# Patient Record
Sex: Male | Born: 1940 | Race: White | Hispanic: No | Marital: Married | Smoking: Never smoker
Health system: Southern US, Community
[De-identification: ages and names within clinical notes are randomized; demographics above are authoritative.]

## PROBLEM LIST (undated history)

## (undated) DIAGNOSIS — I10 Essential (primary) hypertension: Secondary | ICD-10-CM

## (undated) DIAGNOSIS — R209 Unspecified disturbances of skin sensation: Secondary | ICD-10-CM

## (undated) DIAGNOSIS — I69998 Other sequelae following unspecified cerebrovascular disease: Secondary | ICD-10-CM

## (undated) DIAGNOSIS — F411 Generalized anxiety disorder: Secondary | ICD-10-CM

## (undated) DIAGNOSIS — F3289 Other specified depressive episodes: Secondary | ICD-10-CM

## (undated) DIAGNOSIS — K219 Gastro-esophageal reflux disease without esophagitis: Secondary | ICD-10-CM

## (undated) DIAGNOSIS — J441 Chronic obstructive pulmonary disease with (acute) exacerbation: Secondary | ICD-10-CM

## (undated) DIAGNOSIS — E119 Type 2 diabetes mellitus without complications: Secondary | ICD-10-CM

## (undated) DIAGNOSIS — I639 Cerebral infarction, unspecified: Secondary | ICD-10-CM

## (undated) DIAGNOSIS — E1149 Type 2 diabetes mellitus with other diabetic neurological complication: Secondary | ICD-10-CM

## (undated) DIAGNOSIS — E785 Hyperlipidemia, unspecified: Secondary | ICD-10-CM

## (undated) DIAGNOSIS — F329 Major depressive disorder, single episode, unspecified: Secondary | ICD-10-CM

## (undated) DIAGNOSIS — I719 Aortic aneurysm of unspecified site, without rupture: Secondary | ICD-10-CM

## (undated) DIAGNOSIS — F0390 Unspecified dementia without behavioral disturbance: Secondary | ICD-10-CM

## (undated) DIAGNOSIS — H1030 Unspecified acute conjunctivitis, unspecified eye: Secondary | ICD-10-CM

## (undated) DIAGNOSIS — N189 Chronic kidney disease, unspecified: Secondary | ICD-10-CM

## (undated) DIAGNOSIS — G8100 Flaccid hemiplegia affecting unspecified side: Secondary | ICD-10-CM

## (undated) DIAGNOSIS — L89899 Pressure ulcer of other site, unspecified stage: Secondary | ICD-10-CM

## (undated) HISTORY — DX: Type 2 diabetes mellitus with other diabetic neurological complication: E11.49

---

## 2000-09-15 ENCOUNTER — Encounter: Payer: Self-pay | Admitting: Emergency Medicine

## 2000-09-15 ENCOUNTER — Inpatient Hospital Stay (HOSPITAL_COMMUNITY): Admission: EM | Admit: 2000-09-15 | Discharge: 2000-09-16 | Payer: Self-pay | Admitting: Emergency Medicine

## 2000-12-04 ENCOUNTER — Emergency Department (HOSPITAL_COMMUNITY): Admission: EM | Admit: 2000-12-04 | Discharge: 2000-12-04 | Payer: Self-pay | Admitting: *Deleted

## 2000-12-26 ENCOUNTER — Ambulatory Visit (HOSPITAL_COMMUNITY): Admission: RE | Admit: 2000-12-26 | Discharge: 2000-12-26 | Payer: Self-pay | Admitting: *Deleted

## 2000-12-26 ENCOUNTER — Encounter: Payer: Self-pay | Admitting: *Deleted

## 2001-01-06 ENCOUNTER — Encounter: Payer: Self-pay | Admitting: *Deleted

## 2001-01-08 ENCOUNTER — Encounter (INDEPENDENT_AMBULATORY_CARE_PROVIDER_SITE_OTHER): Payer: Self-pay | Admitting: *Deleted

## 2001-01-08 ENCOUNTER — Inpatient Hospital Stay (HOSPITAL_COMMUNITY): Admission: RE | Admit: 2001-01-08 | Discharge: 2001-01-09 | Payer: Self-pay | Admitting: *Deleted

## 2005-06-04 ENCOUNTER — Encounter (INDEPENDENT_AMBULATORY_CARE_PROVIDER_SITE_OTHER): Payer: Self-pay | Admitting: Interventional Cardiology

## 2005-06-04 ENCOUNTER — Inpatient Hospital Stay (HOSPITAL_COMMUNITY): Admission: EM | Admit: 2005-06-04 | Discharge: 2005-06-10 | Payer: Self-pay | Admitting: Emergency Medicine

## 2006-02-20 ENCOUNTER — Encounter: Payer: Self-pay | Admitting: Internal Medicine

## 2006-03-06 ENCOUNTER — Ambulatory Visit (HOSPITAL_COMMUNITY): Admission: RE | Admit: 2006-03-06 | Discharge: 2006-03-06 | Payer: Self-pay | Admitting: Interventional Radiology

## 2009-12-06 ENCOUNTER — Emergency Department (HOSPITAL_COMMUNITY): Admission: EM | Admit: 2009-12-06 | Discharge: 2009-12-06 | Payer: Self-pay | Admitting: Emergency Medicine

## 2009-12-06 ENCOUNTER — Other Ambulatory Visit: Payer: Self-pay | Admitting: Emergency Medicine

## 2010-03-17 ENCOUNTER — Ambulatory Visit (HOSPITAL_COMMUNITY)
Admission: RE | Admit: 2010-03-17 | Discharge: 2010-03-17 | Payer: Self-pay | Source: Home / Self Care | Attending: Internal Medicine | Admitting: Internal Medicine

## 2010-06-14 ENCOUNTER — Emergency Department (HOSPITAL_COMMUNITY): Payer: Medicare Other

## 2010-06-14 ENCOUNTER — Inpatient Hospital Stay (HOSPITAL_COMMUNITY)
Admission: EM | Admit: 2010-06-14 | Discharge: 2010-06-20 | DRG: 191 | Disposition: A | Discharge status: Skilled Nursing Facility | Payer: Medicare Other | Attending: Internal Medicine | Admitting: Internal Medicine

## 2010-06-14 ENCOUNTER — Inpatient Hospital Stay (HOSPITAL_COMMUNITY): Payer: Medicare Other

## 2010-06-14 DIAGNOSIS — M19019 Primary osteoarthritis, unspecified shoulder: Secondary | ICD-10-CM | POA: Diagnosis present

## 2010-06-14 DIAGNOSIS — I69959 Hemiplegia and hemiparesis following unspecified cerebrovascular disease affecting unspecified side: Secondary | ICD-10-CM

## 2010-06-14 DIAGNOSIS — E785 Hyperlipidemia, unspecified: Secondary | ICD-10-CM | POA: Diagnosis present

## 2010-06-14 DIAGNOSIS — J441 Chronic obstructive pulmonary disease with (acute) exacerbation: Principal | ICD-10-CM | POA: Diagnosis present

## 2010-06-14 DIAGNOSIS — R0902 Hypoxemia: Secondary | ICD-10-CM | POA: Diagnosis present

## 2010-06-14 DIAGNOSIS — F341 Dysthymic disorder: Secondary | ICD-10-CM | POA: Diagnosis present

## 2010-06-14 DIAGNOSIS — E119 Type 2 diabetes mellitus without complications: Secondary | ICD-10-CM | POA: Diagnosis present

## 2010-06-14 DIAGNOSIS — I10 Essential (primary) hypertension: Secondary | ICD-10-CM | POA: Diagnosis present

## 2010-06-14 LAB — BLOOD GAS, ARTERIAL
Acid-base deficit: 3.1 mmol/L — ABNORMAL HIGH (ref 0.0–2.0)
Drawn by: 307971
O2 Content: 4 L/min
O2 Saturation: 97.8 %
TCO2: 21.4 mmol/L (ref 0–100)
pCO2 arterial: 47.8 mmHg — ABNORMAL HIGH (ref 35.0–45.0)
pO2, Arterial: 103 mmHg — ABNORMAL HIGH (ref 80.0–100.0)

## 2010-06-14 LAB — DIFFERENTIAL
Eosinophils Absolute: 1.7 10*3/uL — ABNORMAL HIGH (ref 0.0–0.7)
Eosinophils Relative: 15 % — ABNORMAL HIGH (ref 0–5)
Lymphs Abs: 1.7 10*3/uL (ref 0.7–4.0)
Monocytes Absolute: 0.9 10*3/uL (ref 0.1–1.0)
Monocytes Relative: 8 % (ref 3–12)

## 2010-06-14 LAB — LIPID PANEL
HDL: 37 mg/dL — ABNORMAL LOW (ref >39)
LDL Cholesterol: 38 mg/dL (ref 0–99)
Triglycerides: 45 mg/dL (ref <150)
VLDL: 9 mg/dL (ref 0–40)

## 2010-06-14 LAB — POCT CARDIAC MARKERS
CKMB, poc: 2 ng/mL (ref 1.0–8.0)
Myoglobin, poc: 172 ng/mL (ref 12–200)

## 2010-06-14 LAB — BASIC METABOLIC PANEL
BUN: 25 mg/dL — ABNORMAL HIGH (ref 6–23)
Creatinine, Ser: 1.62 mg/dL — ABNORMAL HIGH (ref 0.4–1.5)
GFR calc non Af Amer: 42 mL/min — ABNORMAL LOW (ref >60)
Glucose, Bld: 97 mg/dL (ref 70–99)

## 2010-06-14 LAB — CBC
MCH: 27.3 pg (ref 26.0–34.0)
MCHC: 30.1 g/dL (ref 30.0–36.0)
MCV: 90.5 fL (ref 78.0–100.0)
Platelets: 158 10*3/uL (ref 150–400)
RDW: 14.2 % (ref 11.5–15.5)
WBC: 11.8 10*3/uL — ABNORMAL HIGH (ref 4.0–10.5)

## 2010-06-14 LAB — CARDIAC PANEL(CRET KIN+CKTOT+MB+TROPI): Relative Index: 5.9 — ABNORMAL HIGH (ref 0.0–2.5)

## 2010-06-14 LAB — CK TOTAL AND CKMB (NOT AT ARMC): Total CK: 166 U/L (ref 7–232)

## 2010-06-14 LAB — GLUCOSE, CAPILLARY
Glucose-Capillary: 170 mg/dL — ABNORMAL HIGH (ref 70–99)
Glucose-Capillary: 143 mg/dL — ABNORMAL HIGH (ref 70–99)

## 2010-06-14 LAB — HEMOGLOBIN A1C
Hgb A1c MFr Bld: 6.1 % — ABNORMAL HIGH (ref <5.7)
Mean Plasma Glucose: 128 mg/dL — ABNORMAL HIGH (ref <117)

## 2010-06-15 ENCOUNTER — Inpatient Hospital Stay (HOSPITAL_COMMUNITY): Payer: Medicare Other

## 2010-06-15 LAB — COMPREHENSIVE METABOLIC PANEL
AST: 27 U/L (ref 0–37)
Albumin: 3 g/dL — ABNORMAL LOW (ref 3.5–5.2)
BUN: 27 mg/dL — ABNORMAL HIGH (ref 6–23)
Chloride: 111 mEq/L (ref 96–112)
Creatinine, Ser: 1.39 mg/dL (ref 0.4–1.5)
GFR calc Af Amer: >60 mL/min (ref >60)
Total Bilirubin: 0.5 mg/dL (ref 0.3–1.2)
Total Protein: 5.9 g/dL — ABNORMAL LOW (ref 6.0–8.3)

## 2010-06-15 LAB — CBC
HCT: 32.5 % — ABNORMAL LOW (ref 39.0–52.0)
Hemoglobin: 9.6 g/dL — ABNORMAL LOW (ref 13.0–17.0)
RDW: 14.2 % (ref 11.5–15.5)
WBC: 18.2 10*3/uL — ABNORMAL HIGH (ref 4.0–10.5)

## 2010-06-15 LAB — GLUCOSE, CAPILLARY
Glucose-Capillary: 160 mg/dL — ABNORMAL HIGH (ref 70–99)
Glucose-Capillary: 139 mg/dL — ABNORMAL HIGH (ref 70–99)
Glucose-Capillary: 181 mg/dL — ABNORMAL HIGH (ref 70–99)

## 2010-06-15 LAB — PROTIME-INR
INR: 1.12 (ref 0.00–1.49)
Prothrombin Time: 14.6 seconds (ref 11.6–15.2)

## 2010-06-15 NOTE — H&P (Signed)
NAME:  Justin Sherman, Justin Sherman              ACCOUNT NO.:  [id number]  MEDICAL RECORD NO.:  [id number]           PATIENT TYPE:  E  LOCATION:  WLED                         FACILITY:  [facility]  PHYSICIAN:  Conley Canal, MD      DATE OF BIRTH:  06-02-1940  DATE OF ADMISSION:  06/14/2010 DATE OF DISCHARGE:                             HISTORY & PHYSICAL   PRIMARY CARE PHYSICIAN:  Lenon Curt. Chilton Si, MD  CHIEF COMPLAINT:  Shortness of breath.  HISTORY OF PRESENT ILLNESS:  [name] is a nursing home resident referred with a history of cough and shortness of breath.  The patient could not give me a meaningful history because of apparently CVA in the past, hence history is obtained from emergency room records as well as from skilled nursing facility records.  He is a 70 year old male with history of bronchial asthma, COPD, hypertension, diabetes mellitus type 2, anxiety, anemia, CVA, depression, hyperlipidemia, dysphagia, right hemiparesis related CVA, left carotid endarterectomy who comes in with history of cough and shortness of breath.  He denies any chest pain.  He could not elaborate on the history when he presented to the emergency room, he was tachycardic, heart rate 109 with rectal temperature 100.6. He was given Avelox and nebulizations and referred to hospitalist service for further management.  Per emergency room records, the patient was wheezing, hence also given Solu-Medrol.  PAST MEDICAL HISTORY: 1. Diabetes mellitus type 2. 2. Hypertension. 3. History of CVA with right-sided hemiparesis. 4. Anxiety, depression. 5. Bronchial asthma, COPD, hyperlipidemia, dysphagia.  SOCIAL HISTORY:  Nonsmoker, nondrinker and nursing home resident.  ALLERGIES:  No known drug allergies.  HOME MEDICATIONS:  Hydrogel, Celexa, Zocor, Lantus, aspirin, oxycodone, Tylenol, DuoNeb, Phenergan, Proteinex, thiamine, multivitamins.  REVIEW OF SYSTEMS:  Unremarkable except as highlighted in the history  of present illness.  FAMILY HISTORY:  The patient denies any history of chronic medical conditions.  REVIEW OF SYSTEMS:  Unremarkable except as highlighted in the history of present illness.  PHYSICAL EXAMINATION:  GENERAL:  This is a frail elderly male not in acute distress. VITAL SIGNS:  Blood pressure 124/78, heart rate 102, temperature 100.6, respirations 24, oxygen saturation is 99% on 2 liters nasal cannula. HEAD, EARS, NOSE AND THROAT:  Pupils equal reacting to light.  No jugular venous distention.  Old surgical scar, right cervical area. RESPIRATORY SYSTEM:  Reduced air entry bilaterally with scattered wheezing. CARDIOVASCULAR SYSTEM:  First and heart sounds heard.  No murmurs. Pulse regular. ABDOMEN:  Scaphoid, soft, nontender.  No palpable organomegaly.  Bowel sounds are normal. CNS:  The patient has speech garbled.  Follows commands. EXTREMITIES:  No pedal edema.  Peripheral pulses equal.  LABORATORY DATA:  Reviewed significant for WBC 11.8, hemoglobin 11.2, hematocrit 37.2, platelet count 158.  Sodium 140, potassium 4.2, BUN 25,creatinine 1.62.  Chest x-ray shows hyperinflation with no acute findings.  EKG shows sinus tachycardia with first-degree AV block and some left fascicular block.  IMPRESSION:  A 70 year old nursing home resident presenting with shortness of breath and cough with suggestion of chronic obstructive pulmonary disease exacerbation.  He could have early healthcare associated pneumonia marked  by dehydration who would then worry for nosocomial organisms.  PLAN: 1. Acute exacerbation of chronic obstructive pulmonary disease versus     healthcare-associated pneumonia.  We will admit the patient regular     medicine of bronchodilators, oxygen supplementation, systemic     steroids.  Meanwhile, we will place the patient on vancomycin and     Levaquin to cover for MRSA and Pseudomonas. 2. Hypertension.  The patient apparently not on medications.  We  will     start calcium-channel blocker. 3. Diabetes mellitus, seems well controlled.  We will place him on low-     dose Lantus and sliding-scale insulin.  Expect uncontrolled sugars     with steroids on board. 4. History of cerebrovascular accident.  We will consult Physical     Therapy. 5. Depression.  Plan to resume home medications once confirmed. 6. DVT, GI prophylaxis.  The patient's condition is guarded.     Conley Canal, MD     SR/MEDQ  D:  06/14/2010  T:  06/14/2010  Job:  [id number]  cc:   Lenon Curt Chilton Si, M.D. Fax: [phone]  Electronically Signed by Conley Canal  on 06/14/2010 07:19:03 PM

## 2010-06-16 LAB — CULTURE, BLOOD (ROUTINE X 2)

## 2010-06-16 LAB — BASIC METABOLIC PANEL
BUN: 34 mg/dL — ABNORMAL HIGH (ref 6–23)
Calcium: 9.1 mg/dL (ref 8.4–10.5)
Creatinine, Ser: 1.46 mg/dL (ref 0.4–1.5)
GFR calc Af Amer: 57 mL/min — ABNORMAL LOW (ref >60)

## 2010-06-16 LAB — GLUCOSE, CAPILLARY
Glucose-Capillary: 146 mg/dL — ABNORMAL HIGH (ref 70–99)
Glucose-Capillary: 190 mg/dL — ABNORMAL HIGH (ref 70–99)

## 2010-06-17 LAB — BASIC METABOLIC PANEL
BUN: 41 mg/dL — ABNORMAL HIGH (ref 6–23)
Calcium: 8.9 mg/dL (ref 8.4–10.5)
GFR calc non Af Amer: 47 mL/min — ABNORMAL LOW (ref >60)
Glucose, Bld: 158 mg/dL — ABNORMAL HIGH (ref 70–99)
Potassium: 3.7 mEq/L (ref 3.5–5.1)
Sodium: 139 mEq/L (ref 135–145)

## 2010-06-17 LAB — VANCOMYCIN, TROUGH
Vancomycin Tr: 19.5 ug/mL (ref 10.0–20.0)
Vancomycin Tr: 22.4 ug/mL — ABNORMAL HIGH (ref 10.0–20.0)

## 2010-06-17 LAB — GLUCOSE, CAPILLARY: Glucose-Capillary: 188 mg/dL — ABNORMAL HIGH (ref 70–99)

## 2010-06-18 LAB — GLUCOSE, CAPILLARY
Glucose-Capillary: 184 mg/dL — ABNORMAL HIGH (ref 70–99)
Glucose-Capillary: 157 mg/dL — ABNORMAL HIGH (ref 70–99)
Glucose-Capillary: 239 mg/dL — ABNORMAL HIGH (ref 70–99)
Glucose-Capillary: 166 mg/dL — ABNORMAL HIGH (ref 70–99)
Glucose-Capillary: 172 mg/dL — ABNORMAL HIGH (ref 70–99)

## 2010-06-18 LAB — BASIC METABOLIC PANEL
Chloride: 108 mEq/L (ref 96–112)
GFR calc non Af Amer: 52 mL/min — ABNORMAL LOW (ref >60)
Potassium: 4.3 mEq/L (ref 3.5–5.1)
Sodium: 139 mEq/L (ref 135–145)

## 2010-06-19 LAB — GLUCOSE, CAPILLARY: Glucose-Capillary: 190 mg/dL — ABNORMAL HIGH (ref 70–99)

## 2010-06-20 LAB — CBC
Hemoglobin: 10.4 g/dL — ABNORMAL LOW (ref 13.0–17.0)
MCHC: 30.4 g/dL (ref 30.0–36.0)
RBC: 3.87 MIL/uL — ABNORMAL LOW (ref 4.22–5.81)
WBC: 12.2 10*3/uL — ABNORMAL HIGH (ref 4.0–10.5)

## 2010-06-20 LAB — GLUCOSE, CAPILLARY
Glucose-Capillary: 135 mg/dL — ABNORMAL HIGH (ref 70–99)
Glucose-Capillary: 173 mg/dL — ABNORMAL HIGH (ref 70–99)

## 2010-06-20 LAB — CULTURE, BLOOD (ROUTINE X 2)
Culture  Setup Time: 201203070421
Culture: NO GROWTH

## 2010-06-22 LAB — POCT I-STAT, CHEM 8
Calcium, Ion: 1.29 mmol/L (ref 1.12–1.32)
Glucose, Bld: 125 mg/dL — ABNORMAL HIGH (ref 70–99)
HCT: 38 % — ABNORMAL LOW (ref 39.0–52.0)
Hemoglobin: 12.9 g/dL — ABNORMAL LOW (ref 13.0–17.0)
Potassium: 3.8 mEq/L (ref 3.5–5.1)

## 2010-06-22 LAB — CBC
MCV: 91 fL (ref 78.0–100.0)
Platelets: 162 10*3/uL (ref 150–400)
RDW: 13.8 % (ref 11.5–15.5)
WBC: 10.6 10*3/uL — ABNORMAL HIGH (ref 4.0–10.5)

## 2010-06-22 LAB — POCT CARDIAC MARKERS
CKMB, poc: 1 ng/mL (ref 1.0–8.0)
Troponin i, poc: <0.05 ng/mL (ref 0.00–0.09)

## 2010-06-22 LAB — DIFFERENTIAL
Basophils Absolute: 0.1 10*3/uL (ref 0.0–0.1)
Eosinophils Absolute: 1 10*3/uL — ABNORMAL HIGH (ref 0.0–0.7)
Eosinophils Relative: 9 % — ABNORMAL HIGH (ref 0–5)
Lymphocytes Relative: 24 % (ref 12–46)
Neutrophils Relative %: 60 % (ref 43–77)

## 2010-06-22 LAB — PROTIME-INR: Prothrombin Time: 12.1 seconds (ref 11.6–15.2)

## 2010-07-06 NOTE — Discharge Summary (Signed)
Justin Sherman, Justin Sherman              ACCOUNT NO.:  [id number]  MEDICAL RECORD NO.:  [id number]           PATIENT TYPE:  I  LOCATION:  1414                         FACILITY:  [facility]  PHYSICIAN:  Kela Millin, M.D.DATE OF BIRTH:  [birth year]  DATE OF ADMISSION:  06/14/2010 DATE OF DISCHARGE:  06/20/2010                        DISCHARGE SUMMARY - REFERRING   DISCHARGE DIAGNOSES: 1. Chronic obstructive pulmonary disease exacerbation. 2. Anxiety/depression. 3. Hypertension. 4. Diabetes mellitus. 5. History of cerebrovascular accident. 6. 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 7. Hyperlipidemia. 8. History of bronchial asthma. 9. History of dysphagia.  PROCEDURES AND STUDIES: 1. Chest x-ray on June 14, 2010 - no acute findings. 2. Followup chest x-ray on June 14, 2010 - no active disease as one     view. 3. Chest x-ray on June 15, 2010 - no acute cardiopulmonary process.  CONSULTATIONS:  None.  BRIEF HISTORY:  The patient is a pleasant 70 year old white male nursing home resident who presented with complaints of cough and shortness of breath.  The history was obtained from chart review as the patient was not able to give a good history due to his CVA in the past.  Per ED records, upon arrival he was short of breath and tachycardic and he was given nebulized bronchodilators.  He was also found to be wheezing on exam and he was started on IV Solu-Medrol.  Chest x-ray was done and the result as stated above with no acute infiltrates and he was admitted for further evaluation and management.  HOSPITAL COURSE BY PROBLEMS: 1. COPD exacerbation.  Upon admission, the patient was started on     nebulized bronchodilators as well as IV steroids and antibiotics as     well as supplemental oxygen.  His symptoms were slow to respond and     the patient also had several episodes of anxiety attacks that seem     to precipitate his episodes of shortness of breath.  He was  maintained on IV Solu-Medrol along with bronchodilators and     antibiotics and low-dose Ativan was added and gradually his     symptoms improved.  He has remained afebrile.  His white cell count     today is 12.2 and the steroids he is on is probably contributing to     this.  He was changed to oral steroids and his symptoms have     continued to improve and he will be discharged at this time on oral     antibiotics along with a prednisone taper and he is to continue     supplemental oxygen upon discharge and follow up with the nursing     home physician. 2. Anxiety/depression.  He was maintained on Celexa.  His dose was     decreased to 20 mg daily per FDA recommendations and was also     placed on low-dose Ativan p.r.n.  He is to continue this upon     discharge. 3. Diabetes mellitus.  His Accu-Cheks were monitored and he was     covered with sliding scale insulin as well and he was also on  Lantus.  He is to continue the Lantus upon discharge. 4. Hypertension.  He was placed on Norvasc during this hospital stay     and is to continue it upon discharge. 5. History of CVA.  PT/OT was consulted and followed the patient in     the hospital and they recommended for him to continue rehab at the     nursing facility upon discharge.  DISCHARGE MEDICATIONS: 1. Norvasc 5 mg p.o. daily. 2. Mucinex 1 tablet p.o. b.i.d. 3. Levaquin 1 p.o. daily for 3 more days. 4. Protonix 40 mg p.o. daily. 5. Prednisone taper as directed. 6. Ambien 5 mg q.h.s. p.r.n. 7. Celexa 20 mg p.o. q.h.s. 8. Ipratropium/albuterol nebs q.6 h and q.3-4 h p.r.n. 9. Lantus 23 units subcu q.h.s. 10.Multivitamin 1 p.o. daily. 11.Oxycodone 5 mg 2 tablets q.4 h p.r.n. as previously and 5 mg 2     tablets q. a.m. as previously. 12.Phenergan 25 mg q.6 h p.r.n. 13.Proteinex 30 cc p.o. daily. 14.Thiamine 100 mg p.o. daily. 15.Tylenol 2 tablets q.6 h p.r.n. 16.Zocor 40 mg p.o. q.h.s.  FOLLOWUP CARE:  Nursing home  physician in 1 to 2 days.     Kela Millin, M.D.     ACV/MEDQ  D:  06/20/2010  T:  06/20/2010  Job:  [id number]  cc:   Lenon Curt. Chilton Si, M.D. Fax: [phone]  Electronically Signed by Donnalee Curry M.D. on 07/06/2010 10:37:25 AM

## 2010-07-11 ENCOUNTER — Inpatient Hospital Stay (HOSPITAL_COMMUNITY)
Admission: EM | Admit: 2010-07-11 | Discharge: 2010-07-16 | DRG: 871 | Disposition: A | Discharge status: Skilled Nursing Facility | Payer: Medicare Other | Attending: Internal Medicine | Admitting: Internal Medicine

## 2010-07-11 ENCOUNTER — Emergency Department (HOSPITAL_COMMUNITY): Payer: Medicare Other

## 2010-07-11 ENCOUNTER — Other Ambulatory Visit (HOSPITAL_COMMUNITY): Payer: Medicare Other

## 2010-07-11 ENCOUNTER — Inpatient Hospital Stay (HOSPITAL_COMMUNITY): Payer: Medicare Other

## 2010-07-11 DIAGNOSIS — N390 Urinary tract infection, site not specified: Secondary | ICD-10-CM | POA: Diagnosis present

## 2010-07-11 DIAGNOSIS — E119 Type 2 diabetes mellitus without complications: Secondary | ICD-10-CM | POA: Diagnosis present

## 2010-07-11 DIAGNOSIS — N179 Acute kidney failure, unspecified: Secondary | ICD-10-CM | POA: Diagnosis present

## 2010-07-11 DIAGNOSIS — Z7982 Long term (current) use of aspirin: Secondary | ICD-10-CM

## 2010-07-11 DIAGNOSIS — J13 Pneumonia due to Streptococcus pneumoniae: Secondary | ICD-10-CM

## 2010-07-11 DIAGNOSIS — I69959 Hemiplegia and hemiparesis following unspecified cerebrovascular disease affecting unspecified side: Secondary | ICD-10-CM

## 2010-07-11 DIAGNOSIS — I129 Hypertensive chronic kidney disease with stage 1 through stage 4 chronic kidney disease, or unspecified chronic kidney disease: Secondary | ICD-10-CM | POA: Diagnosis present

## 2010-07-11 DIAGNOSIS — D649 Anemia, unspecified: Secondary | ICD-10-CM | POA: Diagnosis present

## 2010-07-11 DIAGNOSIS — B964 Proteus (mirabilis) (morganii) as the cause of diseases classified elsewhere: Secondary | ICD-10-CM | POA: Diagnosis present

## 2010-07-11 DIAGNOSIS — J9600 Acute respiratory failure, unspecified whether with hypoxia or hypercapnia: Secondary | ICD-10-CM | POA: Diagnosis present

## 2010-07-11 DIAGNOSIS — R6521 Severe sepsis with septic shock: Secondary | ICD-10-CM

## 2010-07-11 DIAGNOSIS — A419 Sepsis, unspecified organism: Secondary | ICD-10-CM

## 2010-07-11 DIAGNOSIS — N139 Obstructive and reflux uropathy, unspecified: Secondary | ICD-10-CM | POA: Diagnosis present

## 2010-07-11 DIAGNOSIS — D696 Thrombocytopenia, unspecified: Secondary | ICD-10-CM | POA: Diagnosis present

## 2010-07-11 DIAGNOSIS — R1310 Dysphagia, unspecified: Secondary | ICD-10-CM | POA: Diagnosis present

## 2010-07-11 DIAGNOSIS — I499 Cardiac arrhythmia, unspecified: Secondary | ICD-10-CM | POA: Diagnosis not present

## 2010-07-11 DIAGNOSIS — E46 Unspecified protein-calorie malnutrition: Secondary | ICD-10-CM | POA: Diagnosis not present

## 2010-07-11 DIAGNOSIS — IMO0002 Reserved for concepts with insufficient information to code with codable children: Secondary | ICD-10-CM | POA: Diagnosis present

## 2010-07-11 DIAGNOSIS — R7881 Bacteremia: Secondary | ICD-10-CM | POA: Diagnosis present

## 2010-07-11 DIAGNOSIS — N182 Chronic kidney disease, stage 2 (mild): Secondary | ICD-10-CM | POA: Diagnosis present

## 2010-07-11 DIAGNOSIS — J441 Chronic obstructive pulmonary disease with (acute) exacerbation: Secondary | ICD-10-CM | POA: Diagnosis not present

## 2010-07-11 DIAGNOSIS — R6520 Severe sepsis without septic shock: Secondary | ICD-10-CM

## 2010-07-11 DIAGNOSIS — E785 Hyperlipidemia, unspecified: Secondary | ICD-10-CM | POA: Diagnosis present

## 2010-07-11 DIAGNOSIS — Z794 Long term (current) use of insulin: Secondary | ICD-10-CM

## 2010-07-11 LAB — CK TOTAL AND CKMB (NOT AT ARMC)
CK, MB: 3.5 ng/mL (ref 0.3–4.0)
Relative Index: 0.8 (ref 0.0–2.5)
Total CK: 428 U/L — ABNORMAL HIGH (ref 7–232)

## 2010-07-11 LAB — POCT I-STAT 3, ART BLOOD GAS (G3+)
TCO2: 19 mmol/L (ref 0–100)
pCO2 arterial: 32.3 mmHg — ABNORMAL LOW (ref 35.0–45.0)
pH, Arterial: 7.36 (ref 7.350–7.450)

## 2010-07-11 LAB — BASIC METABOLIC PANEL
CO2: 19 mEq/L (ref 19–32)
Calcium: 7.4 mg/dL — ABNORMAL LOW (ref 8.4–10.5)
Chloride: 111 mEq/L (ref 96–112)
Creatinine, Ser: 3.05 mg/dL — ABNORMAL HIGH (ref 0.4–1.5)
Creatinine, Ser: 2.88 mg/dL — ABNORMAL HIGH (ref 0.4–1.5)
GFR calc Af Amer: 25 mL/min — ABNORMAL LOW (ref >60)
GFR calc Af Amer: 26 mL/min — ABNORMAL LOW (ref >60)
GFR calc non Af Amer: 20 mL/min — ABNORMAL LOW (ref >60)
Glucose, Bld: 96 mg/dL (ref 70–99)
Sodium: 136 mEq/L (ref 135–145)

## 2010-07-11 LAB — HAPTOGLOBIN: Haptoglobin: 266 mg/dL — ABNORMAL HIGH (ref 16–200)

## 2010-07-11 LAB — COMPREHENSIVE METABOLIC PANEL
ALT: 26 U/L (ref 0–53)
AST: 32 U/L (ref 0–37)
CO2: 21 mEq/L (ref 19–32)
Calcium: 8.5 mg/dL (ref 8.4–10.5)
Chloride: 101 mEq/L (ref 96–112)
GFR calc Af Amer: 23 mL/min — ABNORMAL LOW (ref >60)
GFR calc non Af Amer: 19 mL/min — ABNORMAL LOW (ref >60)
Sodium: 133 mEq/L — ABNORMAL LOW (ref 135–145)

## 2010-07-11 LAB — URINE MICROSCOPIC-ADD ON

## 2010-07-11 LAB — URINALYSIS, ROUTINE W REFLEX MICROSCOPIC
Bilirubin Urine: NEGATIVE
Protein, ur: 100 mg/dL — AB
Urobilinogen, UA: 0.2 mg/dL (ref 0.0–1.0)

## 2010-07-11 LAB — DIFFERENTIAL
Basophils Absolute: 0 10*3/uL (ref 0.0–0.1)
Lymphocytes Relative: 4 % — ABNORMAL LOW (ref 12–46)
Neutro Abs: 9.7 10*3/uL — ABNORMAL HIGH (ref 1.7–7.7)
Neutrophils Relative %: 90 % — ABNORMAL HIGH (ref 43–77)

## 2010-07-11 LAB — CARDIAC PANEL(CRET KIN+CKTOT+MB+TROPI)
CK, MB: 4.3 ng/mL — ABNORMAL HIGH (ref 0.3–4.0)
Total CK: 476 U/L — ABNORMAL HIGH (ref 7–232)

## 2010-07-11 LAB — APTT: aPTT: 39 seconds — ABNORMAL HIGH (ref 24–37)

## 2010-07-11 LAB — PHOSPHORUS: Phosphorus: 2.9 mg/dL (ref 2.3–4.6)

## 2010-07-11 LAB — CBC
HCT: 29.7 % — ABNORMAL LOW (ref 39.0–52.0)
Hemoglobin: 9.7 g/dL — ABNORMAL LOW (ref 13.0–17.0)
Hemoglobin: 8.8 g/dL — ABNORMAL LOW (ref 13.0–17.0)
RBC: 3.16 MIL/uL — ABNORMAL LOW (ref 4.22–5.81)
RDW: 16.3 % — ABNORMAL HIGH (ref 11.5–15.5)
WBC: 10.7 10*3/uL — ABNORMAL HIGH (ref 4.0–10.5)

## 2010-07-11 LAB — LACTATE DEHYDROGENASE
LDH: 173 U/L (ref 94–250)
LDH: 183 U/L (ref 94–250)

## 2010-07-11 LAB — TECHNOLOGIST SMEAR REVIEW

## 2010-07-11 LAB — GLUCOSE, CAPILLARY
Glucose-Capillary: 114 mg/dL — ABNORMAL HIGH (ref 70–99)
Glucose-Capillary: 189 mg/dL — ABNORMAL HIGH (ref 70–99)

## 2010-07-11 LAB — CARBOXYHEMOGLOBIN
Carboxyhemoglobin: 1.1 % (ref 0.5–1.5)
Methemoglobin: 0.5 % (ref 0.0–1.5)

## 2010-07-11 LAB — LACTIC ACID, PLASMA: Lactic Acid, Venous: 3.8 mmol/L — ABNORMAL HIGH (ref 0.5–2.2)

## 2010-07-11 LAB — D-DIMER, QUANTITATIVE
D-Dimer, Quant: 3.11 ug/mL-FEU — ABNORMAL HIGH (ref 0.00–0.48)
D-Dimer, Quant: 5.23 ug/mL-FEU — ABNORMAL HIGH (ref 0.00–0.48)

## 2010-07-11 LAB — PROTIME-INR
INR: 1.39 (ref 0.00–1.49)
Prothrombin Time: 17.3 seconds — ABNORMAL HIGH (ref 11.6–15.2)

## 2010-07-11 LAB — ABO/RH: ABO/RH(D): O POS

## 2010-07-12 ENCOUNTER — Inpatient Hospital Stay (HOSPITAL_COMMUNITY): Payer: Medicare Other

## 2010-07-12 DIAGNOSIS — N179 Acute kidney failure, unspecified: Secondary | ICD-10-CM

## 2010-07-12 DIAGNOSIS — R6521 Severe sepsis with septic shock: Secondary | ICD-10-CM

## 2010-07-12 DIAGNOSIS — J13 Pneumonia due to Streptococcus pneumoniae: Secondary | ICD-10-CM

## 2010-07-12 DIAGNOSIS — A419 Sepsis, unspecified organism: Secondary | ICD-10-CM

## 2010-07-12 LAB — GLUCOSE, CAPILLARY
Glucose-Capillary: 201 mg/dL — ABNORMAL HIGH (ref 70–99)
Glucose-Capillary: 175 mg/dL — ABNORMAL HIGH (ref 70–99)
Glucose-Capillary: 205 mg/dL — ABNORMAL HIGH (ref 70–99)
Glucose-Capillary: 213 mg/dL — ABNORMAL HIGH (ref 70–99)
Glucose-Capillary: 322 mg/dL — ABNORMAL HIGH (ref 70–99)

## 2010-07-12 LAB — DIFFERENTIAL
Eosinophils Absolute: 0 10*3/uL (ref 0.0–0.7)
Lymphocytes Relative: 3 % — ABNORMAL LOW (ref 12–46)
Lymphs Abs: 0.6 10*3/uL — ABNORMAL LOW (ref 0.7–4.0)
Monocytes Relative: 3 % (ref 3–12)
Neutrophils Relative %: 94 % — ABNORMAL HIGH (ref 43–77)

## 2010-07-12 LAB — RENAL FUNCTION PANEL
Albumin: 2.1 g/dL — ABNORMAL LOW (ref 3.5–5.2)
BUN: 35 mg/dL — ABNORMAL HIGH (ref 6–23)
CO2: 22 mEq/L (ref 19–32)
Chloride: 112 mEq/L (ref 96–112)
Creatinine, Ser: 2.32 mg/dL — ABNORMAL HIGH (ref 0.4–1.5)
Glucose, Bld: 204 mg/dL — ABNORMAL HIGH (ref 70–99)

## 2010-07-12 LAB — BASIC METABOLIC PANEL
BUN: 35 mg/dL — ABNORMAL HIGH (ref 6–23)
CO2: 22 mEq/L (ref 19–32)
CO2: 22 mEq/L (ref 19–32)
Chloride: 113 mEq/L — ABNORMAL HIGH (ref 96–112)
Chloride: 114 mEq/L — ABNORMAL HIGH (ref 96–112)
Creatinine, Ser: 2.52 mg/dL — ABNORMAL HIGH (ref 0.4–1.5)
GFR calc Af Amer: 37 mL/min — ABNORMAL LOW (ref >60)
Glucose, Bld: 180 mg/dL — ABNORMAL HIGH (ref 70–99)
Potassium: 3.8 mEq/L (ref 3.5–5.1)
Sodium: 142 mEq/L (ref 135–145)

## 2010-07-12 LAB — CBC
HCT: 26.9 % — ABNORMAL LOW (ref 39.0–52.0)
MCH: 27.9 pg (ref 26.0–34.0)
MCV: 85.4 fL (ref 78.0–100.0)
Platelets: 56 10*3/uL — ABNORMAL LOW (ref 150–400)
RBC: 3.15 MIL/uL — ABNORMAL LOW (ref 4.22–5.81)

## 2010-07-13 ENCOUNTER — Inpatient Hospital Stay (HOSPITAL_COMMUNITY): Payer: Medicare Other

## 2010-07-13 LAB — CBC
HCT: 24.6 % — ABNORMAL LOW (ref 39.0–52.0)
Hemoglobin: 8.1 g/dL — ABNORMAL LOW (ref 13.0–17.0)
MCH: 27.8 pg (ref 26.0–34.0)
MCHC: 32.9 g/dL (ref 30.0–36.0)
RDW: 17.1 % — ABNORMAL HIGH (ref 11.5–15.5)

## 2010-07-13 LAB — CARDIAC PANEL(CRET KIN+CKTOT+MB+TROPI)
CK, MB: 2.6 ng/mL (ref 0.3–4.0)
Relative Index: INVALID (ref 0.0–2.5)
Total CK: 56 U/L (ref 7–232)
Troponin I: 0.04 ng/mL (ref 0.00–0.06)

## 2010-07-13 LAB — BASIC METABOLIC PANEL
CO2: 23 mEq/L (ref 19–32)
Calcium: 8.2 mg/dL — ABNORMAL LOW (ref 8.4–10.5)
Creatinine, Ser: 1.95 mg/dL — ABNORMAL HIGH (ref 0.4–1.5)
GFR calc non Af Amer: 34 mL/min — ABNORMAL LOW (ref >60)
Glucose, Bld: 241 mg/dL — ABNORMAL HIGH (ref 70–99)
Sodium: 140 mEq/L (ref 135–145)

## 2010-07-13 LAB — GLUCOSE, CAPILLARY
Glucose-Capillary: 195 mg/dL — ABNORMAL HIGH (ref 70–99)
Glucose-Capillary: 158 mg/dL — ABNORMAL HIGH (ref 70–99)

## 2010-07-14 LAB — BASIC METABOLIC PANEL
CO2: 23 mEq/L (ref 19–32)
Calcium: 8.7 mg/dL (ref 8.4–10.5)
Chloride: 110 mEq/L (ref 96–112)
GFR calc Af Amer: 47 mL/min — ABNORMAL LOW (ref >60)
Glucose, Bld: 85 mg/dL (ref 70–99)
Potassium: 4.1 mEq/L (ref 3.5–5.1)
Sodium: 137 mEq/L (ref 135–145)

## 2010-07-14 LAB — CARDIAC PANEL(CRET KIN+CKTOT+MB+TROPI)
CK, MB: 2.8 ng/mL (ref 0.3–4.0)
CK, MB: 2.1 ng/mL (ref 0.3–4.0)
Relative Index: INVALID (ref 0.0–2.5)
Total CK: 56 U/L (ref 7–232)
Troponin I: 0.04 ng/mL (ref 0.00–0.06)

## 2010-07-14 LAB — CULTURE, BLOOD (ROUTINE X 2): Culture  Setup Time: 201204030903

## 2010-07-14 LAB — GLUCOSE, CAPILLARY: Glucose-Capillary: 137 mg/dL — ABNORMAL HIGH (ref 70–99)

## 2010-07-14 LAB — URINE CULTURE
Colony Count: 1000
Culture  Setup Time: 201204032036

## 2010-07-14 LAB — CBC
HCT: 25.9 % — ABNORMAL LOW (ref 39.0–52.0)
Hemoglobin: 8.5 g/dL — ABNORMAL LOW (ref 13.0–17.0)
MCHC: 32.8 g/dL (ref 30.0–36.0)
RBC: 3.06 MIL/uL — ABNORMAL LOW (ref 4.22–5.81)
WBC: 12 10*3/uL — ABNORMAL HIGH (ref 4.0–10.5)

## 2010-07-14 LAB — VITAMIN B12: Vitamin B-12: 749 pg/mL (ref 211–911)

## 2010-07-14 LAB — IRON AND TIBC: UIBC: 148 ug/dL

## 2010-07-14 LAB — FOLATE: Folate: 12.3 ng/mL

## 2010-07-15 LAB — CBC
Hemoglobin: 8.4 g/dL — ABNORMAL LOW (ref 13.0–17.0)
MCH: 27.4 pg (ref 26.0–34.0)
MCHC: 32.6 g/dL (ref 30.0–36.0)
MCV: 84 fL (ref 78.0–100.0)
RBC: 3.07 MIL/uL — ABNORMAL LOW (ref 4.22–5.81)

## 2010-07-15 LAB — BASIC METABOLIC PANEL
BUN: 23 mg/dL (ref 6–23)
CO2: 25 mEq/L (ref 19–32)
Calcium: 8.9 mg/dL (ref 8.4–10.5)
Chloride: 103 mEq/L (ref 96–112)
GFR calc Af Amer: 49 mL/min — ABNORMAL LOW (ref >60)
GFR calc non Af Amer: 40 mL/min — ABNORMAL LOW (ref >60)
Glucose, Bld: 96 mg/dL (ref 70–99)

## 2010-07-15 LAB — GLUCOSE, CAPILLARY
Glucose-Capillary: 97 mg/dL (ref 70–99)
Glucose-Capillary: 119 mg/dL — ABNORMAL HIGH (ref 70–99)

## 2010-07-16 LAB — GLUCOSE, CAPILLARY: Glucose-Capillary: 110 mg/dL — ABNORMAL HIGH (ref 70–99)

## 2010-07-17 NOTE — Discharge Summary (Signed)
NAME:  Justin Sherman, Justin Sherman NO.:  [id number]  MEDICAL RECORD NO.:  [id number]           PATIENT TYPE:  I  LOCATION:  4743                         FACILITY:  [facility]  PHYSICIAN:  Andreas Blower, MD       DATE OF BIRTH:  1940-10-22  DATE OF ADMISSION:  07/11/2010 DATE OF DISCHARGE:                        DISCHARGE SUMMARY - REFERRING   PRIMARY CARE PHYSICIAN:  Dr. Chilton Si.  DISCHARGE DIAGNOSES: 1. Sepsis from urinary tract infection. 2. Proteus mirabilis infection urinary tract infection. 3. Proteus mirabilis bactermia 4. Thrombocytopenia. 5. Acute renal failure and chronic kidney disease stage II due to     obstructive uropathy, resolved. 6. Urethral stricture and obstructive uropathy status post urethral     dilatation. 7. Acute respiratory distress from sepsis, resolved. 8. Diabetes. 9. Anemia. 10. Hyperlipidemia. 11.History of cerebrovascular accident with right-sided deficits. 12.History of dysphagia for cerebrovascular accident, stable. 13.Hypertension. 14.Few episodes of arrhythmia to suggest possible Mobitz type 2.  DISCHARGE MEDICATIONS: 1. Cefuroxime 500 mg p.o. twice daily to be continued until July 21, 2010. 2. NovoLog sliding scale 1-15 units subcu 3 times a day with meals. 3. Lantus 5 units subcu daily at bedtime. 4. Aspirin 81 mg p.o. daily 5. Celexa 20 mg p.o. daily at bedtime. 6. Guaifenesin XR 600 mg p.o. twice daily. 7. Ipratropium and albuterol nebulizer every 6 hours. 8. Lorazepam 0.5 mg daily as needed. 9. Multivitamin 1 tablet p.o. daily. 10.Neosporin topical 1 application twice daily as needed. 11.Oxycodone 20 mg every morning and oxycodone 10 mg every 4 hours as     needed. 12.Pantoprazole 40 mg p.o. daily. 13.Phenergan 25 mg every 6 hours as needed. 14.Amino acids protein hydrolysate 30 mL p.o. daily. 15.Thiamine B1 100 mg 1 tablet p.o. daily. 16.Acetaminophen 650 mg every 6 hours as needed. 17.Simvastatin 40 mg daily at  bedtime. 18.Zolpidem 5 mg daily at bedtime as needed. 19.The following medications were discontinued Lantus 23 units subcu     daily at bedtime, amlodipine 5 mg p.o. daily.  BRIEF ADMITTING HISTORY AND PHYSICAL:  Mr. Brayboy is a 70 year old gentleman with history of CVA who presented on July 11, 2010, with severe sepsis, urinary tract infections and respiratory failure.  RADIOLOGY/IMAGING:  The patient had portable chest x-ray on July 11, 2010, which showed linear atelectasis in the right midlung with some elevation of the right hemidiaphragm. The patient had a head CT without contrast which shows chronic left MCA territory infarct.  No acute intracranial findings.  The patient had a renal ultrasound on July 11, 2010, which shows no acute findings, nephrolithiasis or renal vascular calcifications noted. The patient had another portable chest x-ray on July 13, 2010, which showed interstitial edema.  CONSULTATIONS:  The patient was initially on pulmonary critical care service prior to being transferred to the hospitalist service.  Urology, Dr. [name] was consulted.  LABORATORY DATA:  CBC shows a white count of 9.9, hemoglobin 8.4, hematocrit 25.8, platelet count 104, electrolytes normal with a creatinine of 1.69.  Troponin was negative x3.  Serum iron was 76, TIBC was 224, percent saturation 34, UIBC 148, vitamin B12 was 749.  Serum folate was 12.3.  Urine culture grew Proteus mirabilis that resistant to ciprofloxacin, sensitive to ceftriaxone.  Blood cultures x2 grew Proteus mirabilis.  HOSPITAL COURSE BY PROBLEM: 1. Sepsis due to UTI and bacteremia.  The patient was initially     admitted to the ICU, was started on the sepsis protocol.  He     also had right IJ placed and was aggressively hydrated and was     started on pressors.  He was also started on broad-spectrum     antibiotics, initially vancomycin and Zosyn.  During the course     of hospital stay, based on the  sensitivities of the blood     cultures and the urine culture, his antibiotics transitioned     to ceftriaxone, then to cefuroxime.  He will continue     antibiotics for 5 more days to complete a 10-day course of     antibiotics. 2. Proteus mirabilis urinary tract infection and bacteremia.  The     Proteus mirabilis was resistant to fluoroquinolone.  As a result,     initially the patient was on Zosyn which was transitioned to     ciprofloxacin, which was discontinued, will be on cefuroxime for     5 more days. 3. Thrombocytopenia, likely due to sepsis, improved during the course     of the hospital stay. 4. Acute renal failure secondary to sepsis and obstructive uropathy.     The patient had a Foley catheter placed by Dr. [name]. 5. Urethral stricture and obstructive uropathy.  The patient had a     Foley catheter placed on July 11, 2010.  His Foley was discontinued     on July 14, 2010.  Since then the patient has had voided with 3     separate bladder scans which showed less than 160 mL postvoid     residual. 6. Acute respiratory failure secondary to sepsis, improved during the     course of the hospital stay. 7. Diabetes.  During the course of the hospital stay, the patient's     blood sugars were stable.  As a result the patient was taken off of     his home dose of Lantus and at discharge will be only on 5 units     subcu nightly.  Further titration of Lantus to be done as an     outpatient. 8. Anemia due to critical illness.  Hemoglobin has been stable during     the course of the hospital stay.  Iron panel do not suggest iron-     deficiency anemia. 9. Hyperlipidemia.  Continue the patient on statin. 10.History of CVA with right-sided deficits and dysphagia, stable. 11.Hypertension.  Blood pressure at the time of discharge was 101/61.     As a result the patient had his amlodipine discontinued.  Further     titration of antihypertensive medications to be done as an      outpatient. 12.Arrhythmia.  During the course of the hospital stay the patient has     had few episodes of missed beats.  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  I spoke with Dr. Dietrich Pates, cardiologist who     recommended that on Monday that I take the rhythm strips to one of     the electrophysiologist and determine when needs to be done.  I     will speak with one of the electrophysiologist and follow up on  this issue.  Dr. Dietrich Pates indicated that the patient could be     discharged and have this managed as an outpatient given the patient     was nonsymptomatic.  DISPOSITION AND FOLLOWUP:  The patient is to follow with Dr. Chilton Si, his primary care physician in 1 week.  The patient may need outpatient cardiac evaluation based on my discussion with electrophysiologist.  Addendum: I spoke with Dr. Clide Cliff, electrophysiologist. Dr. Clide Cliff thought that the patient had beats that looked Mobitz I and had other beats appeared to be Mobitz II. Given the low likely hood to have both Mobitz II and Mobitz I, he thought that based on the rythm stips the patient had Mobitz I (Wenckebach), did not recommend any further workup.  Time spent on discharge talking to the patient and coordinating care was 35 minutes.   Andreas Blower, MD   SR/MEDQ  D:  07/16/2010  T:  07/16/2010  Job:  [id number]  Electronically Signed by Wardell Heath Keyaan Lederman  on 07/17/2010 06:00:28 PM

## 2010-07-25 ENCOUNTER — Inpatient Hospital Stay (HOSPITAL_COMMUNITY)
Admission: EM | Admit: 2010-07-25 | Discharge: 2010-07-28 | DRG: 194 | Disposition: A | Discharge status: Skilled Nursing Facility | Payer: Medicare Other | Source: Ambulatory Visit | Attending: Internal Medicine | Admitting: Internal Medicine

## 2010-07-25 DIAGNOSIS — F411 Generalized anxiety disorder: Secondary | ICD-10-CM | POA: Diagnosis present

## 2010-07-25 DIAGNOSIS — J45909 Unspecified asthma, uncomplicated: Secondary | ICD-10-CM | POA: Diagnosis present

## 2010-07-25 DIAGNOSIS — I69959 Hemiplegia and hemiparesis following unspecified cerebrovascular disease affecting unspecified side: Secondary | ICD-10-CM

## 2010-07-25 DIAGNOSIS — I129 Hypertensive chronic kidney disease with stage 1 through stage 4 chronic kidney disease, or unspecified chronic kidney disease: Secondary | ICD-10-CM | POA: Diagnosis present

## 2010-07-25 DIAGNOSIS — N183 Chronic kidney disease, stage 3 unspecified: Secondary | ICD-10-CM | POA: Diagnosis present

## 2010-07-25 DIAGNOSIS — I440 Atrioventricular block, first degree: Secondary | ICD-10-CM | POA: Diagnosis present

## 2010-07-25 DIAGNOSIS — E785 Hyperlipidemia, unspecified: Secondary | ICD-10-CM | POA: Diagnosis present

## 2010-07-25 DIAGNOSIS — R1312 Dysphagia, oropharyngeal phase: Secondary | ICD-10-CM | POA: Diagnosis present

## 2010-07-25 DIAGNOSIS — Z794 Long term (current) use of insulin: Secondary | ICD-10-CM

## 2010-07-25 DIAGNOSIS — D638 Anemia in other chronic diseases classified elsewhere: Secondary | ICD-10-CM | POA: Diagnosis present

## 2010-07-25 DIAGNOSIS — J189 Pneumonia, unspecified organism: Principal | ICD-10-CM | POA: Diagnosis present

## 2010-07-25 DIAGNOSIS — I69920 Aphasia following unspecified cerebrovascular disease: Secondary | ICD-10-CM

## 2010-07-25 DIAGNOSIS — IMO0001 Reserved for inherently not codable concepts without codable children: Secondary | ICD-10-CM | POA: Diagnosis present

## 2010-07-25 DIAGNOSIS — Z7982 Long term (current) use of aspirin: Secondary | ICD-10-CM

## 2010-07-25 LAB — COMPREHENSIVE METABOLIC PANEL
AST: 15 U/L (ref 0–37)
CO2: 28 mEq/L (ref 19–32)
Calcium: 9.1 mg/dL (ref 8.4–10.5)
Creatinine, Ser: 1.64 mg/dL — ABNORMAL HIGH (ref 0.4–1.5)
GFR calc Af Amer: 50 mL/min — ABNORMAL LOW (ref >60)
GFR calc non Af Amer: 42 mL/min — ABNORMAL LOW (ref >60)
Sodium: 132 mEq/L — ABNORMAL LOW (ref 135–145)
Total Protein: 6.4 g/dL (ref 6.0–8.3)

## 2010-07-25 LAB — CBC
MCV: 87.6 fL (ref 78.0–100.0)
Platelets: 250 10*3/uL (ref 150–400)
RBC: 2.83 MIL/uL — ABNORMAL LOW (ref 4.22–5.81)
RDW: 15.3 % (ref 11.5–15.5)
WBC: 13 10*3/uL — ABNORMAL HIGH (ref 4.0–10.5)

## 2010-07-25 LAB — PROTIME-INR
INR: 1.02 (ref 0.00–1.49)
Prothrombin Time: 13.6 seconds (ref 11.6–15.2)

## 2010-07-25 LAB — DIFFERENTIAL
Basophils Absolute: 0.1 10*3/uL (ref 0.0–0.1)
Basophils Relative: 1 % (ref 0–1)
Eosinophils Absolute: 0.1 10*3/uL (ref 0.0–0.7)
Eosinophils Relative: 1 % (ref 0–5)
Neutrophils Relative %: 79 % — ABNORMAL HIGH (ref 43–77)

## 2010-07-25 LAB — OCCULT BLOOD, POC DEVICE: Fecal Occult Bld: NEGATIVE

## 2010-07-25 NOTE — Consult Note (Signed)
  [name], [name] NO.:  [id number]  MEDICAL RECORD NO.:  [id number]           PATIENT TYPE:  I  LOCATION:  2107                         FACILITY:  [facility]  PHYSICIAN:  Bertram Millard. Antaniya Venuti, M.D.DATE OF BIRTH:  1940-06-15  DATE OF CONSULTATION: DATE OF DISCHARGE:                                CONSULTATION   PROCEDURE/CONSULTATION NOTE  REASON FOR CONSULTATION:  Difficult Foley catheter placement.  BRIEF HISTORY:  A 70 year old male who was admitted today for treatment of sepsis.  The patient has possible UTI.  During the hospitalization, attempt was made at Foley catheter placement.  This was unsuccessful. Additionally, my nurse practitioner could not get the Foley in. Urologic consultation is requested.  The patient states that he has some urologic history, he cannot relate any past issues, however.  He thinks his urologic care was given here in [place].  PAST MEDICAL HISTORY:  Significant for hyperlipidemia, diabetes mellitus, history of CVA, history of depression, history of hemiplegia secondary to stroke, COPD, history of anxiety, history of GERD.  MEDICATIONS:  Recently include 1. Norvasc. 2. Mucinex. 3. Levaquin. 4. Protonix. 5. Prednisone taper. 6. Ambien. 7. Celexa. 8. Lantus insulin. 9. Multivitamin. 10.Oxycodone. 11.Phenergan. 12.Protonix. 13.Thiamine. 14.Tylenol.  ALLERGIES:  No known drug allergies.  SOCIAL HISTORY:  Unobtainable.  REVIEW OF SYSTEMS:  Unobtainable.  PHYSICAL EXAMINATION:  GENERAL:  Exam revealed an obtunded, but polite elderly male.  He was in no acute distress. ABDOMEN:  Flat.  I did not palpate his bladder. GENITOURINARY:  Phallus was uncircumcised.  Foreskin retracts easily. Scrotal skin was unremarkable.  The patient was lying in stool.  DESCRIPTION OF PROCEDURE:  After sterile prep and drape, I anesthetized the urethra with approximately 30 mL of 2% viscous lidocaine.  A 4- French filiform was  placed through the urethra, and I gradually dilated the urethra with fowlers to 24-French.  Urine was obtained.  The last couple of followers were placed.  I then placed a 16-French Coude catheter.  Light pink urine was obtained.  Ten milliliters of water was placed in the balloon, it was hooked to dependent drainage.  IMPRESSION: 1. Urethral stricture, status post dilation with catheter placement. 2. Sepsis, admitted to the ICU for management.  PLAN: 1. Urine was obtained for culture and sensitivity. 2. Antibiotics per routine. 3. I would leave the catheter in for at least 3-4 days, until the     patient is appropriately walking for voiding trial. 4. Please consult if further urologic assistance is needed.     Bertram Millard. [name], M.D.     SMD/MEDQ  D:  07/11/2010  T:  07/12/2010  Job:  [id number]  Electronically Signed by [name] M.D. on 07/25/2010 01:21:49 PM

## 2010-07-25 NOTE — Consult Note (Signed)
NAMEDUC, CROCKET NO.:  [id number]  MEDICAL RECORD NO.:  [id number]           PATIENT TYPE:  I  LOCATION:  2107                         FACILITY:  [facility]  PHYSICIAN:  Bertram Millard. Audrea Bolte, M.D.DATE OF BIRTH:  08-03-1940  DATE OF CONSULTATION:  07/11/2010 DATE OF DISCHARGE:                                CONSULTATION   REASON FOR CONSULTATION:  Difficult Foley catheter placement.  HISTORY OF PRESENT ILLNESS:  This is a 70 year old gentleman who was admitted to [facility] from skilled nursing facility today for treatment of severe sepsis secondary to possible urinary tract infections.  Order was given to place Foley catheter for obtaining urine specimen.  Nursing staff attempted placement with a 14-French indwelling Foley catheter meeting resistance approximately one third of the way in. Order was given to try a 16-French coude catheter at that time.  Again, attempt was made meeting same resistance.  No blood was observed after attempts.  The patient denies any pain, abdominal pain, chest pain, or shortness of breath.  He is cooperative and pleasant.  Although he does respond to questions, he is unable to actively respond to questions related to his past medical history and his review of systems.  PAST MEDICAL HISTORY: 1. Type 2 diabetes mellitus. 2. Hypertension. 3. History of CVA with right-sided hemiparesis. 4. Anxiety. 5. Depression. 6. Bronchial asthma. 7. COPD. 8. Hyperlipidemia. 9. Dysphagia.  PAST SURGICAL HISTORY:  Left carotid endarterectomy with Dacron patch angioplasty.  ALLERGIES:  He has no known drug allergies.  MEDICATIONS: 1. Tylenol. 2. Albuterol. 3. Rocephin. 4. Insulin. 5. Atrovent. 6. Methylprednisone. 7. Protonix. 8. Zosyn. 9. Vancomycin. 10.Vasopressin p.r.n.  FAMILY HISTORY:  Per history and physical, he stated that his mother had cancer, although the type was unknown.  Unsure of his father's  history.  SOCIAL HISTORY:  He resides at [facility].  He denies alcohol or tobacco.  REVIEW OF SYSTEMS:  Unable to completely obtain, but as documented in history of present illness.  PHYSICAL EXAMINATION:  VITAL SIGNS:  Temp 101.6, pulse 99, respiration 122, and blood pressure 110/54. CONSTITUTIONAL:  He is a well-developed, well-nourished white male in no acute distress.  Resting with eyes closed, but responses when asked to. HEENT:  Normocephalic and atraumatic.  Oropharynx is clear. ABDOMEN:  Soft and nontender with positive suprapubic distention. GU:  Penis uncircumcised without lesion or mass with bilateral descended testes without lesion or mass. EXTREMITIES:  There is right upper and lower extremity atrophy.  LABORATORY DATA:  Sodium is 136, potassium 4.2, chloride 102, CO2 of 20, BUN 44, creatinine 3.05, and glucose 96.  WBC is 9.8, hemoglobin 8.8, hematocrit 27.0, and platelets 54.  RADIOLOGY:  Renal ultrasound shows: 1. Right lower pole simple cyst, approximately 12 mm. 2. Left nephrolithiasis or renal vascular calcification.  IMPRESSION/PLAN:  Difficult Foley catheter placement secondary to urethral stricture.  Attempted placement with an 18-French coude and a 14-French regular indwelling Foley catheter, both time meeting resistance approximately half way in.  No blood was observed upon attempt or after attempts ruling out false passage way.  Dr. [name] was notified of  results and will be in to place Foley catheter with flexible cystoscopy and dilation of urethral stricture.  Dr. [name] will dictate this procedure.     Delia Chimes, NP   ______________________________ Bertram Millard. Sandor Arboleda, M.D.    MA/MEDQ  D:  07/11/2010  T:  07/12/2010  Job:  [id number]  Electronically Signed by Delia Chimes NP on 07/12/2010 10:12:28 AM Electronically Signed by [name] M.D. on 07/25/2010 01:21:16 PM

## 2010-07-26 ENCOUNTER — Observation Stay (HOSPITAL_COMMUNITY): Payer: Medicare Other

## 2010-07-26 LAB — COMPREHENSIVE METABOLIC PANEL
ALT: 12 U/L (ref 0–53)
AST: 15 U/L (ref 0–37)
Albumin: 2.4 g/dL — ABNORMAL LOW (ref 3.5–5.2)
Alkaline Phosphatase: 63 U/L (ref 39–117)
Chloride: 102 mEq/L (ref 96–112)
Creatinine, Ser: 1.51 mg/dL — ABNORMAL HIGH (ref 0.4–1.5)
GFR calc Af Amer: 55 mL/min — ABNORMAL LOW (ref >60)
Potassium: 4 mEq/L (ref 3.5–5.1)
Sodium: 134 mEq/L — ABNORMAL LOW (ref 135–145)
Total Bilirubin: 0.2 mg/dL — ABNORMAL LOW (ref 0.3–1.2)

## 2010-07-26 LAB — GLUCOSE, CAPILLARY: Glucose-Capillary: 120 mg/dL — ABNORMAL HIGH (ref 70–99)

## 2010-07-26 LAB — URINALYSIS, MICROSCOPIC ONLY
Glucose, UA: NEGATIVE mg/dL
Ketones, ur: NEGATIVE mg/dL
pH: 5.5 (ref 5.0–8.0)

## 2010-07-26 LAB — CBC
MCH: 27.2 pg (ref 26.0–34.0)
MCV: 87.5 fL (ref 78.0–100.0)
Platelets: 256 10*3/uL (ref 150–400)
RDW: 15.3 % (ref 11.5–15.5)

## 2010-07-26 LAB — IRON AND TIBC: TIBC: 228 ug/dL (ref 215–435)

## 2010-07-26 LAB — FOLATE: Folate: 18 ng/mL

## 2010-07-26 LAB — CARDIAC PANEL(CRET KIN+CKTOT+MB+TROPI)
CK, MB: 1.3 ng/mL (ref 0.3–4.0)
CK, MB: 1.4 ng/mL (ref 0.3–4.0)
Relative Index: INVALID (ref 0.0–2.5)
Relative Index: INVALID (ref 0.0–2.5)
Troponin I: 0.01 ng/mL (ref 0.00–0.06)
Troponin I: 0.03 ng/mL (ref 0.00–0.06)

## 2010-07-26 LAB — FERRITIN: Ferritin: 252 ng/mL (ref 22–322)

## 2010-07-26 LAB — LACTIC ACID, PLASMA: Lactic Acid, Venous: 2.1 mmol/L (ref 0.5–2.2)

## 2010-07-26 LAB — LIPASE, BLOOD: Lipase: 25 U/L (ref 11–59)

## 2010-07-27 ENCOUNTER — Observation Stay (HOSPITAL_COMMUNITY): Payer: Medicare Other

## 2010-07-27 LAB — BASIC METABOLIC PANEL
BUN: 16 mg/dL (ref 6–23)
CO2: 30 mEq/L (ref 19–32)
Chloride: 101 mEq/L (ref 96–112)
Creatinine, Ser: 1.84 mg/dL — ABNORMAL HIGH (ref 0.4–1.5)
Glucose, Bld: 107 mg/dL — ABNORMAL HIGH (ref 70–99)

## 2010-07-27 LAB — GLUCOSE, CAPILLARY
Glucose-Capillary: 95 mg/dL (ref 70–99)
Glucose-Capillary: 170 mg/dL — ABNORMAL HIGH (ref 70–99)
Glucose-Capillary: 128 mg/dL — ABNORMAL HIGH (ref 70–99)
Glucose-Capillary: 207 mg/dL — ABNORMAL HIGH (ref 70–99)

## 2010-07-27 LAB — URINE CULTURE

## 2010-07-27 LAB — CBC
Hemoglobin: 7.7 g/dL — ABNORMAL LOW (ref 13.0–17.0)
MCH: 27.5 pg (ref 26.0–34.0)
MCHC: 31.7 g/dL (ref 30.0–36.0)
MCV: 86.8 fL (ref 78.0–100.0)
RBC: 2.8 MIL/uL — ABNORMAL LOW (ref 4.22–5.81)

## 2010-07-28 LAB — BASIC METABOLIC PANEL
CO2: 31 mEq/L (ref 19–32)
Calcium: 9.1 mg/dL (ref 8.4–10.5)
Creatinine, Ser: 1.9 mg/dL — ABNORMAL HIGH (ref 0.4–1.5)
GFR calc Af Amer: 42 mL/min — ABNORMAL LOW (ref >60)

## 2010-07-28 LAB — CBC
Hemoglobin: 7.7 g/dL — ABNORMAL LOW (ref 13.0–17.0)
MCH: 27.1 pg (ref 26.0–34.0)
MCHC: 31 g/dL (ref 30.0–36.0)

## 2010-07-29 LAB — TYPE AND SCREEN
Unit division: 0
Unit division: 0

## 2010-07-31 LAB — GLUCOSE, CAPILLARY: Glucose-Capillary: 104 mg/dL — ABNORMAL HIGH (ref 70–99)

## 2010-08-01 LAB — CULTURE, BLOOD (ROUTINE X 2): Culture: NO GROWTH

## 2010-08-17 NOTE — Discharge Summary (Signed)
NAME:  Justin Sherman, Justin Sherman              ACCOUNT NO.:  [id number]  MEDICAL RECORD NO.:  [id number]           PATIENT TYPE:  I  LOCATION:  3729                         FACILITY:  [facility]  PHYSICIAN:  Marcellus Scott, MD     DATE OF BIRTH:  [birth year]  DATE OF ADMISSION:  07/25/2010 DATE OF DISCHARGE:  07/28/2010                        DISCHARGE SUMMARY - REFERRING   PRIMARY CARE PHYSICIAN:  Lenon Curt. Chilton Si, MD at [facility].  DISCHARGE DIAGNOSES: 1. Healthcare-associated pneumonia. 2. Anemia. 3. Stage III chronic kidney disease. 4. Uncontrolled type 2 diabetes mellitus. 5. 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 6. History of hypertension.  DISCHARGE MEDICATIONS: 1. Chlorhexidine 6 cloth bath, one application topically daily     through July 30, 2010 to treat for positive MRSA screen. 2. Lantus reduced to 5 units subcutaneously at bedtime. 3. Bactroban one application nasally b.i.d. through the July 30, 2010, then discontinue.  Again being used to treat for the MRSA     positive screen. 4. Enteric-coated aspirin 81 mg p.o. daily. 5. Celexa 20 mg p.o. at bedtime. 6. Guaifenesin XR 600 mg p.o. b.i.d. 7. Levaquin 500 mg p.o. daily through the August 02, 2010, then     discontinue. 8. Lorazepam 0.5 mg p.o. at bedtime p.r.n. for anxiety. 9. Multivitamins 1 tablet p.o. daily. 10.Neosporin one application to affected area of the skin topically     b.i.d. p.r.n. 11.NovoLog sliding scale insulin subcutaneously t.i.d. with meals as     per previous nursing home directions. 12.Oxycodone 10 mg p.o. every four hourly p.r.n. for pain. 13.Pantoprazole 40 mg p.o. daily. 14.Phenergan 25 mg p.o. every six hourly p.r.n. for nausea. 15.Proteinex 30 mL p.o. daily. 16.Thiamine 100 mg p.o. daily. 17.Tylenol 650 mg p.o. every six hourly p.r.n. for pain. 18.Zocor 40 mg p.o. at bedtime. 19.Zolpidem 5 mg p.o. at bedtime p.r.n. for  insomnia. 20.Ferrous sulfate 325 mg p.o. b.i.d. 21.Albuterol 2.5 mg nebulizations every four hourly inhaled p.r.n. for     dyspnea or wheezing.  DISCONTINUED MEDICATIONS: 1. Scheduled oxycodone tablets. 2. Ipratropium/albuterol nebulizations.  IMAGING: 1. Modified barium swallow on July 27, 2010. 2. Chest x-ray July 26, 2010 pneumonia involving the right lung base,     likely the right middle lobe.  PERTINENT LABS:  Basic metabolic panel today significant for BUN 10, creatinine 1.90.  CBC with hemoglobin 7.7, hematocrit 25, white blood cell 11, platelets 207, MCV 87, blood cultures x2 on July 26, 2010 with no growth to date.  Urine culture was suggestive of contamination. Cardiac enzymes were negative.  Anemia panel showed iron 14, total iron- binding capacity 228, percentage saturation 6, vitamin B12 1112, serum folate 18, ferritin of 252, TSH 3.008, lactate of 2.1.  Urinalysis showed 21-50 white blood cells and rare bacteria.  Hepatic panel on admission was only remarkable for albumin of 2.4, INR was 1.10. Procalcitonin was 0.16.  MRSA PCR screening was positive.  Lipase 25. Fecal occult blood testing was negative.  CONSULTATIONS:  None.  DIET:  Carbohydrate-modified medium diet with thin liquids.  ACTIVITY:  Out of bed with  assist and as per physical therapy and occupational therapy evaluation.  CHIEF COMPLAINTS:  None.  According to nursing, there have been no acute issues.  PHYSICAL EXAMINATION:  GENERAL:  The patient is in no obvious distress. Telemetry reveals sinus rhythm in the 70s with first degree AV block and occasional missed beats. VITAL SIGNS:  Temperature 98.4 degrees Fahrenheit, pulse 71 per minute, respiration 18 per minute, blood pressure 119/74 mmHg and saturating at 94% on room air. RESPIRATORY SYSTEM:  Clear except slightly decreased breath sounds in the bases.  No increased work of breathing. CARDIOVASCULAR SYSTEM:  First and second heart sounds  heard regular. ABDOMEN:  Nondistended, nontender, soft and bowel sounds present. CENTRAL NERVOUS SYSTEM:  Patient is awake, alert, oriented x2 with expressive aphasia and residual right hemiparesis with upper extremity weaker than the lower extremity, grade 1-2 in the right upper extremity and 3/5 in the right lower extremity.  HOSPITAL COURSE:  Mr. Tallman is a pleasant 70 year old male patient who was discharged from the hospital on July 16, 2010 after being treated for sepsis secondary to urinary tract infection and Proteus mirabilis bacteremia.  He at that time had acute renal failure and ARDS.  The patient was sent to the emergency room because of weakness and anemia. He does have expressive aphasia from previous CVA.  He denied any dyspnea.  Evaluation in the emergency room revealed chest x-ray suggesting right middle lobe pneumonia.  The patient was admitted for further management. 1. Healthcare-acquired right middle lobe pneumonia.  Aspiration     pneumonia was also suspected.  Swallow eval was done by the speech     therapist and they only saw mild oropharyngeal dysphagia, but no     penetration or aspiration and they recommended the diet as above.     He was placed on IV Zosyn, vancomycin and ciprofloxacin.  He has     remained afebrile without any cough, dyspnea or leukocytosis.  He     will be switched back to Levaquin to complete a total 10-day course     of antibiotics.  Recommend repeating chest x-ray in a couple of     weeks to ensure resolution. 2. Anemia, which is possibly of chronic disease versus the recent     critical illness versus possible iron deficiency.  Hemoglobins have     been stable.  His stool occult blood has been negative.  We will     start oral iron supplements.  Recommend outpatient Gastroenterology     and possible Hematology evaluation as deemed necessary. 3. Chronic kidney disease stage III.  Periodic follow-up of his basic     metabolic panel as  an outpatient. 4. Uncontrolled type 2 diabetes mellitus.  He has not required as much     insulin that as he was at the nursing facility.  His CBCs range     mostly in the 100s on the about doses of insulin.  Titrate the     insulins at the nursing facility as deemed necessary. 5. History of CVA with expressive aphasia and right hemiparesis.  For     physical therapy and occupational therapy, evaluation at the     nursing facility. 6. Weakness, possibly secondary to his pneumonia.  This seems to have     resolved.  The patient was also presumed to have a urinary tract infection. However, the urine culture did not confirm that.  DISPOSITION:  The patient is discharged the skilled nursing facility in stable condition.  FOLLOW UP RECOMMENDATIONS: 1. CBC and basic metabolic panel in 3-5 days from discharge. 2. Chest x-ray in a couple of weeks to ensure resolution of his     pneumonia.  Time taken in coordinating this discharge was 45 minutes.     Marcellus Scott, MD     AH/MEDQ  D:  07/28/2010  T:  07/28/2010  Job:  [id number]  cc:   Lenon Curt. Chilton Si, M.D.  Electronically Signed by Marcellus Scott MD on 08/17/2010 11:48:00 PM

## 2010-08-20 NOTE — H&P (Signed)
NAME:  Justin Sherman, Justin Sherman NO.:  [id number]  MEDICAL RECORD NO.:  [id number]           PATIENT TYPE:  O  LOCATION:  3729                         FACILITY:  [facility]  PHYSICIAN:  Eduard Clos, MDDATE OF BIRTH:  January 21, 1941  DATE OF ADMISSION:  07/25/2010 DATE OF DISCHARGE:                             HISTORY & PHYSICAL   PRIMARY CARE PHYSICIAN:  Dr. Chilton Si at [facility].  CHIEF COMPLAINT:  Weakness and anemia.  HISTORY OF PRESENT ILLNESS:  A 70 year old male who was just recently discharged from hospital on April 8, after being treated for sepsis secondary to UTI and has had a Proteus mirabilis bacteremia.  During that admission.  The patient also had acute renal failure and ARDS.  The patient also has history of diabetes mellitus type 2, previous CVA with right hemiparesis and expressive aphasia was brought into ER because of weakness and progressive decrease in hemoglobin.  In the nursing home, the patient's hemoglobin was found to be on 7.4 which was reconfirmed in the ER, it was 7.7.  Guaiac is negative.  The patient does have some leukocytosis, initially was found to be mildly in the low 100s blood pressure.  The patient at this time has been admitted for further workup.  The patient is having expressive aphasia.  He does not complain but when asked if he was having any chest pain, he said no shortness of breath. He denied any nausea or vomiting.  He said that he had some discomfort in epigastric area.  Denies any diarrhea, any loss of consciousness, or dizziness.  PAST MEDICAL HISTORY: 1. History of CVA with right-sided hemiparesis. 2. History of diabetes mellitus type 2. 3. History of hypertension. 4. History of anemia. 5. History of recent sepsis from UTI and Proteus mirabilis bacteremia     which developed into ARDS. 6. History of arrhythmia, Mobitz type 2.  MEDICATIONS:  The patient was recently discharged that includes: 1. NovoLog  sliding scale. 2. Lantus insulin 5 units subcutaneous at bedtime. 3. Aspirin 81 mg p.o. daily. 4. Celexa 20 mg p.o. daily. 5. Guaifenesin. 6. Ipratropium. 7. Lorazepam 0.5 mg needed. 8. Multivitamin. 9. Neosporin ointment. 10.Oxycodone 20 mg p.o. in the morning and 10 mg q.4 p.r.n. for pain. 11.Protonix 40 daily. 12.Phenergan 25. 13.B1 p.o. daily. 14.p.r.n. 15.Simvastatin 40 daily. 16.Zolpidem.  ALLERGIES:  No known drug allergies.  FAMILY HISTORY:  Nothing contributory.  SOCIAL HISTORY:  The patient does not smoke cigarette, drink alcohol, or use illegal drugs.  He lives in a nursing home.  He is a full code.  REVIEW OF SYSTEMS:  As per history of present illness, nothing else significant.  PHYSICAL EXAMINATION:  GENERAL:  The patient examined at bedside, not in acute distress. VITAL SIGNS:  Blood pressure is 116/67, pulse is 77 per minute, temperature 98.2, respirations 18 per minute, and O2 saturation is 95% on room air. HEENT:  Anicteric.  No pallor.  No discharge from ears, eyes, nose, mouth, or throat.CHEST:  Bilateral air entry present.  No rhonchi.  No crepitation. HEART:  S1 and S2 heard. ABDOMEN:  Soft and nontender.  Bowel sounds  heard. CNS:  The patient is alert and awake.  He is able to follow commands. He moves left upper and lower extremities without difficulty.  The patient has right-sided hemiparesis.  The patient also has expressive aphasia. EXTREMITIES:  Peripheral pulses are felt.  I do not see any acute ischemic changes, cyanosis, or clubbing.  LABORATORY DATA:  EKG has been ordered.  Chest x-ray has been ordered. CBC; WBC is 13, hemoglobin is 7.7, hematocrit 24.8, platelets 250. PT/INR is 13.6 and 1.  Complete metabolic panel; sodium 132, potassium 3.8, chloride 97, carbon dioxide 28, glucose 133, BUN 25, creatinine 1.6 that is where his creatinine is.  Total bilirubin is 0.3, alk phos 66, AST 15, ALT 14, calcium 9.1.  I am going to add a lipase  level.  Fecal occult blood is negative.  ASSESSMENT: 1. Weakness with progressive anemia with fecal occult blood being     negative. 2. Leukocytosis with recent history of Proteus mirabilis bacteriemia     and sepsis secondary to urinary tract infection. 3. Chronic kidney disease with a history of obstructive uropathy and     also urethral strictures. 4. History of diabetes mellitus type 2. 5. Hyperlipidemia. 6. History of cerebrovascular accident with right-sided hemiparesis     and expressive aphasia. 7. History of Mobitz type 2 arrhythmias.  PLAN: 1. At this time, we will admit the patient to telemetry. 2. At this time, the patient does have leukocytosis, his temperature     is only 99.  We will get a stat blood culture x2, urine cultures.     We will get a UA.  I am going to get a procalcitonin level and     lactic acid level.  Chest x-ray is pending.  We will cycle cardiac     markers. 3. For his anemia, we will get anemia panel.  Type and cross match 2     units of PRBCs and hold, transfuse if it     gets further low below 7 or he gets symptomatic. 4. We will need to verify his home medication. 5. Further recommendation based on tests ordered and clinical course.     Eduard Clos, MD     ANK/MEDQ  D:  07/26/2010  T:  07/26/2010  Job:  [id number]  cc:   Dr. Chilton Si  Electronically Signed by Midge Minium MD on 08/20/2010 08:16:16 AM

## 2010-08-25 NOTE — Discharge Summary (Signed)
[place]  Patient:    Justin Sherman, Justin Sherman                     MRN: [id number] Adm. Date:  [id number] Disc. Date: [id number] Attending:  Phifer, Harriett Sine [place] Dictator:   Felton Clinton, M.D. CC:         Gerrit Friends. Dietrich Pates, M.D. [facility]   Discharge Summary  DISCHARGE DIAGNOSES: 1. Presyncopal episode with asymptomatic three-second pause on telemetry. 2. Tobacco abuse.  DISCHARGE MEDICATIONS:  None.  FOLLOW-UP:  Justin Sherman has a follow-up appointment with Dr. Ladona Ridgel at [facility] on July 2, at 3 p.m.  Prior to this appointment, he has a carotid ultrasound scheduled for Monday, June 17, at 9 a.m. also at the [facility] office.  CONSULTING PHYSICIANS:   Gerrit Friends. Dietrich Pates, M.D. [facility], cardiology.  PROCEDURE:  Two-dimensional echocardiogram, September 16, 2000.  Normal left ventricular function with estimated ejection fraction of 55 to 65%.  No wall motion abnormalities.  No valvular abnormalities.  HISTORY OF PRESENT ILLNESS:  Justin Sherman is a 70 year old white male with no significant past medical history except tobacco abuse, who presented to the emergency department with a three to four-day history of weakness, dizziness with exertion, and nausea.  He presented to the emergency department after a severe episode of lightheadedness, nausea, and weakness of his arms and legs after walking around the grocery store.  He noted a "feeling of despair" and felt like he was going to "fall out."  He denied chest pain and did describe shortness of breath which is stable for him.  He states that he has been under increased stress lately and has also been working outdoors a great deal lately as well.  He has a 30-pack-year history of tobacco abuse, but denies alcohol use.  He has a chronic cough productive of whitish sputum.  He denies hemoptysis.  He also denies vertigo and extremity numbness or tingling.  PHYSICAL EXAMINATION:  VITAL SIGNS: Temperature  97.5, blood pressure 146/85, pulse 97, respirations 20, oxygen saturations 97% on room air.  GENERAL: A well-developed, well-nourished white male in no acute distress.  HEENT: PERRL. EOMI.  Sclerae anicteric.  NECK: No LAD, no JVD, no bruits.  CARDIOVASCULAR: Regular rate and rhythm with distant heart sounds.  LUNGS: Decreased air movement bilaterally with inspiratory and expiratory wheezes.  ABDOMEN: Firm, round, nontender with positive bowel sounds.  No hepatosplenomegaly. EXTREMITIES: No clubbing, cyanosis, or edema.  2+ dorsalis pedis pulses. NEUROLOGICAL: Cranial nerves II-XII intact.  Nonfocal examination.  SKIN: A small 1 mm papules over the lower extremities, some with scabs which appear to be excoriated.  LABORATORY DATA:  Sodium 139, potassium 4.2, chloride 106, bicarb 27, BUN 10, creatinine 1.2, glucose 91, white blood cells 10.4, absolute neutrophil count 8.9.  Hemoglobin 14.5, MCV 89, platelets were clumped, but appeared to be adequate.  Cardiac enzymes; CK 159, MB 2.3, index 1.4, troponin 0.01. Urinalysis was negative.  Chest x-ray showed no active disease.  EKG showed normal sinus rhythm with a rate of 92 and a left anterior fasicular block. No prior EKGs were available for comparison.  HOSPITAL COURSE:  Justin Sherman was admitted to telemetry and ruled out for myocardial infarction with serial cardiac enzymes and follow-up EKG.  He remained asymptomatic throughout his hospitalization until the morning of discharge, when he was noted to have a three-second pause on telemetry. He was asymptomatic with stable vital signs during this episode.  A cardiology consult  with Dr. Dietrich Pates was obtained.  He felt that Justin Sherman may have some conduction abnormalities which could be worked up as an outpatient.  He has a follow-up appointment scheduled with Dr. Ladona Ridgel as noted above.  A two-dimensional echocardiogram was obtained which was essentially normal. Given his family history of  coronary artery disease as well as his extensive tobacco history, fasting lipids were checked and he was found to have hypercholesterolemia.  He will need to be started on a statin as an outpatient.  TSH was checked to evaluate his fatigue and was normal. DD:  09/17/00 TD:  09/17/00 Job: [id number]

## 2010-08-25 NOTE — Discharge Summary (Signed)
NAMEJAVARUS, Justin Sherman NO.:  [id number]   MEDICAL RECORD NO.:  [id number]          PATIENT TYPE:  INP   LOCATION:  4731                         FACILITY:  [facility]   PHYSICIAN:  Hettie Holstein, D.O.    DATE OF BIRTH:  1941/01/15   DATE OF ADMISSION:  06/03/2005  DATE OF DISCHARGE:  06/10/2005                                 DISCHARGE SUMMARY   PRIMARY CARE PHYSICIAN:  The patient does not have a primary care physician.  He is from [place].  At present, I am referring him to Dr. Nicholos Johns  with his first hospital followup appointment this Wednesday, June 13, 2005,  at 10:30 a.m. to follow his ongoing Coumadin  therapy.   PRINCIPAL DIAGNOSIS:  Acute stroke, left sided, with MRI suggestion of  possible multiple embolic causes and carotid Doppler studies revealing left-  sided carotid stenosis with known previous carotid disease and cardiac  enzymes by Dr. Madilyn Fireman.  He was seen by neurology as well as Dr. Madilyn Fireman, and he  was on heparin bridge to a therapeutic INR on Warfarin therapy.  He is  currently to follow up with Dr. Madilyn Fireman the third or fourth week of March for  further management.  His residual is only minimal right-hand clumsiness.  He  is able to write, and his strength is good. No other focal deficits are  prominent.   ADDITIONAL DIAGNOSES:  1.  Elevated homocysteine of 23.7, started on Foltx this admission.  2.  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  3.  Tobacco abuse.  The patient is instructed on importance of tobacco      cessation.  4.  Hyperlipidemia.  The patient was initiated on Zocor this admission with      recommendations to follow with his primary care physician.  5.  Status post bilateral inguinal hernia repair.   MEDICATIONS ON DISCHARGE:  1.  Coumadin 6 mg daily with a followup PT/INR to be performed on Wednesday,      June 13, 2005, with adjustment on that point.  His doses during      hospitalization were coordination by our  pharmacist, and discharge      recommendation dose was 6 mg daily.  2.  Altace 2.5 mg daily.  This was initiated, and basic metabolic panel      should be followed periodically by patient's primary care physician.  3.  Zocor 40 mg daily as described above.  4.  Nicotine patient daily prescribed.  He was instructed not to smoke.  5.  Foltx 1 mg daily.  6.  Aspirin 81 mg daily.   He was instructed to maintain a healthy heart diet.   DISPOSITION:  The patient was to follow up with Dr. Nicholos Johns on June 13, 2005, at 10:30 a.m. and call Dr. Madilyn Fireman for followup appointment with the  next couple of weeks, before the end of March.   CONSULTATIONS:  1.  Marlan Palau, M.D., neurology.  2.  Balinda Quails, M.D. neurosurgery.   STUDIES:  1.  Carotid  Dopplers revealed 80% internal carotid artery  stenosis on the      right, and on the left greater than 80% internal carotid artery stenosis      with plaque versus thrombus.  2.  A 2-D echocardiogram that revealed left ventricular ejection fraction of      60 to 65%.  Left ventricular wall thickness was at upper limits of      normal.   LABORATORY DATA:  As described above.  His LDL during this hospitalization  revealed LDL of 128, triglycerides 37, cholesterol 208.  Hemoglobin A1c was  6.6.  INR on day of discharge was 2.1.   HISTORY OF PRESENT ILLNESS:  For full details, please refer to H&P as  dictated by Dr. Michaelyn Barter.  However, briefly, Mr. Sibal is a 70-year-  old male with history of cerebrovascular disease with left carotid  endarterectomy per Dr. Madilyn Fireman, and ongoing tobacco abuse, who presented on 25  February with episode of right-arm weakness and slight speech disturbance  that occurred at home.  The right-arm weakness markedly improved, though not  completely resolved.  He was admitted with suspected TIA event.  He had some  clinical worsening on the morning at around 5 a.m.  Neurology was consulted  at that time.    HOSPITAL COURSE:  As noted above, Mr. Wiederholt was admitted, had event as  described above with neurologic worsening.  The stroke team was called, and  then he underwent MRI.  It was discovered that he had left posterior  cerebral artery distribution stroke and left carotid stenosis.  He was  coumadinized, started on heparin, seen by Dr. Madilyn Fireman.  Underwent further  evaluation.  He had marked improvement.  He underwent speech therapy  evaluation without evidence of dysphagia.  Physical therapy revealed good  function, and he is being discharged home on  Coumadin therapy in stable condition with outpatient therapy as needed.  He  will follow up with Dr. Madilyn Fireman to address his carotid disease.      Hettie Holstein, D.O.  Electronically Signed     ESS/MEDQ  D:  06/10/2005  T:  06/10/2005  Job:  [id number]   cc:   Balinda Quails, M.D.  [address]  [place]  [place] [address]   Georgianne Fick, M.D.  Fax: [phone]   C. Lesia Sago, M.D.  Fax: [phone]

## 2010-08-25 NOTE — H&P (Signed)
NAME:  Justin Sherman, Justin Sherman NO.:  [id number]   MEDICAL RECORD NO.:  [id number]          PATIENT TYPE:  EMS   LOCATION:  MAJO                         FACILITY:  [facility]   PHYSICIAN:  Michaelyn Barter, M.D. DATE OF BIRTH:  1940/11/30   DATE OF ADMISSION:  06/03/2005  DATE OF DISCHARGE:                                HISTORY & PHYSICAL   PRIMARY CARE PHYSICIAN:  Unassigned.   CHIEF COMPLAINT:  Right extremity weakness.   Justin Sherman is a 70 year old gentleman, who has undergone carotid  endarterectomy in the past, who states that at approximately 3:30 p.m. he  felt like he had no control over his right arm. He is very vague with  regards to exactly what he means with regards to loss of control of the arm.  He states that the arm would move; however, it appeared to be somewhat off  from baseline. He denies having any pain in the arm. He states that he had  some difficulty  performing right hand grip. He states that the sensation  lasted for several hours. He denies having any difficulties with regards to  his left upper extremity. He states that he has never had similar feelings  before. He has no additional complaints. No nausea, vomiting, fever or  chills. He was sitting down watching television when this occurred. He  states that his symptoms have since resolved and he is currently back to his  baseline.   PAST SURGICAL HISTORY:  1.  Left-sided carotid endarterectomy was done secondary to severe left      internal carotid artery stenosis. It was performed by Dr. Liliane Bade      January 08, 2001.  2.  Bilateral inguinal hernia repairs in 1983.   PAST MEDICAL HISTORY:  Presyncopal episode with asymptomatic three second  pause on telemetry back in June 2002.   ALLERGIES:  No known allergies.   MEDICATIONS:  Aspirin 325 milligrams p.o. daily.   SOCIAL HISTORY:  Cigarettes: Positive. The patient smokes just under one  pack per day. He started smoking as a teenager.  Alcohol: The patient drinks  alcohol occasionally.   FAMILY HISTORY:  Mother has cancer. The patient does not know what type of  cancer. Father died from natural causes.   REVIEW OF SYSTEMS:  As per HPI. Otherwise, all other systems are negative.   PHYSICAL EXAMINATION:  GENERAL: The patient is awake. He shows no obvious  distress. Initially, he was asleep but he aroused easily. He looks  comfortable.  VITAL SIGNS: Temperature is 97.1, blood pressure 190/98, heart rate 92,  respirations 24, O2 saturation 97% on room air.  HEENT: Normocephalic and atraumatic. Anicteric. Conjunctivae are injected.  Pupils are equal, round, and reactive to light. Oral mucosa is pink, moist  and no exudates.  NECK: Supple. No lymphadenopathy. Thyroid is not palpable. There is a old,  well-healed left-sided surgical scar secondary to prior endarterectomy.  Strong carotid upstrokes bilaterally. No bruits.  CARDIAC: Distant heart sounds.  ABDOMEN:  Soft, nontender, and nondistended. Bowel sounds are present x4  quadrants. No masses.  EXTREMITIES: No leg edema.  NEUROLOGICAL: The patient is alert and oriented x3. Cranial nerves II  through XII intact. No facial droop. No focal asymmetry. Good bilateral hand  grip. Normal bilateral arm and range of motion.   LABORATORY DATA:  White blood cell count 10.3, hemoglobin 15.0, hematocrit  44.5 and platelets 194,000. PT 12.3, INR 0.9, PTT 33. Sodium 137, potassium  4.3, chloride 108, BUN 12, creatinine 1.0, glucose 129.   ASSESSMENT:  Justin Sherman is a 70 year old gentleman here for evaluation of  acute onset of right upper extremity weakness.   PLAN:  1.  Right arm weakness: The etiology of this is most likely to be transient      ischemic attack. CT scan of the head has been completed and interpreted      as being negative for any acute bleed or events. I explained to the      patient that I would like to do a MRI of his head; however, the patient       refused. He stated that he did not want to have the test performed. We      will admit the patient as a 23-hour admit. We will check his cardiac      enzymes and continue aspirin therapy.  2.  Elevated blood pressure: The patient may have undiagnosed hypertension.      We will consider adding an antihypertensive medication.  3.  Gastrointestinal prophylaxis: We will provide Protonix.  4.  DVT prophylaxis: We will provide Lovenox.      Michaelyn Barter, M.D.  Electronically Signed     OR/MEDQ  D:  06/04/2005  T:  06/04/2005  Job:  [id number]

## 2010-08-25 NOTE — Discharge Summary (Signed)
[place]  Patient:    Justin Sherman, Justin Sherman                     MRN: [id number] Adm. Date:  [id number] Disc. Date: [id number] Attending:  Phifer, Harriett Sine [place] Dictator:   Felton Clinton, M.D.                           Discharge Summary  ADDENDUM  DISCHARGE LABORATORY DATA:  Cardiac enzymes, third set; CK 101, MB 1.9, index 1.9, troponin 0.01.  PT 12.4, INR 0.9, TSH 2.109.  Lipids; cholesterol 204, triglycerides 262, HDL 35, LDL 117.  White count 10.4, hemoglobin 14.5, platelets 205.  Sodium 141, potassium 3.8, chloride 107, bicarb 29, BUN 12, creatinine 1.1, glucose 106, calcium 9.7, total bilirubin 0.8, alkaline phosphatase 63, SGOT 27, SGPT 31, total protein 6.3, albumin 3.5, calcium 10.1. DD:  09/17/00 TD:  09/17/00 Job: [id number]

## 2010-08-25 NOTE — Consult Note (Signed)
NAME:  Justin Sherman, Justin Sherman NO.:  [id number]   MEDICAL RECORD NO.:  [id number]          PATIENT TYPE:  OBV   LOCATION:  4731                         FACILITY:  [facility]   PHYSICIAN:  Marlan Palau, M.D.  DATE OF BIRTH:  15-Jul-1940   DATE OF CONSULTATION:  06/04/2005  DATE OF DISCHARGE:                                   CONSULTATION   HISTORY OF PRESENT ILLNESS:  Justin Sherman is a 70 year old right handed  white male born Dec 20, 1940 with a history of cerebrovascular disease with  a left carotid endarterectomy in the past and an ongoing tobacco abuse. This  patient was admitted to [facility] on the 25th of February 2007  with an episode of right arm weakness, some slight speech disturbance that  occurred while at home. The patient claims that the right arm weakness  markedly improved but never completely resolved during this admission. The  patient was admitted to the hospital with clinical TIA-type event. The  patient, however, had clinical worsening this morning around 5 a.m. with the  nurse in the room. The patient was immediately sent down for a MRI scan,  code stroke was called. MRI scan shows evidence of a left posterior cerebral  artery distribution cerebrovascular infarction with intracranial MRI  angiogram that reveals some stenosis of the left M-1 segment of the middle  cerebral artery and the posterior cerebral artery appeared to unremarkable  by my reading. No significant pre-existing small vessel disease or other  strokes noted. Neurology was asked to see this patient for evaluation as a  code stroke.   The patient had been on aspirin but was not taking this medication on a  regular basis. The patient really has no primary doctor at this point. Had  been seen by Dr. Quintella Reichert in the past.   PAST MEDICAL HISTORY:  1.  History of cerebrovascular disease, left carotid endarterectomy.  2.  History of bilateral inguinal hernia repairs.  3.  Tobacco  abuse.   MEDICATIONS:  Medications include aspirin, taken occasionally, otherwise no  medications.   HABITS:  The patient smokes a pack of cigarettes a day, drinks alcohol on  occasion.   ALLERGIES:  NO KNOWN ALLERGIES.   SOCIAL HISTORY:  The patient lives in the __________  area, is divorced, has  one daughter who is alive and well. The patient otherwise has an  unremarkable social history.   FAMILY HISTORY:  Notable that mother is in her 64s, apparently has cancer.  Father died of natural causes, had diabetes. The patient has three  brothers, no sisters. One brother has diabetes, possibly a second brother.  His oldest brother had surgery for renal cell carcinoma.   REVIEW OF SYSTEMS:  Notable for no recent fevers or chills. The patient  denies headache, visual field disturbance. Speech has been altered some, was  somewhat confused at times with speech. The patient denies neck pain,  shortness of breath, chest pains, abdominal pain, nausea, vomiting. Denies  problems controlling the bowels or bladder. Denies any numbness on the right  side per se. Has had  some weakness predominantly of the right arm. Gait has  not been affected. The patient denies any dizziness or blackout episodes.   PHYSICAL EXAMINATION:  VITAL SIGNS: Blood pressure is 118/76, heart rate 79,  respiratory rate 20, temperature afebrile.  GENERAL: This patient is a well developed white male who is alert,  cooperative at time of examination.  HEENT: Head is atraumatic. Eyes, pupils are round, react to light. Discs are  flat bilaterally. Cataracts are present bilaterally.  NECK:  Neck supple. No carotid bruits noted.  RESPIRATORY: Clear with the exception of some occasional wheezes in the  right lung field.  CARDIOVASCULAR: Regular rate and rhythm. No obvious murmurs or rubs noted.  EXTREMITIES: Extremities are without significant edema.  NEUROLOGIC: Cranial nerves as above. Facial symmetry is present. The  patient  has good sensation to facial pinprick and soft touch bilaterally. He has  good strength to facial muscles, muscles of the head turning and  shoulder  shrug bilaterally. Good symmetry of smile. Grimace noted. Protrudes the  tongue in the midline. Good range of movement of the tongue. Speech is  slightly dysarthric. No aphasia noted. Motor testing reveals 4/5 strength on  the right upper extremity. Right upper extremity drift is clearly present.  The patient has good strength of the left arm, good strength in both lower  extremities. No drift to the lower extremities is noted. The patient has  good symmetry to pinprick sensation on all 4's. There may be some decrease  in vibratory sensation on the right leg compared to the left, otherwise  symmetric in the arms. The patient has some ataxia with finger-nose-finger  of the right arm; not present on the left. Normal heel-to-shin bilaterally.  Gait was not tested. Deep tendon reflexes were depressed but symmetric.   NIH stroke scale score is 3 at this time. Initially, when the patient was  seen by the rapid response team, NIH stroke scale was 5.   IMPRESSION:  1.  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  2.  Prior left carotid endarterectomy.   This patient clearly is unstable with waxing and waning clinical examination  at this point. The patient has sustained a stroke by MRI scan and the  symptoms began around 3 p.m. yesterday. The patient is not a t-PA candidate  for this reason. Will initiate heparin therapy given his unstable neurologic  condition and pursue further workup.   PLAN:  1.  IV fluid bolus.  2.  IV fluids, 100 cc an hour of normal saline.  3.  Heparin therapy.  4.  Carotid Doppler study.  5.  2-D echocardiogram.  6.  Will follow clinical course while in house.      Marlan Palau, M.D.  Electronically Signed    CKW/MEDQ  D:  06/04/2005  T:   06/04/2005  Job:  [id number]   cc:   Michaelyn Barter, M.D.

## 2010-08-25 NOTE — Op Note (Signed)
. [facility]  Patient:    Justin Sherman, Justin Sherman Visit Number: [id number] MRN: [id number]          Service Type: SUR Location: [facility] 2899 16 Attending Physician:  Melvenia Needles Dictated by:   Denman George, M.D. Proc. Date: 01/08/01 Admit Date:  01/08/2001   CC:         Doylene Canning. Ladona Ridgel, M.D. [facility]   Operative Report  PREOPERATIVE DIAGNOSIS:  Severe left internal carotid artery stenosis.  POSTOPERATIVE DIAGNOSIS:  Severe left internal carotid artery stenosis.  PROCEDURE:  Left carotid endarterectomy and Dacron patch angioplasty.  SURGEON:  Denman George, M.D.  ASSISTANTS:  Larina Earthly, M.D., and Lissa Merlin, P.A.  ANESTHESIA:  General endotracheal.  ANESTHESIOLOGIST:  Burna Forts, M.D.  CLINICAL NOTE:  This is a 70 year old male who was referred for evaluation of severe left internal carotid artery stenosis by Doppler evaluation.  The patient revealed no symptoms of focal neurologic deficits.  The patient was seen and evaluated, and we recommended he undergo left carotid endarterectomy for reduction of stroke risk.  The patient consented to surgery.  Risks and benefits of the operative procedure were explained in detail, including MI, CVA, and death, approximately 1-2%.  DESCRIPTION OF PROCEDURE:  Patient brought to the operating room in stable condition.  Placed in the supine position.  General endotracheal anesthesia induced.  Foley catheter and arterial line then placed.  Left neck prepped and draped in a sterile fashion.  Curvilinear skin incision made along the anterior border of the left sternomastoid muscle.  Subcutaneous tissue and platysma divided with electrocautery.  Deep dissection carried down to expose the common carotid artery at the omohyoid muscle.  There was plaque extending down into the common carotid artery.  The omohyoid muscle was divided.  The common carotid artery mobilized and encircled with a  vessel loop proximally.  Distal dissection carried up to the carotid bifurcation.  The superior thyroid and external carotid were encircled with vessel loops.  The internal carotid artery followed distally up to the posterior belly of the digastric muscle. The hypoglossal nerve was reflected superiorly and preserved.  The vagus nerve clearly identified and preserved.  The distal internal carotid artery encircled with a vessel loop.  The patient administered 7000 units of heparin intravenously.  Following this, an additional 2000 units of heparin intravenously administered.  The carotid vessels controlled with clamps.  A longitudinal arteriotomy made in the distal common carotid artery.  There was a large plaque with ulceration in the carotid bifurcation with a greater than 90% left internal carotid artery stenosis.  The arteriotomy extended up into the carotid artery origin beyond the plaque.  The common carotid artery was then opened proximally down to the level of the omohyoid muscle.  A shunt was inserted.  An extensive endarterectomy was required.  The endarterectomy carried well down into the common carotid artery, where the plaque was divided transversely with Potts scissors.  The plaque raised up into the bulb, and the superior thyroid and external carotid were endarterectomized using an eversion technique.  The plaque then feathered well out of the internal carotid artery. The intima was noted to be quite thickened distally, although no frank plaque formed.  The common carotid arteriotomy was closed with running 6-0 Prolene suture.  A patch angioplasty of the bifurcation was carried out with a Finesse Hemashield patch using running 6-0 Prolene suture.  The shunt was then removed.  All vessels well-flushed.  Clamps were removed, directing initial antegrade flow up the external carotid artery.  Following this, the internal carotid was released.  Excellent pulse and Doppler  signal of the distal internal carotid artery.  The patient administered 50 mg of protamine intravenously.  Adequate hemostasis obtained.  The sponge, needle, and instrument counts were correct.  The sternomastoid fascia was then closed with running 2-0 Vicryl suture. Platysma closed with running 3-0 Vicryl suture.  Skin closed with 4-0 Monocryl.  Half-inch Steri-Strips applied.  The patient tolerated the procedure well.  Transferred to the recovery room in stable neurologic condition. Dictated by:   Denman George, M.D. Attending Physician:  Melvenia Needles DD:  01/08/01 TD:  01/08/01 Job: [id number]

## 2010-08-25 NOTE — Consult Note (Signed)
NAME:  Justin Sherman, Justin Sherman NO.:  [id number]   MEDICAL RECORD NO.:  [id number]          PATIENT TYPE:  OUT   LOCATION:  XRAY                         FACILITY:  [facility]   PHYSICIAN:  Delton See, P.A.   DATE OF BIRTH:  12/09/1940   DATE OF CONSULTATION:  02/20/2006  DATE OF DISCHARGE:                                   CONSULTATION   CHIEF COMPLAINT:  Cerebrovascular disease.   HISTORY OF PRESENT ILLNESS:  This is a pleasant 70 year old male referred to  Dr. Corliss Skains through the courtesy of Dr. Nicholos Johns.  The patient was  admitted to [facility] in February 2007 with symptoms of a left  CVA.  An MRI/MRA performed June 04, 2005, showed several acute infarcts  in the left middle cerebral artery distribution.  There was a question of  moderate stenosis of the left middle cerebral artery.  The patient had  carotid Dopplers performed that admission that showed significant stenosis  bilaterally.  Fortunately, the patient has very little residual deficits.  He notes some mild right upper extremity numbness and he has had several  episodes of poor balance that occur early in the morning.  He also reports a  fuzziness at times in his head, although overall, he is almost back to  baseline.  The patient presents today to discuss further evaluation and  possible treatment of cerebrovascular disease.   PAST MEDICAL HISTORY:  Significant for diabetes mellitus which has been  controlled through diet.  He has hyperlipidemia.  He has the above noted  CVA.  He has a history of hypertension.  He has a long history of ongoing  tobacco use.  He declined to say how many years he has been smoking although  he has cut down to less than one pack per day at this time. He had a 2-D  echo during his hospitalization earlier this year that showed an ejection  fraction of 60-65%.  The patient has been maintained on Coumadin due to his  CVA and cerebrovascular disease.   PAST  SURGICAL HISTORY:  Significant for a left carotid endarterectomy  performed in 2002 by Dr. Liliane Bade. He is status post tonsillectomy.  He  has had bilateral inguinal hernia repair.  He denies any previous problems  with anesthesia.   ALLERGIES:  No known drug allergies.  He denies contrast dye allergy.  He  denies latex allergy.   CURRENT MEDICATIONS:  Include Zocor 40 mg daily, aspirin 81 mg daily,  Coumadin as directed, Altace 2.5 mg daily, nicotine patches, and Foltx one  daily.   SOCIAL HISTORY:  The patient is divorced.  He has one daughter.  He lives  alone in [place].  He has a history of ongoing tobacco use.  He is down  to less than one pack per day.  He denies alcohol use.  He is retired.   FAMILY HISTORY:  His mother is alive at age 58.  She has had multiple bouts  of cancer.  His father died at age 63 from natural causes.  He had diabetes.   IMPRESSION AND PLAN:  Dr. Corliss Skains reviewed the results of the MRI/MRA of  the head that was performed in February of this year.  Dr. Corliss Skains  recommended further evaluation with a possible cerebral angiogram versus an  MRI/MRA of the head and neck.  Treatment options were discussed such as a  repeat carotid endarterectomy  versus possible stenting. Dr. Corliss Skains recommended further follow-up with  Dr. Madilyn Fireman. Dr. Corliss Skains will also contact the patient's primary care  physician, Dr. Nicholos Johns, to discuss possible options.  Greater than 40  minutes was spent on this consult.      Delton See, P.A.     DR/MEDQ  D:  02/20/2006  T:  02/20/2006  Job:  [id number]   cc:   Georgianne Fick, M.D.  Balinda Quails, M.D.  Marlan Palau, M.D.

## 2010-08-29 NOTE — Consult Note (Signed)
NAME:  Justin Sherman, Justin Sherman NO.:  [id number]  MEDICAL RECORD NO.:  [id number]           PATIENT TYPE:  I  LOCATION:  2107                         FACILITY:  [facility]  PHYSICIAN:  Charlcie Cradle. Delford Field, MD, FCCPDATE OF BIRTH:  [birth year] 1938  DATE OF CONSULTATION:  07/11/2010 DATE OF DISCHARGE:                                CONSULTATION   PRIMARY CARE PHYSICIAN:  Lenon Curt. Chilton Si, MD  CHIEF COMPLAINT:  Fever, nausea, and vomiting.  HISTORY OF PRESENT ILLNESS:  Justin Sherman is a pleasant 70 year old male resident of [facility] who was transferred this morning to emergency department secondary to reported fevers of 102-103 Fahrenheit, nausea and vomiting, inability to tolerate p.o. intake.  The patient has a history of CVA with right-sided hemiparesis, hypertension, diabetes, hyperlipidemia, and COPD.  This history was primarily obtained by a nurse currently taking care of the patient and [facility] Emergency Department, I am not able to obtain meaningful history because of CVA in the past.  The patient is able to respond to yes and no to certain questions.  He denies chest pain, abdominal pain, headaches, blurred vision, shortness of breath, or productive cough.  Again, the patient was not able to give history or why he came to emergency department, but when presented to emergency room he was tachycardic with heart rate of 108-120, temperature of 103.3 Fahrenheit.  Decision was called for admission, code sepsis.  PAST MEDICAL HISTORY: 1. COPD, recent discharge on June 20, 2010 for COPD exacerbation. 2. Hypertension. 3. Hyperlipidemia. 4. History of CVA, with right-sided hemiparesis. 5. Hyperlipidemia. 6. Dysphagia.  HOME MEDICATIONS: 1. Norvasc 5 mg tablet once daily. 2. Mucinex 1 tablet twice daily. 3. Protonix 40 mg tablet daily. 4. Prednisone taper as directed (this therapy is apparently     completed). 5. Ambien 5 mg at bedtime as needed. 6.  Celexa 20 mg tablet at nighttime. 7. Atrovent/albuterol nebulizers q.6 h. and q.3-4 h. as needed for     shortness of breath. 8. Lantus 23 units subcu at bedtime. 9. Multivitamin 1 tablet daily. 10.Oxycodone 5 mg 2 tablets q.4 h. as needed. 11.Phenergan 25 mg tablets every 6 hours as needed. 12.Thiamine 100 mg tablet once daily. 13.Tylenol 2 tablets q.6 h. as needed. 14.Zocor 40 mg tablet at night time daily.  SOCIAL HISTORY:  The patient is apparently a nonsmoker and nondrinker per nursing report, however, the patient's son reports that he is unsure of the current status, believes that the patient occasionally drinks and smokes.  The patient is a nursing home resident.  ALLERGIES:  No known allergies.  REVIEW OF SYSTEMS:  Unable to obtain detailed review of systems analysis since the patient has a history of CVA and is unable to provide detailed history.  FAMILY HISTORY:  Per report noncontributory.  PHYSICAL EXAMINATION:  VITAL SIGNS:  Temperature 103.3 Fahrenheit, pulse 104 beats per minute, blood pressure 84/52 mmHg, respirations 21, and oxygen saturation 98% on 3 L. GENERAL:  This is an frail, elderly male, appears to be in mild distress and is trying to turn to the right side, reportedly feels better when  turned on the right side. HEENT:  Head appears atraumatic.  PERRLA.  Extraocular movements intact. No scleral icterus or conjunctival pallor.  No nasal congestion evident. The patient is able to breathe through nose.  No evident frontal or maxillary sinus tenderness.  Oropharynx is dry with fair dental hygiene. No stigmata of a serial infection in oropharynx.  No oropharyngeal erythema or exudate. NECK:  No JVD.  No cervical lymphadenopathy appreciated.  Old surgical scar noted in the right cervical area.  No thyroid enlargement.  Full range of motion in the neck.  No neck stiffness appreciated. LUNGS:  Reduced air entry bilaterally with forced inspiratory effort. Mild  crackles at baseline.  No tachypnea.  No accessory muscle use for breathing.  Mild expiratory wheezes appreciated with prolonged expiration phase. CVS:  Distant heart sounds, S1 and S2 present.  No murmurs appreciated. No carotid bruits appreciated.  Rhythm is regular.  The patient is tachycardic. ABDOMEN:  Scaphoid, nontender, but appears mildly distended, very soft. Bowel sounds present, unable to appreciate hepatomegaly or splenomegaly. EXTREMITIES:  No pedal edema and no cyanosis.  Peripheral pulses are equal and present bilaterally. SKIN:  Appears pale and dry with post worker, warm to touch extended from lower extremities all the way to forehead. CNS:  The patient is awake and alert, follows commands, and his speech is garbled.  He is only able to answer clearly yes and no.  He has right- sided hemiparesis unable to dorsiflex or plantar flex right lower extremity on exam.  Unable to perform right hand grip as well. Otherwise, neurologic exam was limited but there was no obvious facial asymmetry, eye droop, facial droop, or tongue deviation.  LABORATORY TESTS:  WBC 10.7, hemoglobin 9.7, hematocrit 29.7, MCV 86, platelets 65, and ANC 9.7.  Lactic acid 3, sodium 133, potassium 4.2, chloride 101, bicarb 21, glucose 115, BUN 46, creatinine 3.22.  GFR 19, bilirubin 0.4, alk phos 71, AST 32, ALT 26, total protein 5.1, albumin 2.4, and calcium 8.5.  Procalcitonin 37.89.  Chest x-ray:  Linear atelectasis in the right mid lung with some elevation of the right hemidiaphragm, normal heart size and pulmonary vascularity.  No blunting of costophrenic angles.  ASSESSMENT AND PLAN:  The patient is a 70 year old nursing home resident who presents with a history of cerebrovascular accident, chronic obstructive pulmonary disease, hypertension, hyperlipidemia, diabetes with residual right-sided hemiparesis who was transferred from nursing home to [facility] secondary to fevers, nausea, vomiting,  and inability to tolerate p.o. intake. 1. Fever, with mild leukocytosis, tachycardia, and significantly     elevated procalcitonin suggesting sepsis and currently of unclear     etiology.  We were not able to obtain a detailed history.  It is     not clear what the triggering event for this episode is.  Current     laboratory work suggests acute on chronic renal failure with     dehydration and thrombocytopenia which is new on today's laboratory     work 66 and this is down from 170 done on June 20, 2010, certainly     worrisome for hemolytic uremic syndrome and for that reason we will     have to obtain further laboratory work including repeat CBC,     peripheral smear, LDH, haptoglobin, and coagulation panel.  This     patient has risk factors for cerebrovascular accident and for     cerebrovascular accident, it will be important to obtain CT of the  head and we will discuss this with ICU attending, cardiac etiology     is also considered.  We will check cardiac enzymes and cycle every     8 hours.  His EKGs from emergency department were reviewed and     shows sinus tachycardia.  T-waves changes in lateral leads I and     aVL, however, unchanged from previous EKG, June 14, 2010.  We will     repeat 12-lead EKG.  The patient's last 2-D echo was done in     February 2007 and showed normal left ventricular size with ejection     fraction of 60-65%.  It will be important to obtain urinalysis to     evaluate possibility of urinary tract infection/pyelonephritis.     Per nurse's report in the ED, they were unable to place a Foley.     We will attempt to do so, obtain urinalysis, and urine cultures.     Blood cultures x2 also obtained.  The patient will be started on     broad-spectrum antibiotics, vancomycin and Zosyn pending culture     results.  Currently, he has received 4 L of normal saline bolus     with relatively poor response.  Central line and A-line will need     to be placed  for details blood pressure monitoring and central     venous pressure determination.  I suspect the need for     pharmacologic agent to help with low blood pressure.  We will     monitor urine input and urine output.  We will also obtain other     electrolytes including magnesium and phosphate.  We will continue     hydration and we will repeat chest x-ray in the morning.  The     patient is saturating well on 3 L oxygen on nasal cannula, current     saturations 98-99%.  We will continue the same.  We will also place     the patient on Lovenox 30 mg subcu daily for deep venous thrombosis     prophylaxis and Protonix 40 mg daily IV for gastrointestinal     prophylaxis.  Nebulizers, Atrovent, and albuterol will be given as     needed q.4-6 h.  We will give Phenergan as well as needed for     nausea.     Mliss Sax, MD   ______________________________ Charlcie Cradle. Delford Field, MD, FCCP    IM/MEDQ  D:  07/11/2010  T:  07/12/2010  Job:  [id number]  Electronically Signed by Mliss Sax MD on 08/20/2010 11:04:50 PM Electronically Signed by Shan Levans MD FCCP on 08/29/2010 02:53:55 AM

## 2010-09-19 ENCOUNTER — Inpatient Hospital Stay (HOSPITAL_COMMUNITY)
Admission: EM | Admit: 2010-09-19 | Discharge: 2010-09-22 | DRG: 690 | Disposition: A | Discharge status: Skilled Nursing Facility | Payer: Medicare Other | Attending: Internal Medicine | Admitting: Internal Medicine

## 2010-09-19 ENCOUNTER — Emergency Department (HOSPITAL_COMMUNITY): Payer: Medicare Other

## 2010-09-19 DIAGNOSIS — I714 Abdominal aortic aneurysm, without rupture, unspecified: Secondary | ICD-10-CM | POA: Diagnosis present

## 2010-09-19 DIAGNOSIS — N3000 Acute cystitis without hematuria: Principal | ICD-10-CM | POA: Diagnosis present

## 2010-09-19 DIAGNOSIS — Z794 Long term (current) use of insulin: Secondary | ICD-10-CM

## 2010-09-19 DIAGNOSIS — N189 Chronic kidney disease, unspecified: Secondary | ICD-10-CM | POA: Diagnosis present

## 2010-09-19 DIAGNOSIS — Z7982 Long term (current) use of aspirin: Secondary | ICD-10-CM

## 2010-09-19 DIAGNOSIS — F341 Dysthymic disorder: Secondary | ICD-10-CM | POA: Diagnosis present

## 2010-09-19 DIAGNOSIS — E119 Type 2 diabetes mellitus without complications: Secondary | ICD-10-CM | POA: Diagnosis present

## 2010-09-19 DIAGNOSIS — J440 Chronic obstructive pulmonary disease with acute lower respiratory infection: Secondary | ICD-10-CM | POA: Diagnosis present

## 2010-09-19 DIAGNOSIS — I69959 Hemiplegia and hemiparesis following unspecified cerebrovascular disease affecting unspecified side: Secondary | ICD-10-CM

## 2010-09-19 DIAGNOSIS — J209 Acute bronchitis, unspecified: Secondary | ICD-10-CM | POA: Diagnosis present

## 2010-09-19 DIAGNOSIS — Z79899 Other long term (current) drug therapy: Secondary | ICD-10-CM

## 2010-09-19 DIAGNOSIS — I129 Hypertensive chronic kidney disease with stage 1 through stage 4 chronic kidney disease, or unspecified chronic kidney disease: Secondary | ICD-10-CM | POA: Diagnosis present

## 2010-09-19 LAB — TROPONIN I: Troponin I: <0.3 ng/mL (ref <0.30)

## 2010-09-19 LAB — COMPREHENSIVE METABOLIC PANEL
ALT: 9 U/L (ref 0–53)
AST: 14 U/L (ref 0–37)
Albumin: 3.2 g/dL — ABNORMAL LOW (ref 3.5–5.2)
CO2: 30 mEq/L (ref 19–32)
Chloride: 102 mEq/L (ref 96–112)
Creatinine, Ser: 1.44 mg/dL (ref 0.4–1.5)
Sodium: 138 mEq/L (ref 135–145)
Total Bilirubin: 0.1 mg/dL — ABNORMAL LOW (ref 0.3–1.2)

## 2010-09-19 LAB — CBC
HCT: 33.4 % — ABNORMAL LOW (ref 39.0–52.0)
Hemoglobin: 10.5 g/dL — ABNORMAL LOW (ref 13.0–17.0)
MCH: 28.3 pg (ref 26.0–34.0)
MCHC: 31.4 g/dL (ref 30.0–36.0)
MCV: 90 fL (ref 78.0–100.0)
RDW: 13.9 % (ref 11.5–15.5)

## 2010-09-19 LAB — DIFFERENTIAL
Basophils Absolute: 0 10*3/uL (ref 0.0–0.1)
Eosinophils Relative: 11 % — ABNORMAL HIGH (ref 0–5)
Lymphocytes Relative: 13 % (ref 12–46)
Lymphs Abs: 1.5 10*3/uL (ref 0.7–4.0)
Monocytes Absolute: 0.5 10*3/uL (ref 0.1–1.0)
Monocytes Relative: 4 % (ref 3–12)
Neutro Abs: 8.7 10*3/uL — ABNORMAL HIGH (ref 1.7–7.7)

## 2010-09-19 LAB — CK TOTAL AND CKMB (NOT AT ARMC): Total CK: 81 U/L (ref 7–232)

## 2010-09-19 LAB — PRO B NATRIURETIC PEPTIDE: Pro B Natriuretic peptide (BNP): 599.5 pg/mL — ABNORMAL HIGH (ref 0–125)

## 2010-09-20 ENCOUNTER — Inpatient Hospital Stay (HOSPITAL_COMMUNITY): Payer: Medicare Other

## 2010-09-20 LAB — CARDIAC PANEL(CRET KIN+CKTOT+MB+TROPI)
Relative Index: INVALID (ref 0.0–2.5)
Relative Index: INVALID (ref 0.0–2.5)
Troponin I: <0.3 ng/mL (ref <0.30)
Troponin I: <0.3 ng/mL (ref <0.30)

## 2010-09-20 LAB — URINALYSIS, ROUTINE W REFLEX MICROSCOPIC
Nitrite: NEGATIVE
Specific Gravity, Urine: 1.04 — ABNORMAL HIGH (ref 1.005–1.030)
Urobilinogen, UA: 0.2 mg/dL (ref 0.0–1.0)
pH: 5.5 (ref 5.0–8.0)

## 2010-09-20 LAB — URINE MICROSCOPIC-ADD ON

## 2010-09-20 LAB — CBC
HCT: 33.5 % — ABNORMAL LOW (ref 39.0–52.0)
Hemoglobin: 10.4 g/dL — ABNORMAL LOW (ref 13.0–17.0)
MCV: 89.8 fL (ref 78.0–100.0)
RBC: 3.73 MIL/uL — ABNORMAL LOW (ref 4.22–5.81)
WBC: 11.6 10*3/uL — ABNORMAL HIGH (ref 4.0–10.5)

## 2010-09-20 LAB — COMPREHENSIVE METABOLIC PANEL
ALT: 8 U/L (ref 0–53)
AST: 11 U/L (ref 0–37)
Albumin: 2.8 g/dL — ABNORMAL LOW (ref 3.5–5.2)
Alkaline Phosphatase: 73 U/L (ref 39–117)
Chloride: 104 mEq/L (ref 96–112)
Potassium: 4.4 mEq/L (ref 3.5–5.1)
Sodium: 139 mEq/L (ref 135–145)
Total Bilirubin: 0.1 mg/dL — ABNORMAL LOW (ref 0.3–1.2)

## 2010-09-20 LAB — GLUCOSE, CAPILLARY
Glucose-Capillary: 200 mg/dL — ABNORMAL HIGH (ref 70–99)
Glucose-Capillary: 166 mg/dL — ABNORMAL HIGH (ref 70–99)

## 2010-09-20 LAB — LIPID PANEL: Cholesterol: 116 mg/dL (ref 0–200)

## 2010-09-20 LAB — MRSA PCR SCREENING: MRSA by PCR: NEGATIVE

## 2010-09-20 MED ORDER — IOHEXOL 300 MG/ML  SOLN
100.0000 mL | Freq: Once | INTRAMUSCULAR | Status: AC | PRN
  Administered 2010-09-20: 100 mL via INTRAVENOUS

## 2010-09-20 MED ORDER — IOHEXOL 300 MG/ML  SOLN
70.0000 mL | Freq: Once | INTRAMUSCULAR | Status: AC | PRN
  Administered 2010-09-20: 70 mL via INTRAVENOUS

## 2010-09-21 LAB — GLUCOSE, CAPILLARY: Glucose-Capillary: 112 mg/dL — ABNORMAL HIGH (ref 70–99)

## 2010-09-21 LAB — CBC
Hemoglobin: 9.9 g/dL — ABNORMAL LOW (ref 13.0–17.0)
MCH: 27.7 pg (ref 26.0–34.0)
MCV: 88.5 fL (ref 78.0–100.0)
RBC: 3.57 MIL/uL — ABNORMAL LOW (ref 4.22–5.81)

## 2010-09-21 LAB — BASIC METABOLIC PANEL
CO2: 28 mEq/L (ref 19–32)
Calcium: 9.5 mg/dL (ref 8.4–10.5)
Glucose, Bld: 125 mg/dL — ABNORMAL HIGH (ref 70–99)
Sodium: 138 mEq/L (ref 135–145)

## 2010-09-22 LAB — CBC
MCH: 27.3 pg (ref 26.0–34.0)
MCHC: 30.6 g/dL (ref 30.0–36.0)
Platelets: 193 10*3/uL (ref 150–400)
RDW: 14 % (ref 11.5–15.5)

## 2010-09-22 LAB — URINE CULTURE

## 2010-09-22 LAB — GLUCOSE, CAPILLARY: Glucose-Capillary: 95 mg/dL (ref 70–99)

## 2010-09-25 ENCOUNTER — Emergency Department (HOSPITAL_COMMUNITY): Payer: Medicare Other

## 2010-09-25 ENCOUNTER — Emergency Department (HOSPITAL_COMMUNITY)
Admission: EM | Admit: 2010-09-25 | Discharge: 2010-09-25 | Disposition: A | Discharge status: Home / Self Care | Payer: Medicare Other | Attending: Emergency Medicine | Admitting: Emergency Medicine

## 2010-09-25 DIAGNOSIS — J40 Bronchitis, not specified as acute or chronic: Secondary | ICD-10-CM | POA: Insufficient documentation

## 2010-09-25 DIAGNOSIS — E785 Hyperlipidemia, unspecified: Secondary | ICD-10-CM | POA: Insufficient documentation

## 2010-09-25 DIAGNOSIS — E119 Type 2 diabetes mellitus without complications: Secondary | ICD-10-CM | POA: Insufficient documentation

## 2010-09-25 DIAGNOSIS — F3289 Other specified depressive episodes: Secondary | ICD-10-CM | POA: Insufficient documentation

## 2010-09-25 DIAGNOSIS — Z7982 Long term (current) use of aspirin: Secondary | ICD-10-CM | POA: Insufficient documentation

## 2010-09-25 DIAGNOSIS — R05 Cough: Secondary | ICD-10-CM | POA: Insufficient documentation

## 2010-09-25 DIAGNOSIS — Z8673 Personal history of transient ischemic attack (TIA), and cerebral infarction without residual deficits: Secondary | ICD-10-CM | POA: Insufficient documentation

## 2010-09-25 DIAGNOSIS — I10 Essential (primary) hypertension: Secondary | ICD-10-CM | POA: Insufficient documentation

## 2010-09-25 DIAGNOSIS — Z794 Long term (current) use of insulin: Secondary | ICD-10-CM | POA: Insufficient documentation

## 2010-09-25 DIAGNOSIS — J4489 Other specified chronic obstructive pulmonary disease: Secondary | ICD-10-CM | POA: Insufficient documentation

## 2010-09-25 DIAGNOSIS — J449 Chronic obstructive pulmonary disease, unspecified: Secondary | ICD-10-CM | POA: Insufficient documentation

## 2010-09-25 DIAGNOSIS — Z9981 Dependence on supplemental oxygen: Secondary | ICD-10-CM | POA: Insufficient documentation

## 2010-09-25 DIAGNOSIS — R059 Cough, unspecified: Secondary | ICD-10-CM | POA: Insufficient documentation

## 2010-09-25 DIAGNOSIS — F329 Major depressive disorder, single episode, unspecified: Secondary | ICD-10-CM | POA: Insufficient documentation

## 2010-09-25 DIAGNOSIS — Z79899 Other long term (current) drug therapy: Secondary | ICD-10-CM | POA: Insufficient documentation

## 2010-09-25 LAB — TROPONIN I: Troponin I: <0.3 ng/mL (ref <0.30)

## 2010-09-25 LAB — DIFFERENTIAL
Eosinophils Absolute: 1.6 10*3/uL — ABNORMAL HIGH (ref 0.0–0.7)
Eosinophils Relative: 7 % — ABNORMAL HIGH (ref 0–5)
Lymphocytes Relative: 6 % — ABNORMAL LOW (ref 12–46)
Lymphs Abs: 1.3 10*3/uL (ref 0.7–4.0)
Monocytes Relative: 3 % (ref 3–12)

## 2010-09-25 LAB — BASIC METABOLIC PANEL
Calcium: 10 mg/dL (ref 8.4–10.5)
Creatinine, Ser: 1.43 mg/dL — ABNORMAL HIGH (ref 0.50–1.35)
GFR calc non Af Amer: 49 mL/min — ABNORMAL LOW (ref >60)
Sodium: 136 mEq/L (ref 135–145)

## 2010-09-25 LAB — CBC
HCT: 35.3 % — ABNORMAL LOW (ref 39.0–52.0)
MCH: 28.8 pg (ref 26.0–34.0)
MCV: 89.8 fL (ref 78.0–100.0)
Platelets: 192 10*3/uL (ref 150–400)
RDW: 14.3 % (ref 11.5–15.5)

## 2010-10-01 NOTE — Discharge Summary (Signed)
NAMEVAYDEN, WEINAND NO.:  [id number]  MEDICAL RECORD NO.:  [id number]  LOCATION:  2039                         FACILITY:  [facility]  PHYSICIAN:  Ladell Pier, M.D.   DATE OF BIRTH:  1940-11-19  DATE OF ADMISSION:  09/19/2010 DATE OF DISCHARGE:  09/22/2010                              DISCHARGE SUMMARY   DISCHARGE DIAGNOSES: 1. Acute cystitis.  Resistant to all antibiotics except gentamicin and     Primaxin. 2. Acute bronchitis/chronic obstructive pulmonary disease     exacerbation. 3. Dyspnea secondary to acute bronchitis/chronic obstructive pulmonary     disease exacerbation. 4. Diabetes. 5. Abdominal pain that resolved. 6. History of cerebrovascular accident with right-sided hemiparesis. 7. Hypertension. 8. History of anemia. 9. History of chronic kidney disease. 10.History of Mobitz type 2 arrhythmia. 11.History of sepsis from urinary tract infection Proteus mirabilis in     April 2012, which he developed acute respiratory distress syndrome. 12.History of admission for healthcare-associated pneumonia. 13.Abdominal aortic aneurysm 3.6 cm, follow up for monitoring per PCP.  DISCHARGE MEDICATIONS: 1. Albuterol nebulizer q.2 h. p.r.n. for shortness of breath. 2. Primaxin 500 mg IV q.6 h. for 5 days. 3. ProSource 30 mg by mouth daily. 4. Aspirin 81 mg daily. 5. Celexa 20 mg at bedtime. 6. DuoNeb inhale q.6 h. 7. Iron sulfate 325 mg twice daily. 8. Guaifenesin XR 600 mg twice daily. 9. Lantus 5 units at bedtime. 10.Lorazepam 0.5 mg daily as needed. 11.Multivitamin daily. 12.Neosporin twice daily as needed topically for rash. 13.NovoLog sliding scale 3 times a day with meals as per nursing home     sliding scale protocol. 14.Oxycodone 5 mg 2 tablets every 4 hours as needed. 15.Protonix 40 mg daily. 16.Phenergan 25 mg every 6 hours as needed. 17.Proteinex 30 mL daily. 18.Thiamine 100 mg daily. 19.Tylenol 2 tablets every 6 hours as needed. 20.Zocor  40 mg at bedtime. 21.Ambien 5 mg at bedtime p.r.n.  FOLLOWUP APPOINTMENTS:  The patient discharged to skilled nursing facility to follow up with Dr. Chilton Si.  PROCEDURES:  CT scan of the chest, moderate artifact limiting evaluation of small pulmonary emboli, no pulmonary emboli are identified.  No evidence of pulmonary edema or acute finding.  Chest x-ray, no active cardiopulmonary disease.  CT scan of the abdomen and pelvis, small hepatic cyst, infrarenal abdominal aortic aneurysm 3.6 cm maximum diameter.  CONSULTANTS:  None.  HISTORY OF PRESENT ILLNESS:  The patient is a 70 year old male with known history of stroke with right hemiparesis, she also has diabetes, and chronic kidney disease was hospitalized 2 months ago where he was treated for healthcare-associated pneumonia.  Previously, also admitted for sepsis from UTI.  He was treated outpatient for respiratory infection with Avelox.  He began to complain of some chest pain, abdominal pain, and shortness of breath, so EMS went over and brought the patient to the emergency room.  In the ER, the patient states he had a chest pain that has now resolved and was complaining of some abdominal pain.  Initially, he said that the abdominal pain was in the epigastric area, later on he pointed to the left lower quadrant.  Please see admission note for remainder of history.  PAST MEDICAL HISTORY:  Per admission H and P.  FAMILY HISTORY:  Per admission H and P.  SOCIAL HISTORY:  Per admission H and P.  MEDICATIONS:  Per admission H and P.  ALLERGIES:  Per admission H and P.  REVIEW OF SYSTEMS:  Per admission H and P.  PHYSICAL EXAMINATION:  VITAL SIGNS:  At the time of discharge temperature 98.2, pulse 64, respirations 18, blood pressure 110/60, and pulse ox 92% on 2 L. GENERAL:  The patient is sitting up in chair, well-nourished white male. HEENT:  Normocephalic, atraumatic.  Pupils are reactive to light  without erythema. CARDIOVASCULAR:  Regular rate  and rhythm. LUNGS:  Clear bilaterally. ABDOMEN:  Positive bowel sounds. EXTREMITIES:  Without edema.  HOSPITAL COURSE: 1. Acute bronchitis:  The patient was admitted to the hospital.  He     was placed on antibiotics and breathing treatments.  The patient     did initially have some rhonchi on exam, but he denied using any     tobacco.  On discussing however with the son, he states that he did     smoke until about a year or two ago when he has a stroke, then he     quit smoking after that.  The patient's lungs today is clear, so     did not start him on any steroids.  We will discharge him on the     Primaxin and on neb treatments. 2. Acute cystitis:  The patient to have a urine culture done and urine     culture did grow out Klebsiella pneumoniae, ESBL that is only     sensitive to gentamicin and Primaxin.  The patient will be     discharged on Primaxin for another 5 days. 3. Diabetes.  The patient will continue with sliding scale insulin. 4. Abdominal aortic aneurysm, that should be followed up by his PCP. 5. Abdominal pain.  He did complain initially of some abdominal pain,     most likely secondary to the bladder infection.  He had a CT scan     of the abdomen done that was negative. 6. Hypertension.  Blood pressure has been stable throughout the     hospitalization.  LABORATORY DATA:  At the time of discharge, urine culture has mentioned Klebsiella pneumoniae.  WBC 8.1, hemoglobin 9.7, MCV 89.3, and platelets 193.  Sodium 138, potassium 3.8, chloride 103, CO2 of 28, BUN 24, creatinine 1.4, and glucose 125.  Cardiac markers negative.  Time spent with the patient and doing this discharge is approximately 45 minutes, also discussed with his son.     Ladell Pier, M.D.     [place]/MEDQ  D:  09/22/2010  T:  09/22/2010  Job:  [id number]  Electronically Signed by Ladell Pier M.D. on 10/01/2010 82:95:62 PM

## 2010-10-02 NOTE — H&P (Signed)
NAME:  Justin Sherman, Justin Sherman NO.:  [id number]  MEDICAL RECORD NO.:  [id number]  LOCATION:  [facility]                         FACILITY:  [facility]  PHYSICIAN:  Eduard Clos, MDDATE OF BIRTH:  1941/02/25  DATE OF ADMISSION:  09/19/2010 DATE OF DISCHARGE:                             HISTORY & PHYSICAL   PRIMARY CARE PHYSICIAN:  Lenon Curt. Green, MD  CHIEF COMPLAINT:  Chest pain, shortness of breath, abdominal pain.  HISTORY OF PRESENT ILLNESS:  A 70 year old male with known history of CVA with right-sided hemiparesis, diabetes mellitus type 2, chronic kidney disease who was recently in the hospital 2 months ago where he was treated for healthcare-associated pneumonia and previously in April was also admitted for sepsis from UTI, was recently being treated for upper respiratory infection and is on Avelox.  Last evening, the patient suddenly had a complaint of chest pain, abdominal pain, and shortness of breath.  The EMS went over there and was placed on 100% nonrebreather, was brought to the ER.  In the ER, the patient says he had a chest pain, has resolved and was complaining of abdominal pain.  Initially, he told the abdominal pain was in the epigastric area, later on he pointed to left lower quadrant.  At this time, labs were showing mild leukocytosis, otherwise nonspecific changes.  The patient had a CT of abdomen and pelvis which at this time does not show any acute except for abdominal aortic aneurysm which he will need followup with a vascular surgeon. The patient at this time admitted for his shortness of breath, chest pain, abdominal pain for further observation.  Presently, the patient is not having any chest pain, shortness of breath, or abdominal pain.  He was on 6 liters oxygen, presently he is on 2 liters.  The patient denies any nausea, vomiting.  Denies any pain related to the foot.  Denies any abdominal pain.  At this time, denies any dysuria, discharge,  or diarrhea.  The patient has previous history of CVA with right-sided hemiparesis and expressive aphasia.  The patient presently is not in any acute distress.  PAST MEDICAL HISTORY:  History of CVA with right-sided hemiparesis and expressive aphasia, history of diabetes mellitus type 2, hypertension, history of anemia, history of chronic kidney disease, history of Mobitz type 2 arrhythmia, history of sepsis from UTI Proteus mirabilis in April of 2012, which developed ARDS. 1. History of recent admission when patient had a healthcare-     associated pneumonia.  MEDICATIONS PRIOR TO ADMISSION: 1. The patient is on zolpidem 5 mg p.o. daily. 2. Zocor 40 mg. 3. Tylenol. 4. Thiamine 100 mg p.o. daily. 5. Proteinex. 6. Phenergan p.r.n. 7. Protonix 40 mg p.o. daily. 8. Oxycodone 5 mg 2 tablets q.4. 9. NovoLog. 10.Multivitamins. 11.Lorazepam 0.5 mg as needed. 12.Lantus insulin 500 subcutaneous at bedtime. 13.Guaifenesin. 14.Ferrous sulfate. 15.Asthma 81 mg daily. 16.DuoNebs. 17.Avelox. 18.Celexa. 19.Neomycin.  ALLERGIES:  No known drug allergies.  FAMILY HISTORY:  Noncontributory.  SOCIAL HISTORY:  The patient lives at nursing home, is a full code. Denies smoking cigarettes, drinking alcohol, or use of illegal drugs.  REVIEW OF SYSTEMS:  As per history of present illness, nothing else significant.  PHYSICAL EXAMINATION:  GENERAL:  The patient examined at bedside, lying down comfortably on a bed.  At this time, denies any chest pain, shortness of breath, or abdominal pain. VITAL SIGNS:  Blood pressure is 120/60, pulse 98 per minute, temperature 98.2, respiration is around 24 per minute, O2 sat 99% on 2 liters. HEENT:  Anicteric.  No pallor.  No discharge from ears, eyes, nose, or mouth. CHEST:  Bilateral air entry present.  No rhonchi, no crepitation. HEART:  S1 and S2 heard. ABDOMEN:  Soft, nontender.  Bowel sounds heard. CNS:  Alert, awake, and oriented to time, place,  and person.  The patient does have some expressive aphasia, but is able to talk at this time.  He is not able to move his right upper extremity.  He is able to move his right lower extremity minimally.  His left upper and left lower extremity, he is able to move, I do not see any obvious facial asymmetry at this time.  Tongue is midline. EXTREMITIES:  Peripheral pulses felt.  No edema.  LABORATORY DATA:  EKG showing junctional rhythm, beats around 130 beats per minute, but at this time the monitor is showing sinus rhythm with EKG showing nonspecific ST-T changes.  Heart rate is around 96 beats per minute.  Chest x-ray shows no acute cardiopulmonary process, hyperinflation, and bronchitic changes.  CT abdomen and pelvis with contrast media shows small hepatic cyst, infrarenal abdominal aortic aneurysm 3.6 cm maximum diameter.  No evidence for abscess.  Bowel obstruction.  No acute appendicitis.  CBC, WBC is 12.1, hemoglobin is 10.5, hematocrit is 33.4, platelets 208, eosinophils are 11%.  Complete metabolic panel, sodium 138, potassium 4.3, chloride 102, carbon dioxide 30, glucose 121, BUN 24, creatinine 1.4, alkaline phosphatase 72, AST 14, ALT 9, total protein 6.7, albumin 3.2, calcium 9.4, lactic acid 1, lipase 52.  CK is 81, CK-MB 3.5, troponin-I less than 0.3, BNP is 599.5. Fecal occult blood is negative.  ASSESSMENT: 1. Chest pain with shortness of breath and abdominal pain. 2. Mild leukocytosis. 3. Abdominal aortic aneurysm 3.6 cm in diameter. 4. Eosinophilia. 5. History of cerebrovascular accident with right-sided hemiparesis. 6. History of diabetes mellitus type 2. 7. Chronic kidney disease. 8. History of hypertension.  PLAN: 1. At this time, I will admit patient to step-down unit. 2. For his chest pain, shortness of breath, and abdominal pain this     time, the patient is asymptomatic.  We are going to cycle cardiac     markers.  We will continue with his nebulizer.  The  patient was     recently treated on Avelox for his upper respiratory infection.  I     am going to place patient on Levaquin.  At this time, we do not     have a urinalysis.  We are going to get a urinalysis and urine     culture.  I am going to repeat a chest x-ray again in a.m.  I am     going to get a stat D-dimer.  At this time, the patient has refused     to get ABG.  Based on these test, we will further plan his further     course. 3. The patient does have abdominal aortic aneurysm which I looking     back to the charts has not been mentioned before.  We will have an     opinion from vascular surgeon on this and he will need definite  followup on this as outpatient. 4. Further recommendation as condition evolves.  This patient is a     full code.     Eduard Clos, MD     ANK/MEDQ  D:  09/20/2010  T:  09/20/2010  Job:  [id number]  cc:   Lenon Curt. Chilton Si, M.D.  Electronically Signed by Midge Minium MD on 10/02/2010 07:33:41 AM

## 2011-03-17 ENCOUNTER — Other Ambulatory Visit: Payer: Self-pay

## 2011-03-17 ENCOUNTER — Emergency Department (HOSPITAL_COMMUNITY): Payer: Medicare Other

## 2011-03-17 ENCOUNTER — Emergency Department (HOSPITAL_COMMUNITY)
Admission: EM | Admit: 2011-03-17 | Discharge: 2011-03-17 | Disposition: A | Discharge status: Skilled Nursing Facility | Payer: Medicare Other | Attending: Emergency Medicine | Admitting: Emergency Medicine

## 2011-03-17 ENCOUNTER — Encounter: Payer: Self-pay | Admitting: *Deleted

## 2011-03-17 DIAGNOSIS — IMO0001 Reserved for inherently not codable concepts without codable children: Secondary | ICD-10-CM

## 2011-03-17 HISTORY — DX: Pressure ulcer of other site, unspecified stage: L89.899

## 2011-03-17 HISTORY — DX: Hyperlipidemia, unspecified: E78.5

## 2011-03-17 HISTORY — DX: Gastro-esophageal reflux disease without esophagitis: K21.9

## 2011-03-17 HISTORY — DX: Unspecified disturbances of skin sensation: I69.998

## 2011-03-17 HISTORY — DX: Major depressive disorder, single episode, unspecified: F32.9

## 2011-03-17 HISTORY — DX: Unspecified dementia, unspecified severity, without behavioral disturbance, psychotic disturbance, mood disturbance, and anxiety: F03.90

## 2011-03-17 HISTORY — DX: Unspecified disturbances of skin sensation: R20.9

## 2011-03-17 HISTORY — DX: Chronic obstructive pulmonary disease with (acute) exacerbation: J44.1

## 2011-03-17 HISTORY — DX: Aortic aneurysm of unspecified site, without rupture: I71.9

## 2011-03-17 HISTORY — DX: Type 2 diabetes mellitus without complications: E11.9

## 2011-03-17 HISTORY — DX: Unspecified acute conjunctivitis, unspecified eye: H10.30

## 2011-03-17 HISTORY — DX: Flaccid hemiplegia affecting unspecified side: G81.00

## 2011-03-17 HISTORY — DX: Generalized anxiety disorder: F41.1

## 2011-03-17 HISTORY — DX: Essential (primary) hypertension: I10

## 2011-03-17 HISTORY — DX: Other specified depressive episodes: F32.89

## 2011-03-17 LAB — POCT I-STAT TROPONIN I: Troponin i, poc: 0.01 ng/mL (ref 0.00–0.08)

## 2011-03-17 MED ORDER — SODIUM CHLORIDE 0.9 % IV BOLUS (SEPSIS)
500.0000 mL | Freq: Once | INTRAVENOUS | Status: AC
  Administered 2011-03-17: 500 mL via INTRAVENOUS

## 2011-03-17 MED ORDER — LIDOCAINE HCL 1 % IJ SOLN
INTRAMUSCULAR | Status: AC
  Administered 2011-03-17: 22:00:00
  Filled 2011-03-17: qty 20

## 2011-03-17 NOTE — ED Notes (Signed)
Pt comes from SNF after witnesses fall with no LOC.  Pt is not communicative, but according to chart, this is his baseline post CVA.  Pt does not respond when asked if he is in pain.  Pt's lung sounds are diminished, but he would not take deep breaths when asked to do so by RN.  Pt's bowel sounds are present and abdomen is soft.  Pt does not grimace or respond to pain with passive ROM in arms and legs.

## 2011-03-17 NOTE — ED Notes (Signed)
After taking radiology images, notified by radiology that finding do not match with previous scans, however, all the information, papers work and numbers are matching with what is provided, numbers are rechecked with registration, no mistake noted, SNF called to verify if by any chance they sent a wrong pt/papers. Upon multiple attempts night weekend supervisor answered the phone. When asked to confirm the wright pt Eunice Blase states that Mr. Mehtaab Mayeda is in his room sleeping. Further she explains that actually pt that we have in our care, ans that is sent by SNF nurse, is Mr. Danella Maiers. EDP and PA notified, registation and radiology. At this time waiting for the correct information to be faxed. This pt will be d/c and new information with be entered and pt will be arrived.

## 2011-03-17 NOTE — ED Provider Notes (Signed)
History     **PLEASE DISREGARD THIS NOTE. ALL OF THE BELOW DOCUMENTATION IS ON THE INCORRECT PATIENT.**  CSN: [id number] Arrival date & time: 03/17/2011  2:35 PM   First MD Initiated Contact with Patient 03/17/11 1639      Chief Complaint  Patient presents with  . Fall    (Consider location/radiation/quality/duration/timing/severity/associated sxs/prior treatment) Patient is a 70 y.o. male presenting with fall. The history is provided by the nursing home. The history is limited by the absence of a caregiver (Patient has dementia, no caregiver present).  Fall  Per EMS, pt presents from [facility] facility. He apparently had a witnessed fall this afternoon; his head struck the floor and he suffered a laceration to his L temple. He did not lose consciousness and had no nausea or vomiting. EMS was called to transport him to [facility] ED for further eval/tx.  Past Medical History  Diagnosis Date  . Alterations of sensations, late effect of cerebrovascular disease   . Acute conjunctivitis, unspecified   . Aortic aneurysm of unspecified site without mention of rupture   . Flaccid hemiplegia affecting dominant side   . Type II or unspecified type diabetes mellitus without mention of complication, not stated as uncontrolled   . Depressive disorder, not elsewhere classified   . Unspecified essential hypertension   . Obstructive chronic bronchitis with exacerbation   . Anxiety state, unspecified   . Esophageal reflux   . Other and unspecified hyperlipidemia   . Pressure ulcer, other site   . Dementia     History reviewed. No pertinent past surgical history.  History reviewed. No pertinent family history.  History  Substance Use Topics  . Smoking status: Not on file  . Smokeless tobacco: Not on file  . Alcohol Use: No      Review of Systems  Unable to perform ROS: Dementia    Allergies  Review of patient's allergies indicates no known allergies.  Home Medications    Current Outpatient Rx  Name Route Sig Dispense Refill  . ACETAMINOPHEN 325 MG PO TABS Oral Take 650 mg by mouth every 4 (four) hours as needed. For pain     . ALBUTEROL SULFATE (5 MG/ML) 0.5% IN NEBU Nebulization Take 2.5 mg by nebulization every 2 (two) hours as needed. For shortness of breath     . ASPIRIN EC 81 MG PO TBEC Oral Take 81 mg by mouth daily.      Marland Kitchen CITALOPRAM HYDROBROMIDE 20 MG PO TABS Oral Take 20 mg by mouth at bedtime.      Marland Kitchen FERROUS SULFATE 325 (65 FE) MG PO TABS Oral Take 325 mg by mouth daily with breakfast.      . FLUTICASONE-SALMETEROL 250-50 MCG/DOSE IN AEPB Inhalation Inhale 1 puff into the lungs every 12 (twelve) hours.      . INSULIN ASPART 100 UNIT/ML [place] SOLN Subcutaneous Inject 5 Units into the skin 4 (four) times daily -  before meals and at bedtime.      . INSULIN GLARGINE 100 UNIT/ML [place] SOLN Subcutaneous Inject 5 Units into the skin at bedtime.      . IPRATROPIUM-ALBUTEROL 0.5-2.5 (3) MG/3ML IN SOLN Nebulization Take 3 mLs by nebulization every 6 (six) hours as needed. For shortness of breath     . LORAZEPAM 0.5 MG PO TABS Oral Take 0.25 mg by mouth every 8 (eight) hours as needed. For anxiety     . THERA M PLUS PO TABS Oral Take 1 tablet by mouth daily.      Marland Kitchen  OMEPRAZOLE 20 MG PO CPDR Oral Take 20 mg by mouth daily.      . OXYCODONE HCL 5 MG PO TABS Oral Take 10 mg by mouth every 4 (four) hours as needed. For pain     . SIMVASTATIN 40 MG PO TABS Oral Take 40 mg by mouth at bedtime.      . THIAMINE HCL 100 MG PO TABS Oral Take 100 mg by mouth daily.      . GUAIFENESIN ER 600 MG PO TB12 Oral Take 600 mg by mouth 2 (two) times daily.      Marland Kitchen PROMETHAZINE HCL 25 MG PO TABS Oral Take 25 mg by mouth every 6 (six) hours as needed. For nausea       BP 130/50  Pulse 56  Temp(Src) 98.5 F (36.9 C) (Oral)  Resp 17  SpO2 99%  Physical Exam  Nursing note and vitals reviewed. Constitutional: He appears well-developed and well-nourished. No distress.  HENT:  Head:  Normocephalic and atraumatic.  Right Ear: External ear normal.  Left Ear: External ear normal.  Nose: Nose normal.  Mouth/Throat: Oropharynx is clear and moist. No oropharyngeal exudate.  Eyes: Conjunctivae and EOM are normal. Pupils are equal, round, and reactive to light.  Neck: Normal range of motion. Neck supple.  Cardiovascular: Normal rate, regular rhythm and normal heart sounds.   Pulmonary/Chest: Breath sounds normal. He has no wheezes.       Will not take a deep breath when asked. Given effort, seems to be moving air appropriately.  Abdominal: Soft. Bowel sounds are normal. He exhibits no distension and no mass. There is no tenderness. There is no rebound and no guarding.       No grimace with palpation.  Musculoskeletal: He exhibits no edema and no tenderness.       Spine: No palpable stepoff, crepitus, deformity noted. Patient with no tenderness to palpation over spine. No palpable paraspinal spasm.  Neurological: He has normal reflexes. GCS eye subscore is 3. GCS verbal subscore is 2. GCS motor subscore is 5.       Pt nonverbal, unable to perform full neuro exam. PERRLA, pt will follow with eyes.  Skin: Skin is warm and dry. No rash noted. He is not diaphoretic.    ED Course  Procedures (including critical care time)  LACERATION REPAIR Performed by: Grant Fontana Authorized by: Grant Fontana Consent: Verbal consent obtained. Risks and benefits: risks, benefits and alternatives were discussed Consent given by: patient Patient identity confirmed: provided demographic data Prepped and Draped in normal sterile fashion Wound explored  Laceration Location: L temple  Laceration Length: 3cm  No Foreign Bodies seen or palpated  Anesthesia: local infiltration  Local anesthetic: lidocaine 1% without epinephrine  Anesthetic total: 3 ml  Irrigation method: syringe Amount of cleaning: standard  Skin closure: 6-0 Prolene  Number of sutures: 4  Technique:  simple interrupted  Patient tolerance: Patient tolerated the procedure well with no immediate complications.    Date: 03/17/2011  Rate: 43  Rhythm: sinus bradycardia  QRS Axis: normal  Intervals: normal  ST/T Wave abnormalities: normal  Conduction Disutrbances:none  Narrative Interpretation:   Old EKG Reviewed: ECG on 09/19/10 indicates LAD, borderline repol abnormality, normal rate     Labs Reviewed  POCT I-STAT TROPONIN I  I-STAT TROPONIN I   Dg Chest 2 View  03/17/2011  *RADIOLOGY REPORT*  Clinical Data: Diminished lung sounds at physical examination. Patient non-communicative.  CHEST - 2 VIEW 03/17/2011:  Comparison: None.  The patient's current examination was incorrectly matched with the prior examinations from a different patient; I will have the information technology department correct this.  Findings: Prior sternotomy.  Cardiac silhouette mildly enlarged. Thoracic aorta atherosclerotic.  Hilar and mediastinal contours otherwise unremarkable.  Lungs clear.  Pulmonary vascularity normal.  No pleural effusions.  Mild degenerative changes involving the thoracic spine.  IMPRESSION: Mild cardiomegaly.  No acute cardiopulmonary disease.  Original Report Authenticated By: Arnell Sieving, M.D.   Dg Pelvis 1-2 Views  03/17/2011  *RADIOLOGY REPORT*  Clinical Data: The patient is unresponsive.  PELVIS - 1-2 VIEW  Comparison: CT pelvis 09/19/2010.  Findings: Moderate stool is present in the rectum.  Degenerative changes are noted the SI joints and lower lumbar spine.  The hips appear to be intact.  No acute bone or soft tissue abnormality is present.  Vascular calcifications are evident.  IMPRESSION:  1.  Moderate stool within the rectum. 2.  No acute abnormality. 3.  Degenerative changes in the lower lumbar spine and SI joints. 4.  Atherosclerosis.  Original Report Authenticated By: Jamesetta Orleans. MATTERN, M.D.   Ct Head Wo Contrast  03/17/2011  *RADIOLOGY REPORT*  Clinical Data: Fall.   Frontal head laceration.  CT HEAD WITHOUT CONTRAST  Technique:  Contiguous axial images were obtained from the base of the skull through the vertex without contrast.  Comparison: Brain MRI 06/04/2005.  Findings: There is no evidence of acute abnormality including acute infarction, hemorrhage, mass lesion, mass effect, midline shift or abnormal extra-axial fluid collection.  No pneumocephalus or hydrocephalus.  Atrophy and chronic microvascular ischemic change noted.  IMPRESSION: No acute finding.  Original Report Authenticated By: Bernadene Bell. Maricela Curet, M.D.     1. Fall       MDM  5:13 PM D/W nursing facility. Per caregivers, he is not normally ambulatory at baseline but was apparently trying to get out of wheelchair this afternoon and he fell before caregivers could get to him. He did not suffer LOC. He is aphasic at baseline.  7:00 PM Pt's wound repaired with suture. Pt tolerated well. Had a slight desaturation which is likely attributable to poor placement of O2 monitor. He was placed on 2L [place] and is now satting 100%.  8:15 PM Pt reassessed. Pt is responsive and opens eyes when his name is called. Attempts to squeeze my fingers, which he would not do earlier.  8:37 PM Pt persistently bradycardic. Otherwise, exam unremarkable for focal neuro abnormality; testing nl. Discussed with Dr. Clarene Duke. Pt appears to be at his baseline mental status and has not changed over the course of his visit. Will plan to discharge back to his facility at [facility].  9:12 PM ED RN called report to the patient's facility. She was informed that the correct patient was sent; however, he was sent with the incorrect information. This patient is not Coye Dawood, MRN [id number]; please disregard this note. The correct patient registration information, PMH, and med list will be faxed. Will re-evaluate the patient's clinical picture once this information is received. ED and charge ED RN aware. Charge RN, Dois Davenport, will  call facility and discuss with their charge RN. Steps taken to remove charges from this pt's account.    Grant Fontana, [place] 03/17/11 2326

## 2011-03-17 NOTE — ED Notes (Signed)
Per EMS, pt from [facility], staff called 911 after pt had witnessed fall, no loss of consciousness but hit head on floor.

## 2011-03-17 NOTE — ED Notes (Signed)
ZOX:[id number]<AV> Expected date:03/17/11<BR> Expected time: 2:18 PM<BR> Means of arrival:Ambulance<BR> Comments:<BR> EMS 90 GC, 70 yof fall w lac to eye

## 2011-03-17 NOTE — ED Notes (Signed)
Pt taken out of the system since name and person do not match, wrong pt sent from SNF

## 2011-03-18 ENCOUNTER — Other Ambulatory Visit: Payer: Self-pay | Admitting: Internal Medicine

## 2011-03-18 NOTE — ED Provider Notes (Signed)
Medical screening examination/treatment/procedure(s) were performed by non-physician practitioner and as supervising physician I was immediately available for consultation/collaboration.   Laray Anger, DO 03/18/11 [phone]

## 2011-05-25 IMAGING — CR DG CHEST 1V PORT
1 series · 1 of 1 positions shown · non-contrast
Comparison: Portable chest x-ray 07/13/2010 and dating back to
07/11/2010.

CLINICAL DATA: Chest pain.

PORTABLE CHEST - 1 VIEW [DATE]/[id number] hours:

[AP]
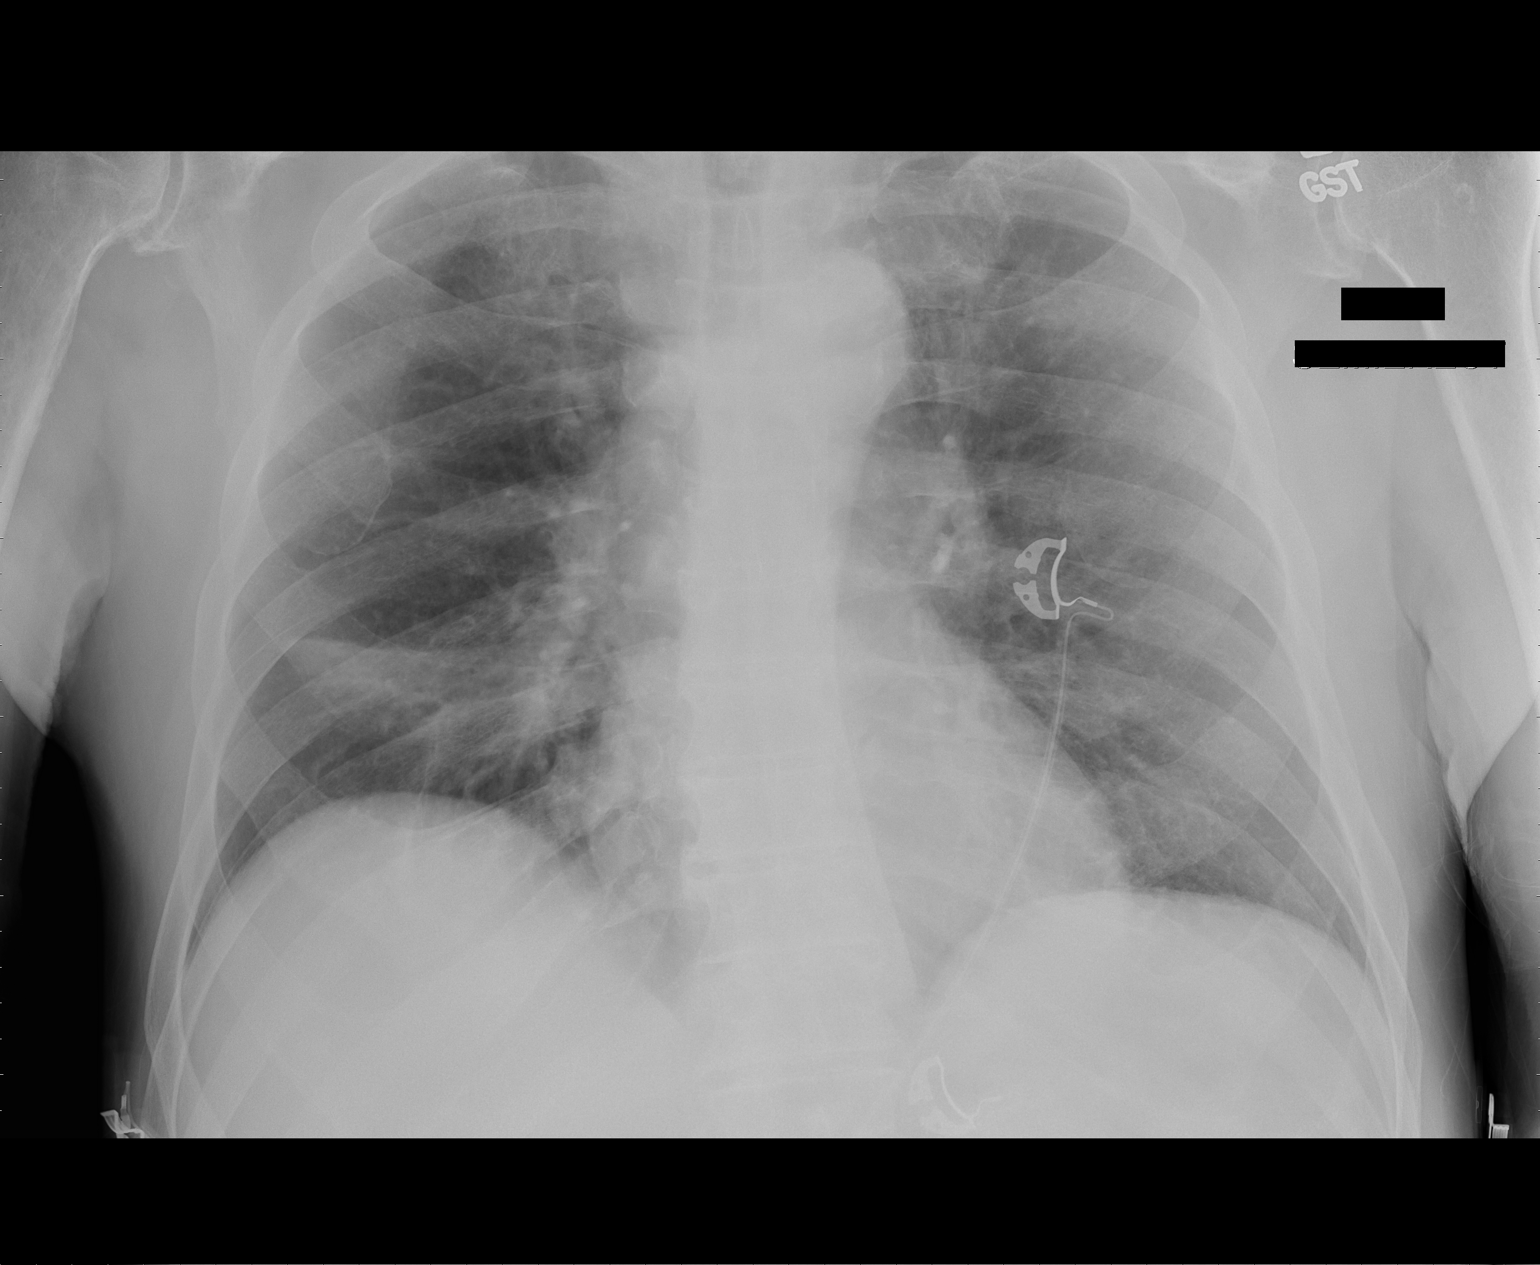

[1 of 1 positions shown; findings below may reference images not displayed]

FINDINGS: Interval development of airspace consolidation in the
right lung base, likely the right middle lobe.  Lungs remain clear
otherwise.  Cardiac silhouette normal in size, unchanged.  Interval
right jugular central venous catheter removal.
IMPRESSION: Pneumonia involving the right lung base, likely the right middle
lobe.

## 2011-08-04 ENCOUNTER — Encounter (HOSPITAL_COMMUNITY): Payer: Self-pay | Admitting: *Deleted

## 2011-08-04 ENCOUNTER — Emergency Department (HOSPITAL_COMMUNITY): Payer: Medicare Other

## 2011-08-04 ENCOUNTER — Inpatient Hospital Stay (HOSPITAL_COMMUNITY)
Admission: EM | Admit: 2011-08-04 | Discharge: 2011-08-12 | DRG: 872 | Disposition: A | Discharge status: Skilled Nursing Facility | Payer: Medicare Other | Attending: Internal Medicine | Admitting: Internal Medicine

## 2011-08-04 DIAGNOSIS — N179 Acute kidney failure, unspecified: Secondary | ICD-10-CM | POA: Diagnosis present

## 2011-08-04 DIAGNOSIS — D72829 Elevated white blood cell count, unspecified: Secondary | ICD-10-CM | POA: Diagnosis present

## 2011-08-04 DIAGNOSIS — F0390 Unspecified dementia without behavioral disturbance: Secondary | ICD-10-CM | POA: Diagnosis present

## 2011-08-04 DIAGNOSIS — N189 Chronic kidney disease, unspecified: Secondary | ICD-10-CM

## 2011-08-04 DIAGNOSIS — E119 Type 2 diabetes mellitus without complications: Secondary | ICD-10-CM | POA: Diagnosis present

## 2011-08-04 DIAGNOSIS — J441 Chronic obstructive pulmonary disease with (acute) exacerbation: Secondary | ICD-10-CM | POA: Diagnosis present

## 2011-08-04 DIAGNOSIS — N289 Disorder of kidney and ureter, unspecified: Secondary | ICD-10-CM

## 2011-08-04 DIAGNOSIS — N35919 Unspecified urethral stricture, male, unspecified site: Secondary | ICD-10-CM | POA: Diagnosis present

## 2011-08-04 DIAGNOSIS — K5641 Fecal impaction: Secondary | ICD-10-CM | POA: Diagnosis not present

## 2011-08-04 DIAGNOSIS — G8100 Flaccid hemiplegia affecting unspecified side: Secondary | ICD-10-CM | POA: Diagnosis present

## 2011-08-04 DIAGNOSIS — N390 Urinary tract infection, site not specified: Secondary | ICD-10-CM | POA: Diagnosis present

## 2011-08-04 DIAGNOSIS — I69920 Aphasia following unspecified cerebrovascular disease: Secondary | ICD-10-CM

## 2011-08-04 DIAGNOSIS — R339 Retention of urine, unspecified: Secondary | ICD-10-CM

## 2011-08-04 DIAGNOSIS — Y846 Urinary catheterization as the cause of abnormal reaction of the patient, or of later complication, without mention of misadventure at the time of the procedure: Secondary | ICD-10-CM | POA: Diagnosis present

## 2011-08-04 DIAGNOSIS — I69959 Hemiplegia and hemiparesis following unspecified cerebrovascular disease affecting unspecified side: Secondary | ICD-10-CM

## 2011-08-04 DIAGNOSIS — E1149 Type 2 diabetes mellitus with other diabetic neurological complication: Secondary | ICD-10-CM | POA: Diagnosis present

## 2011-08-04 DIAGNOSIS — N1330 Unspecified hydronephrosis: Secondary | ICD-10-CM | POA: Diagnosis present

## 2011-08-04 DIAGNOSIS — R509 Fever, unspecified: Secondary | ICD-10-CM | POA: Diagnosis present

## 2011-08-04 DIAGNOSIS — T8389XA Other specified complication of genitourinary prosthetic devices, implants and grafts, initial encounter: Secondary | ICD-10-CM | POA: Diagnosis present

## 2011-08-04 DIAGNOSIS — A419 Sepsis, unspecified organism: Principal | ICD-10-CM | POA: Diagnosis present

## 2011-08-04 HISTORY — DX: Chronic kidney disease, unspecified: N18.9

## 2011-08-04 LAB — URINALYSIS, ROUTINE W REFLEX MICROSCOPIC
Bilirubin Urine: NEGATIVE
Nitrite: POSITIVE — AB
Specific Gravity, Urine: 1.019 (ref 1.005–1.030)
Urobilinogen, UA: 0.2 mg/dL (ref 0.0–1.0)
pH: 6 (ref 5.0–8.0)

## 2011-08-04 LAB — DIFFERENTIAL
Eosinophils Absolute: 0 10*3/uL (ref 0.0–0.7)
Lymphocytes Relative: 3 % — ABNORMAL LOW (ref 12–46)
Lymphs Abs: 0.8 10*3/uL (ref 0.7–4.0)
Monocytes Relative: 8 % (ref 3–12)
Neutro Abs: 20.6 10*3/uL — ABNORMAL HIGH (ref 1.7–7.7)
Neutrophils Relative %: 89 % — ABNORMAL HIGH (ref 43–77)

## 2011-08-04 LAB — CBC
Hemoglobin: 9.8 g/dL — ABNORMAL LOW (ref 13.0–17.0)
MCH: 27.7 pg (ref 26.0–34.0)
Platelets: 135 10*3/uL — ABNORMAL LOW (ref 150–400)
RBC: 3.54 MIL/uL — ABNORMAL LOW (ref 4.22–5.81)
WBC: 23.3 10*3/uL — ABNORMAL HIGH (ref 4.0–10.5)

## 2011-08-04 LAB — GLUCOSE, CAPILLARY: Glucose-Capillary: 192 mg/dL — ABNORMAL HIGH (ref 70–99)

## 2011-08-04 LAB — COMPREHENSIVE METABOLIC PANEL
ALT: 10 U/L (ref 0–53)
Alkaline Phosphatase: 67 U/L (ref 39–117)
BUN: 47 mg/dL — ABNORMAL HIGH (ref 6–23)
CO2: 25 mEq/L (ref 19–32)
Chloride: 98 mEq/L (ref 96–112)
GFR calc Af Amer: 21 mL/min — ABNORMAL LOW (ref >90)
Glucose, Bld: 136 mg/dL — ABNORMAL HIGH (ref 70–99)
Potassium: 4.7 mEq/L (ref 3.5–5.1)
Sodium: 133 mEq/L — ABNORMAL LOW (ref 135–145)
Total Bilirubin: 0.3 mg/dL (ref 0.3–1.2)
Total Protein: 7 g/dL (ref 6.0–8.3)

## 2011-08-04 LAB — PROTIME-INR: Prothrombin Time: 14.3 seconds (ref 11.6–15.2)

## 2011-08-04 LAB — PROCALCITONIN: Procalcitonin: 84.78 ng/mL

## 2011-08-04 LAB — LACTIC ACID, PLASMA: Lactic Acid, Venous: 0.9 mmol/L (ref 0.5–2.2)

## 2011-08-04 MED ORDER — IPRATROPIUM BROMIDE 0.02 % IN SOLN
0.5000 mg | Freq: Once | RESPIRATORY_TRACT | Status: AC
  Administered 2011-08-04: 0.5 mg via RESPIRATORY_TRACT
  Filled 2011-08-04: qty 2.5

## 2011-08-04 MED ORDER — SODIUM CHLORIDE 0.9 % IV SOLN
1000.0000 mL | INTRAVENOUS | Status: DC
  Administered 2011-08-04 – 2011-08-07 (×4): 1000 mL via INTRAVENOUS

## 2011-08-04 MED ORDER — ZOLPIDEM TARTRATE 5 MG PO TABS: 5.0000 mg | ORAL_TABLET | Freq: Every evening | ORAL | Status: DC | PRN

## 2011-08-04 MED ORDER — ONDANSETRON HCL 4 MG PO TABS: 4.0000 mg | ORAL_TABLET | Freq: Four times a day (QID) | ORAL | Status: DC | PRN

## 2011-08-04 MED ORDER — ACETAMINOPHEN 325 MG PO TABS
650.0000 mg | ORAL_TABLET | Freq: Once | ORAL | Status: AC
  Administered 2011-08-04: 650 mg via ORAL
  Filled 2011-08-04: qty 2

## 2011-08-04 MED ORDER — HYDROMORPHONE HCL PF 1 MG/ML IJ SOLN
0.5000 mg | INTRAMUSCULAR | Status: DC | PRN
  Administered 2011-08-10: 1 mg via INTRAVENOUS
  Filled 2011-08-04: qty 1

## 2011-08-04 MED ORDER — ACETAMINOPHEN 650 MG RE SUPP
650.0000 mg | Freq: Four times a day (QID) | RECTAL | Status: DC | PRN
  Administered 2011-08-04 – 2011-08-05 (×2): 650 mg via RECTAL
  Filled 2011-08-04 (×2): qty 1

## 2011-08-04 MED ORDER — LIDOCAINE HCL 2 % EX GEL
CUTANEOUS | Status: AC
  Administered 2011-08-04: 1
  Filled 2011-08-04: qty 10

## 2011-08-04 MED ORDER — SODIUM CHLORIDE 0.9 % IV BOLUS (SEPSIS): 1000.0000 mL | Freq: Once | INTRAVENOUS | Status: DC

## 2011-08-04 MED ORDER — ALBUTEROL SULFATE (5 MG/ML) 0.5% IN NEBU
2.5000 mg | INHALATION_SOLUTION | RESPIRATORY_TRACT | Status: DC
  Administered 2011-08-04 – 2011-08-12 (×43): 2.5 mg via RESPIRATORY_TRACT
  Filled 2011-08-04 (×48): qty 0.5

## 2011-08-04 MED ORDER — SODIUM CHLORIDE 0.9 % IV SOLN
INTRAVENOUS | Status: DC
  Administered 2011-08-04 – 2011-08-06 (×2): via INTRAVENOUS

## 2011-08-04 MED ORDER — VANCOMYCIN HCL IN DEXTROSE 1-5 GM/200ML-% IV SOLN
1000.0000 mg | Freq: Once | INTRAVENOUS | Status: AC
  Administered 2011-08-04: 1000 mg via INTRAVENOUS
  Filled 2011-08-04: qty 200

## 2011-08-04 MED ORDER — ONDANSETRON HCL 4 MG/2ML IJ SOLN: 4.0000 mg | Freq: Three times a day (TID) | INTRAMUSCULAR | Status: AC | PRN

## 2011-08-04 MED ORDER — ALUM & MAG HYDROXIDE-SIMETH 200-200-20 MG/5ML PO SUSP: 30.0000 mL | Freq: Four times a day (QID) | ORAL | Status: DC | PRN

## 2011-08-04 MED ORDER — SODIUM CHLORIDE 0.9 % IV SOLN: INTRAVENOUS | Status: AC

## 2011-08-04 MED ORDER — SODIUM CHLORIDE 0.9 % IJ SOLN
3.0000 mL | Freq: Two times a day (BID) | INTRAMUSCULAR | Status: DC
  Administered 2011-08-04 – 2011-08-09 (×8): 3 mL via INTRAVENOUS

## 2011-08-04 MED ORDER — ALBUTEROL SULFATE (5 MG/ML) 0.5% IN NEBU
5.0000 mg | INHALATION_SOLUTION | Freq: Once | RESPIRATORY_TRACT | Status: AC
  Administered 2011-08-04: 5 mg via RESPIRATORY_TRACT
  Filled 2011-08-04: qty 1

## 2011-08-04 MED ORDER — VANCOMYCIN HCL 1000 MG IV SOLR
750.0000 mg | INTRAVENOUS | Status: DC
  Administered 2011-08-05 – 2011-08-07 (×3): 750 mg via INTRAVENOUS
  Filled 2011-08-04 (×3): qty 750

## 2011-08-04 MED ORDER — INSULIN ASPART 100 UNIT/ML ~~LOC~~ SOLN
0.0000 [IU] | SUBCUTANEOUS | Status: DC
  Administered 2011-08-04 – 2011-08-05 (×4): 2 [IU] via SUBCUTANEOUS
  Administered 2011-08-05: 1 [IU] via SUBCUTANEOUS
  Administered 2011-08-05: 2 [IU] via SUBCUTANEOUS
  Administered 2011-08-05 – 2011-08-06 (×3): 1 [IU] via SUBCUTANEOUS
  Administered 2011-08-06 (×2): 2 [IU] via SUBCUTANEOUS
  Administered 2011-08-07: 1 [IU] via SUBCUTANEOUS
  Administered 2011-08-09: 2 [IU] via SUBCUTANEOUS
  Filled 2011-08-04: qty 1

## 2011-08-04 MED ORDER — SODIUM CHLORIDE 0.9 % IV SOLN
1000.0000 mL | Freq: Once | INTRAVENOUS | Status: AC
  Administered 2011-08-04: 1000 mL via INTRAVENOUS

## 2011-08-04 MED ORDER — ACETAMINOPHEN 325 MG PO TABS
650.0000 mg | ORAL_TABLET | Freq: Four times a day (QID) | ORAL | Status: DC | PRN
  Administered 2011-08-06 – 2011-08-07 (×3): 650 mg via ORAL
  Filled 2011-08-04: qty 2
  Filled 2011-08-04: qty 1
  Filled 2011-08-04: qty 2

## 2011-08-04 MED ORDER — PIPERACILLIN-TAZOBACTAM 3.375 G IVPB
3.3750 g | Freq: Once | INTRAVENOUS | Status: AC
  Administered 2011-08-04: 3.375 g via INTRAVENOUS
  Filled 2011-08-04: qty 50

## 2011-08-04 MED ORDER — ONDANSETRON HCL 4 MG/2ML IJ SOLN: 4.0000 mg | Freq: Four times a day (QID) | INTRAMUSCULAR | Status: DC | PRN

## 2011-08-04 MED ORDER — PIPERACILLIN-TAZOBACTAM 3.375 G IVPB
3.3750 g | Freq: Three times a day (TID) | INTRAVENOUS | Status: DC
  Administered 2011-08-05 – 2011-08-06 (×4): 3.375 g via INTRAVENOUS
  Filled 2011-08-04 (×5): qty 50

## 2011-08-04 MED ORDER — IPRATROPIUM BROMIDE 0.02 % IN SOLN
0.5000 mg | RESPIRATORY_TRACT | Status: DC
  Administered 2011-08-04 – 2011-08-12 (×43): 0.5 mg via RESPIRATORY_TRACT
  Filled 2011-08-04 (×46): qty 2.5

## 2011-08-04 MED ORDER — OXYCODONE HCL 5 MG PO TABS
5.0000 mg | ORAL_TABLET | ORAL | Status: DC | PRN
  Administered 2011-08-09: 5 mg via ORAL
  Filled 2011-08-04 (×2): qty 1

## 2011-08-04 MED ORDER — ENOXAPARIN SODIUM 40 MG/0.4ML ~~LOC~~ SOLN
40.0000 mg | SUBCUTANEOUS | Status: DC
  Administered 2011-08-04: 40 mg via SUBCUTANEOUS
  Filled 2011-08-04 (×2): qty 0.4

## 2011-08-04 NOTE — Progress Notes (Signed)
ANTIBIOTIC CONSULT NOTE - INITIAL  Pharmacy Consult for Vancomycin & Zosyn Indication: Sepsis  No Known Allergies  Patient Measurements: Height: 5\' 10"  (177.8 cm) Weight: 176 lb 2.4 oz (79.9 kg) IBW/kg (Calculated) : 73    Vital Signs: Temp: 102.1 F (38.9 C) (04/27 2200) Temp src: Oral (04/27 2200) BP: 110/69 mmHg (04/27 1855) Pulse Rate: 102  (04/27 2034) Intake/Output from previous day:   Intake/Output from this shift:    Labs:  Basename 08/04/11 1500  WBC 23.3*  HGB 9.8*  PLT 135*  LABCREA --  CREATININE 3.09*   Estimated Creatinine Clearance: 22 ml/min (by C-G formula based on Cr of 3.09). No results found for this basename: VANCOTROUGH:2,VANCOPEAK:2,VANCORANDOM:2,GENTTROUGH:2,GENTPEAK:2,GENTRANDOM:2,TOBRATROUGH:2,TOBRAPEAK:2,TOBRARND:2,AMIKACINPEAK:2,AMIKACINTROU:2,AMIKACIN:2, in the last 72 hours   Microbiology: No results found for this or any previous visit (from the past 720 hour(s)).  Medical History: Past Medical History  Diagnosis Date  . Alterations of sensations, late effect of cerebrovascular disease   . Acute conjunctivitis, unspecified   . Aortic aneurysm of unspecified site without mention of rupture   . Flaccid hemiplegia affecting dominant side   . Type II or unspecified type diabetes mellitus without mention of complication, not stated as uncontrolled   . Depressive disorder, not elsewhere classified   . Unspecified essential hypertension   . Obstructive chronic bronchitis with exacerbation   . Anxiety state, unspecified   . Esophageal reflux   . Other and unspecified hyperlipidemia   . Pressure ulcer, other site   . Dementia     Medications:  Scheduled:    . sodium chloride  1,000 mL Intravenous Once   Followed by  . sodium chloride  1,000 mL Intravenous Once  . sodium chloride   Intravenous STAT  . acetaminophen  650 mg Oral Once  . ipratropium  0.5 mg Nebulization Q4H   And  . albuterol  2.5 mg Nebulization Q4H  . albuterol   5 mg Nebulization Once  . enoxaparin  40 mg Subcutaneous Q24H  . insulin aspart  0-9 Units Subcutaneous Q4H  . ipratropium  0.5 mg Nebulization Once  . lidocaine      . lidocaine      . piperacillin-tazobactam (ZOSYN)  IV  3.375 g Intravenous Once  . sodium chloride  1,000 mL Intravenous Once  . sodium chloride  3 mL Intravenous Q12H  . vancomycin  1,000 mg Intravenous Once   Infusions:    . sodium chloride 1,000 mL (08/04/11 1946)  . sodium chloride 75 mL/hr at 08/04/11 2152   Assessment: 71 year old male who presents with fever and shortness of breath and acute renal failure (Scr = 3.09).  Patient received in ED Vancomycin 1gm (@ 19:03) and Zosyn 3.375gm (@ 17:55).  IV Vancomycin and Zosyn to continue for sepsis.  Blood and urine cultures ordered.  Patient with urethral stricture; urology consulted to place foley.  CrCl (n) ~ 21 ml/min   Goal of Therapy:  Vancomycin trough level 15-20 mcg/ml  Plan:   Zosyn 3.375gm IV q8h (dose infused over 4 hours)  Vancomycin 750 mg IV q24h  Follow cultures & sensitivities  Watch renal function and adjust regimens should CrCl worsen  Check vancomycin trough level as needed.  Khloei Spiker, Joselyn Glassman, PharmD 08/04/2011,10:19 PM

## 2011-08-04 NOTE — Consult Note (Signed)
Urology Consult   Reason for referral: Inability to pass Foley catheter.  History of Present Illness: Justin Sherman is a 71 year old male patient of Dr. [name] who has a history of urethral stricture disease. He required dilatation of a urethral stricture with filiforms and followers and 4/12. He is seen in the emergency room this evening and is being admitted for sepsis. I was asked to place a Foley catheter as multiple attempts to place a catheter had been undertaken unsuccessfully. I had been told that his foreskin was narrowed to the point where the urethral meatus could not be visualized and when the meatus was located and a catheter was passed it would only go several centimeters before some form of obstruction was met.The patient is unable to give any meaningful history as he has an expressive aphasia. He does appear to have chronic incontinence as he is wearing a diaper.  Past Medical History  Diagnosis Date  . Alterations of sensations, late effect of cerebrovascular disease   . Acute conjunctivitis, unspecified   . Aortic aneurysm of unspecified site without mention of rupture   . Flaccid hemiplegia affecting dominant side   . Type II or unspecified type diabetes mellitus without mention of complication, not stated as uncontrolled   . Depressive disorder, not elsewhere classified   . Unspecified essential hypertension   . Obstructive chronic bronchitis with exacerbation   . Anxiety state, unspecified   . Esophageal reflux   . Other and unspecified hyperlipidemia   . Pressure ulcer, other site   . Dementia    History reviewed. No pertinent past surgical history.  Medications: Prior to Admission:  (Not in a hospital admission)  Allergies: No Known Allergies  History reviewed. No pertinent family history.  Social History:  does not have a smoking history on file. He does not have any smokeless tobacco history on file. He reports that he does not drink alcohol or use illicit  drugs.  Review of Systems: Pertinent items are noted in HPI. A comprehensive review of systems was unobtainable due to his expressive aphasia.  Physical Exam:  Vital signs in last 24 hours: Temp:  [99.5 F (37.5 C)-102.8 F (39.3 C)] 99.5 F (37.5 C) (04/27 1700) Pulse Rate:  [102] 102  (04/27 2034) Resp:  [20-27] 20  (04/27 2034) BP: (110-138)/(66-85) 110/69 mmHg (04/27 1855) SpO2:  [98 %-100 %] 98 % (04/27 2034) General appearance: alert and appears stated age Head: Normocephalic, without obvious abnormality, atraumatic Eyes: conjunctivae/corneas clear. EOM's intact.  Oropharynx: moist mucous membranes Neck: supple, symmetrical, trachea midline Resp: normal respiratory effort Cardio: regular rate and rhythm Back: symmetric, no curvature. ROM normal. No CVA tenderness. GI: soft, non-tender; bowel sounds normal; no masses,  no organomegaly Male genitalia: penis: He has a normal phallus with marked phimosis.Testes: bilaterally descended with no masses or tenderness. no hernias  Extremities: extremities normal, atraumatic, no cyanosis or edema Skin: Skin color normal. No visible rashes or lesions Neurologic: Grossly normal other than some apparent weakness of the right side.  Laboratory Data:   [facility] 08/04/11 1500  WBC 23.3*  HGB 9.8*  HCT 31.1*   BMET  Basename 08/04/11 1500  NA 133*  K 4.7  CL 98  CO2 25  GLUCOSE 136*  BUN 47*  CREATININE 3.09*  CALCIUM 9.6    Basename 08/04/11 1500  LABPT --  INR 1.09   Creatinine:  Basename 08/04/11 1500  CREATININE 3.09*    Imaging: Ct Head Wo Contrast  08/04/2011  *RADIOLOGY REPORT*  Clinical Data: Dementia.  Stroke.  Mental status changes.  CT HEAD WITHOUT CONTRAST  Technique:  Contiguous axial images were obtained from the base of the skull through the vertex without contrast.  Comparison: 07/11/2010  Findings: The brain shows generalized atrophy.  There is a old infarction in the left middle cerebral artery  territory which has progressed atrophy encephalomalacia, similar to the previous study. No sign of acute infarction, mass lesion, hemorrhage, hydrocephalus or extra-axial collection.  The calvarium is unremarkable.  There are mucosal inflammatory changes of the maxillary and ethmoid sinuses.  IMPRESSION: No acute intracranial finding.  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  Some mucosal inflammation of the paranasal sinuses.  Original Report Authenticated By: Thomasenia Sales, M.D.   Dg Chest Portable 1 View  08/04/2011  *RADIOLOGY REPORT*  Clinical Data: Shortness of breath.  Diabetes.  Chronic bronchitis.  PORTABLE CHEST - 1 VIEW  Comparison: 09/25/2010.  Findings: Normal sized heart.  Clear lungs.  Mild osteopenia and bilateral shoulder degenerative changes.  Thoracic spine degenerative changes and mild scoliosis.  IMPRESSION: No acute abnormality.  Original Report Authenticated By: Darrol Angel, M.D.   Dg Abd 2 Views  08/04/2011  *RADIOLOGY REPORT*  Clinical Data: Abdominal pain.  ABDOMEN - 2 VIEW  Comparison: Portable chest obtained earlier today.  Findings: Mildly dilated small bowel loops in the right mid to upper abdomen.  Mildly prominent colon filled with gas and stool. Prominent stool in the rectum.  No free peritoneal air.  Lumbar and lower thoracic spine degenerative changes.  Faintly visualized left renal calculi, better seen on the CT dated 09/08/2010.  Previously demonstrated right renal calculi are obscured by the bowel.  Right inferior pelvic phlebolith.  IMPRESSION:  1.  Mild small bowel ileus or partial obstruction. 2.  Mildly prominent stool in the rectum. 3.  Left renal calculi.  Original Report Authenticated By: Darrol Angel, M.D.   Procedure:I first prepped the penis with Betadine and then passed the 17 french flexible cystoscope through his foreskin and under direct vision into the urethral meatus and down the urethra where a pinpoint penile urethral stricture was identified. I passed  a 0.038 inch floppy tip guidewire through the strictured region and into the bladder. I then passed a dilating balloon over the guidewire and gently dilated the strictured region however when I removed the balloon I realized it was a ureteral dilating balloon and not a nephrostomy dilating balloon. This was therefore inadequate I dilating the stricture. I confirmed sing the cystoscope down the urethra and noted that I could not advance the scope beyond the strictured region. I therefore used Hayman dilators and passed sequential dilators starting at 63 [place] and progressing to 23 [place] over the guidewire. I then was able to pass a 16 [place] Foley catheter into the bladder with return of clear urine. His urine will be sent for culture and sensitivity. The Foley cathter was connected to closed system drainage. The patient tolerated the procedure well.  Impression/Assessment:  1. Phimosis. This is of no clinical significance. 2. Mid penile urethral stricture. This has been adequately dilated at this time. The catheter can be left in place for as long as monitoring of urine output is necessary and then can be removed.  Plan:  1. His catheterized urine will be sent for culture and sensitivity. 2. He will followup with Dr. [name] as needed.  G[place]ett Farm 08/04/2011, 8:36 PM

## 2011-08-04 NOTE — ED Notes (Signed)
Pt points to abd as a source of pain when asked. Cannot describe or elaborate. Hx of dementia and stroke. Pt moans in response to painful stimuli such as moving his R side, s/p CVA.

## 2011-08-04 NOTE — ED Notes (Signed)
Have attempted to in and out cath pt x3 per [place], [place], [place], RN and [place], [place]. No success, met resistance. Will scan bladder to check residual.

## 2011-08-04 NOTE — ED Notes (Signed)
Pt transported to CT ?

## 2011-08-04 NOTE — ED Notes (Signed)
ZOX:[id number]<AV> Expected date:<BR> Expected time: 1:14 PM<BR> Means of arrival:Ambulance<BR> Comments:<BR> M100 -- Respiratory Distress

## 2011-08-04 NOTE — ED Provider Notes (Signed)
History     CSN: [id number]  Arrival date & time 08/04/11  1318   First MD Initiated Contact with Patient 08/04/11 1454      Chief Complaint  Patient presents with  . Shortness of Breath  . Fever  . Cough    (Consider location/radiation/quality/duration/timing/severity/associated sxs/prior treatment) HPI Patient is a 71 year old nursing home resident with history of stroke as well as dementia who presents today with cough and fever as well as associated shortness of breath since this morning. Patient normally is on home O2 at 2 L. Patient's initial temperature was 102 rectally. At baseline patient occasionally answers yes and no but otherwise mumbles. He is at his reported neurologic baseline. Patient has baseline right-sided upper enlarged have any weakness as well. Patient does appear to have diffuse abdominal tenderness. He is a poor strain given his neurologic deficits. There are no other associated or modifying factors. Past Medical History  Diagnosis Date  . Alterations of sensations, late effect of cerebrovascular disease   . Acute conjunctivitis, unspecified   . Aortic aneurysm of unspecified site without mention of rupture   . Flaccid hemiplegia affecting dominant side   . Type II or unspecified type diabetes mellitus without mention of complication, not stated as uncontrolled   . Depressive disorder, not elsewhere classified   . Unspecified essential hypertension   . Obstructive chronic bronchitis with exacerbation   . Anxiety state, unspecified   . Esophageal reflux   . Other and unspecified hyperlipidemia   . Pressure ulcer, other site   . Dementia     History reviewed. No pertinent past surgical history.  History reviewed. No pertinent family history.  History  Substance Use Topics  . Smoking status: Not on file  . Smokeless tobacco: Not on file  . Alcohol Use: No      Review of Systems  Unable to perform ROS: Dementia    Allergies  Review of  patient's allergies indicates no known allergies.  Home Medications   Current Outpatient Rx  Name Route Sig Dispense Refill  . ACETAMINOPHEN 325 MG PO TABS Oral Take 650 mg by mouth every 4 (four) hours as needed. For pain     . ALBUTEROL SULFATE (5 MG/ML) 0.5% IN NEBU Nebulization Take 2.5 mg by nebulization every 2 (two) hours as needed. For shortness of breath or wheezing.    Marland Kitchen PROTEINEX PO LIQD Oral Take 30 mLs by mouth every morning.    . ASPIRIN 81 MG PO CHEW Oral Chew 81 mg by mouth every morning.    Marland Kitchen CITALOPRAM HYDROBROMIDE 20 MG PO TABS Oral Take 20 mg by mouth at bedtime.      Marland Kitchen FERROUS SULFATE 325 (65 FE) MG PO TABS Oral Take 325 mg by mouth daily with breakfast.      . FLUTICASONE-SALMETEROL 250-50 MCG/DOSE IN AEPB Inhalation Inhale 1 puff into the lungs 2 (two) times daily.     . GUAIFENESIN ER 600 MG PO TB12 Oral Take 600 mg by mouth 2 (two) times daily.      . INSULIN ASPART 100 UNIT/ML [place] SOLN Subcutaneous Inject 5 Units into the skin 4 (four) times daily -  before meals and at bedtime. Give for CBG > 150.    Marland Kitchen INSULIN GLARGINE 100 UNIT/ML [place] SOLN Subcutaneous Inject 5 Units into the skin at bedtime.      . IPRATROPIUM-ALBUTEROL 0.5-2.5 (3) MG/3ML IN SOLN Nebulization Take 3 mLs by nebulization every 6 (six) hours as needed. For shortness of  breath     . LORAZEPAM 0.5 MG PO TABS Oral Take 0.5 mg by mouth daily as needed. For panic attacks or anxiety.    Carma Leaven M PLUS PO TABS Oral Take 1 tablet by mouth every morning.     . OXYCODONE HCL 5 MG PO TABS Oral Take 10 mg by mouth every 4 (four) hours as needed. For pain     . PROMETHAZINE HCL 25 MG PO TABS Oral Take 25 mg by mouth every 6 (six) hours as needed. For nausea     . RANITIDINE HCL 75 MG PO TABS Oral Take 75 mg by mouth at bedtime.    Marland Kitchen SIMVASTATIN 40 MG PO TABS Oral Take 40 mg by mouth at bedtime.      . THIAMINE HCL 100 MG PO TABS Oral Take 100 mg by mouth every morning.       BP 125/66  Temp(Src) 102.5 F (39.2  C) (Rectal)  Resp 25  SpO2 100%  Physical Exam  Nursing note and vitals reviewed. Constitutional: He appears well-developed and well-nourished. No distress.       Chronically ill-appearing  HENT:  Head: Normocephalic and atraumatic.  Eyes: Conjunctivae and EOM are normal. Pupils are equal, round, and reactive to light.  Neck: Normal range of motion.  Cardiovascular: Regular rhythm and normal heart sounds.  Exam reveals no gallop and no friction rub.   No murmur heard.      Tachycardia  Pulmonary/Chest: No respiratory distress. He has wheezes. He has no rales.       Now satting 97% on 4 L of O2 per nasal cannula. Patient is mildly tachypneic  Abdominal: Soft. Bowel sounds are normal.  Genitourinary: Guaiac negative stool.       Patient is uncircumcised male. Rectal shows no stool in the vault. No gross blood on the glove .  Musculoskeletal: Normal range of motion. He exhibits no edema and no tenderness.  Neurological: He is alert.       Patient is at his reported neurologic baseline with right-sided upper enlarged have any deficits. Patient will occasionally answer yes or no but mainly mumbles unintelligibly. He is alert and cooperative with our interventions.  Skin: Skin is warm and dry. No rash noted.  Psychiatric: He has a normal mood and affect.    ED Course  Procedures (including critical care time)  CRITICAL CARE Performed by: Cyndra Numbers   Total critical care time: 45 minutes  Critical care time was exclusive of separately billable procedures and treating other patients.  Critical care was necessary to treat or prevent imminent or life-threatening deterioration.  Critical care was time spent personally by me on the following activities: development of treatment plan with patient and/or surrogate as well as nursing, discussions with consultants, evaluation of patient's response to treatment, examination of patient, obtaining history from patient or surrogate, ordering  and performing treatments and interventions, ordering and review of laboratory studies, ordering and review of radiographic studies, pulse oximetry and re-evaluation of patient's condition.   Date: 08/04/2011  Rate: 108  Rhythm: sinus tachycardia  QRS Axis: left  Intervals: normal  ST/T Wave abnormalities: nonspecific T wave changes  Conduction Disutrbances:left anterior fascicular block  Narrative Interpretation: New T-wave flattening and inversions in lead 1 and aVL. No significant other changes noted.  Old EKG Reviewed: changes noted   Labs Reviewed  URINALYSIS, ROUTINE W REFLEX MICROSCOPIC - Abnormal; Notable for the following:    APPearance TURBID (*)    Hgb  urine dipstick LARGE (*)    Protein, ur 30 (*)    Nitrite POSITIVE (*)    Leukocytes, UA LARGE (*)    All other components within normal limits  CBC - Abnormal; Notable for the following:    WBC 23.3 (*)    RBC 3.54 (*)    Hemoglobin 9.8 (*)    HCT 31.1 (*)    Platelets 135 (*)    All other components within normal limits  DIFFERENTIAL - Abnormal; Notable for the following:    Neutrophils Relative 89 (*)    Neutro Abs 20.6 (*)    Lymphocytes Relative 3 (*)    Monocytes Absolute 1.8 (*)    All other components within normal limits  COMPREHENSIVE METABOLIC PANEL - Abnormal; Notable for the following:    Sodium 133 (*)    Glucose, Bld 136 (*)    BUN 47 (*)    Creatinine, Ser 3.09 (*)    Albumin 3.3 (*)    GFR calc non Af Amer 19 (*)    GFR calc Af Amer 21 (*)    All other components within normal limits  GLUCOSE, CAPILLARY - Abnormal; Notable for the following:    Glucose-Capillary 192 (*)    All other components within normal limits  URINE MICROSCOPIC-ADD ON - Abnormal; Notable for the following:    Bacteria, UA MANY (*)    Casts GRANULAR CAST (*)    All other components within normal limits  PROTIME-INR  LIPASE, BLOOD  LACTIC ACID, PLASMA  PROCALCITONIN  POCT I-STAT TROPONIN I  CULTURE, BLOOD (ROUTINE X  2)  CULTURE, BLOOD (ROUTINE X 2)  URINE CULTURE  BASIC METABOLIC PANEL  CBC  HEMOGLOBIN A1C   Dg Chest Portable 1 View  08/04/2011  *RADIOLOGY REPORT*  Clinical Data: Shortness of breath.  Diabetes.  Chronic bronchitis.  PORTABLE CHEST - 1 VIEW  Comparison: 09/25/2010.  Findings: Normal sized heart.  Clear lungs.  Mild osteopenia and bilateral shoulder degenerative changes.  Thoracic spine degenerative changes and mild scoliosis.  IMPRESSION: No acute abnormality.  Original Report Authenticated By: Darrol Angel, M.D.     1. Obstructive chronic bronchitis with exacerbation   2. Type II or unspecified type diabetes mellitus without mention of complication, not stated as uncontrolled   3. Dementia   4. Acute renal insufficiency   5. UTI (urinary tract infection)   6. Severe sepsis       MDM  Patient is a nursing home resident who presents with fever, tachycardia and tachypnea. Initial chest x-ray showed no evidence of infiltrate. Obtaining urine was technically difficult. Bladder scan was performed and showed 220 cc urine.  Patient did have some abdominal tenderness. Rectal exam showed no gross blood and no fecal impaction. Acute abdominal series was concerning for possible partial ileus or small bowel obstruction. Vanc and Zosyn were ordered given patient's probable sepsis. Patient had a white count of 23. His lactate was not elevated but his pro calcitonin was significantly elevated. Dr. Vernie Ammons from urology had to come to the emergency department to place a Foley catheter. Even with his expertise this was difficult. Tylenol and IV fluids administered. Blood cultures were collected prior to antibiotic administration.    Patient did have evidence of urinary tract infection. He received 3 L of normal saline IV bolus. His renal panel showed acute renal insufficiency as well with an increase in his creatinine from 1.4 to 3. Patient did not have an anion gap. Patient's hemodynamics improved and  he  was weaned down to his normal home O2 requirement of 2 L nasal cannula. He did receive 1 breathing treatment without be drawn Atrovent with this his breathing improved. Patient was not given steroids here. Patient was admitted to step down with hospitalist for further management.        Cyndra Numbers, MD 08/04/11 2118

## 2011-08-04 NOTE — ED Notes (Signed)
Justin Spry, NP and Alexia Freestone, RN attempted in and out cath without success. Hunt, EDP notified.

## 2011-08-04 NOTE — ED Notes (Signed)
Bladder scan showed in bladder. Hunt, EDP made aware.

## 2011-08-04 NOTE — H&P (Addendum)
DATE OF ADMISSION:  08/04/2011  PCP:    Kimber Relic, MD, MD   Chief Complaint:  Fever SOB, Urinary retention   HPI: Justin Sherman is an 71 y.o. male with multiple medical problems including COPD on continuous O2 from [facility] who was sent to ED with fever and SOB.  In the ED he was found to have a temperature of 102.  He was found to have urinary retention in the ED, and Urology had to be called to place a foley catheter due to increased difficulty passing the foley.  He has expressive aphasia and right sided hemiplegia as residual deficits from a CVA, however he is able to nod and speak sparse words in response to questions.    Past Medical History  Diagnosis Date  . Alterations of sensations, late effect of cerebrovascular disease   . Acute conjunctivitis, unspecified   . Aortic aneurysm of unspecified site without mention of rupture   . Flaccid hemiplegia affecting dominant side   . Type II or unspecified type diabetes mellitus without mention of complication, not stated as uncontrolled   . Depressive disorder, not elsewhere classified   . Unspecified essential hypertension   . Obstructive chronic bronchitis with exacerbation   . Anxiety state, unspecified   . Esophageal reflux   . Other and unspecified hyperlipidemia   . Pressure ulcer, other site   . Dementia     History reviewed. No pertinent past surgical history.  Medications:  HOME MEDS: Prior to Admission medications   Medication Sig Start Date End Date Taking? Authorizing Provider  acetaminophen (TYLENOL) 325 MG tablet Take 650 mg by mouth every 4 (four) hours as needed. For pain    Yes Historical Provider, MD  albuterol (PROVENTIL) (5 MG/ML) 0.5% nebulizer solution Take 2.5 mg by nebulization every 2 (two) hours as needed. For shortness of breath or wheezing.   Yes Historical Provider, MD  Amino Acids-Protein Hydrolys (PROTEINEX) LIQD Take 30 mLs by mouth every morning.   Yes Historical Provider, MD    aspirin 81 MG chewable tablet Chew 81 mg by mouth every morning.   Yes Historical Provider, MD  citalopram (CELEXA) 20 MG tablet Take 20 mg by mouth at bedtime.     Yes Historical Provider, MD  ferrous sulfate 325 (65 FE) MG tablet Take 325 mg by mouth daily with breakfast.     Yes Historical Provider, MD  Fluticasone-Salmeterol (ADVAIR) 250-50 MCG/DOSE AEPB Inhale 1 puff into the lungs 2 (two) times daily.    Yes Historical Provider, MD  guaiFENesin (MUCINEX) 600 MG 12 hr tablet Take 600 mg by mouth 2 (two) times daily.     Yes Historical Provider, MD  insulin aspart (NOVOLOG) 100 UNIT/ML injection Inject 5 Units into the skin 4 (four) times daily -  before meals and at bedtime. Give for CBG > 150.   Yes Historical Provider, MD  insulin glargine (LANTUS) 100 UNIT/ML injection Inject 5 Units into the skin at bedtime.     Yes Historical Provider, MD  ipratropium-albuterol (DUONEB) 0.5-2.5 (3) MG/3ML SOLN Take 3 mLs by nebulization every 6 (six) hours as needed. For shortness of breath    Yes Historical Provider, MD  LORazepam (ATIVAN) 0.5 MG tablet Take 0.5 mg by mouth daily as needed. For panic attacks or anxiety.   Yes Historical Provider, MD  Multiple Vitamins-Minerals (MULTIVITAMINS THER. W/MINERALS) TABS Take 1 tablet by mouth every morning.    Yes Historical Provider, MD  oxyCODONE (OXY IR/ROXICODONE) 5  MG immediate release tablet Take 10 mg by mouth every 4 (four) hours as needed. For pain    Yes Historical Provider, MD  promethazine (PHENERGAN) 25 MG tablet Take 25 mg by mouth every 6 (six) hours as needed. For nausea    Yes Historical Provider, MD  ranitidine (ZANTAC) 75 MG tablet Take 75 mg by mouth at bedtime.   Yes Historical Provider, MD  simvastatin (ZOCOR) 40 MG tablet Take 40 mg by mouth at bedtime.     Yes Historical Provider, MD  thiamine 100 MG tablet Take 100 mg by mouth every morning.    Yes Historical Provider, MD    Allergies:  No Known Allergies  Social History:   does  not have a smoking history on file. He does not have any smokeless tobacco history on file. He reports that he does not drink alcohol or use illicit drugs.  Family History: History reviewed. No pertinent family history.  Review of Systems:  fever, SOB, muscle weakness.   The patient denies anorexia, weight loss, vision loss, decreased hearing, hoarseness, chest pain, syncope, dyspnea on exertion, peripheral edema, balance deficits, hemoptysis, abdominal pain, melena, hematochezia, severe indigestion/heartburn, hematuria, incontinence, genital sores,  suspicious skin lesions, transient blindness, difficulty walking, depression, unusual weight change, abnormal bleeding, enlarged lymph nodes, angioedema, and breast masses.   Physical Exam:  GEN:  Pleasant Elderly debilitated 71 year old Caucasian male examined  and in no acute distress; cooperative with exam Filed Vitals:   08/04/11 1645 08/04/11 1651 08/04/11 1700 08/04/11 1855  BP:  138/85  110/69  Temp:   99.5 F (37.5 C)   TempSrc:   Oral   Resp:  27  21  SpO2: 100% 100%     Blood pressure 110/69, temperature 99.5 F (37.5 C), temperature source Oral, resp. rate 21, SpO2 100.00%. PSYCH: He is alert and oriented x2; does appear anxious  HEENT: Normocephalic and Atraumatic, Mucous membranes pink; PERRLA; EOM intact; Fundi:  Benign;  No scleral icterus, Nares: Patent, Oropharynx: Clear, Edentulous,   Neck:  FROM, no cervical lymphadenopathy nor thyromegaly or carotid bruit; no JVD; Breasts:: Not examined CHEST WALL: No tenderness CHEST: Normal respiration, clear to auscultation bilaterally HEART: Regular rate and rhythm; no murmurs rubs or gallops BACK: No kyphosis or scoliosis; no CVA tenderness ABDOMEN: Positive Bowel Sounds, Distended, tympanic in all 4 quadrants, Obese, soft non-tender; no masses, no organomegaly. Rectal Exam: Not done EXTREMITIES: No bone or joint deformity; age-appropriate arthropathy of the hands and knees; no  cyanosis, clubbing or edema; no ulcerations. Genitalia: not examined PULSES: 2+ and symmetric SKIN: Normal hydration no rash or ulceration CNS: Chronic Expressive Aphasia, and Right Hemiplegia and Hemiparesis.     Labs & Imaging Results for orders placed during the hospital encounter of 08/04/11 (from the past 48 hour(s))  CBC     Status: Abnormal   Collection Time   08/04/11  3:00 PM      Component Value Range Comment   WBC 23.3 (*) 4.0 - 10.5 (K/uL)    RBC 3.54 (*) 4.22 - 5.81 (MIL/uL)    Hemoglobin 9.8 (*) 13.0 - 17.0 (g/dL)    HCT 91.4 (*) 78.2 - 52.0 (%)    MCV 87.9  78.0 - 100.0 (fL)    MCH 27.7  26.0 - 34.0 (pg)    MCHC 31.5  30.0 - 36.0 (g/dL)    RDW 95.6  21.3 - 08.6 (%)    Platelets 135 (*) 150 - 400 (K/uL)   DIFFERENTIAL  Status: Abnormal   Collection Time   08/04/11  3:00 PM      Component Value Range Comment   Neutrophils Relative 89 (*) 43 - 77 (%)    Neutro Abs 20.6 (*) 1.7 - 7.7 (K/uL)    Lymphocytes Relative 3 (*) 12 - 46 (%)    Lymphs Abs 0.8  0.7 - 4.0 (K/uL)    Monocytes Relative 8  3 - 12 (%)    Monocytes Absolute 1.8 (*) 0.1 - 1.0 (K/uL)    Eosinophils Relative 0  0 - 5 (%)    Eosinophils Absolute 0.0  0.0 - 0.7 (K/uL)    Basophils Relative 0  0 - 1 (%)    Basophils Absolute 0.0  0.0 - 0.1 (K/uL)   COMPREHENSIVE METABOLIC PANEL     Status: Abnormal   Collection Time   08/04/11  3:00 PM      Component Value Range Comment   Sodium 133 (*) 135 - 145 (mEq/L)    Potassium 4.7  3.5 - 5.1 (mEq/L)    Chloride 98  96 - 112 (mEq/L)    CO2 25  19 - 32 (mEq/L)    Glucose, Bld 136 (*) 70 - 99 (mg/dL)    BUN 47 (*) 6 - 23 (mg/dL)    Creatinine, Ser 1.61 (*) 0.50 - 1.35 (mg/dL)    Calcium 9.6  8.4 - 10.5 (mg/dL)    Total Protein 7.0  6.0 - 8.3 (g/dL)    Albumin 3.3 (*) 3.5 - 5.2 (g/dL)    AST 18  0 - 37 (U/L)    ALT 10  0 - 53 (U/L)    Alkaline Phosphatase 67  39 - 117 (U/L)    Total Bilirubin 0.3  0.3 - 1.2 (mg/dL)    GFR calc non Af Amer 19 (*) >90  (mL/min)    GFR calc Af Amer 21 (*) >90 (mL/min)   PROTIME-INR     Status: Normal   Collection Time   08/04/11  3:00 PM      Component Value Range Comment   Prothrombin Time 14.3  11.6 - 15.2 (seconds)    INR 1.09  0.00 - 1.49    LIPASE, BLOOD     Status: Normal   Collection Time   08/04/11  3:00 PM      Component Value Range Comment   Lipase 32  11 - 59 (U/L)   LACTIC ACID, PLASMA     Status: Normal   Collection Time   08/04/11  3:00 PM      Component Value Range Comment   Lactic Acid, Venous 0.9  0.5 - 2.2 (mmol/L)   PROCALCITONIN     Status: Normal   Collection Time   08/04/11  3:00 PM      Component Value Range Comment   Procalcitonin 84.78     POCT I-STAT TROPONIN I     Status: Normal   Collection Time   08/04/11  5:56 PM      Component Value Range Comment   Troponin i, poc 0.02  0.00 - 0.08 (ng/mL)    Comment 3             Ct Head Wo Contrast  08/04/2011  *RADIOLOGY REPORT*  Clinical Data: Dementia.  Stroke.  Mental status changes.  CT HEAD WITHOUT CONTRAST  Technique:  Contiguous axial images were obtained from the base of the skull through the vertex without contrast.  Comparison: 07/11/2010  Findings: The brain shows generalized  atrophy.  There is a old infarction in the left middle cerebral artery territory which has progressed atrophy encephalomalacia, similar to the previous study. No sign of acute infarction, mass lesion, hemorrhage, hydrocephalus or extra-axial collection.  The calvarium is unremarkable.  There are mucosal inflammatory changes of the maxillary and ethmoid sinuses.  IMPRESSION: No acute intracranial finding.  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  Some mucosal inflammation of the paranasal sinuses.  Original Report Authenticated By: Thomasenia Sales, M.D.   Dg Chest Portable 1 View  08/04/2011  *RADIOLOGY REPORT*  Clinical Data: Shortness of breath.  Diabetes.  Chronic bronchitis.  PORTABLE CHEST - 1 VIEW  Comparison: 09/25/2010.  Findings: Normal sized heart.   Clear lungs.  Mild osteopenia and bilateral shoulder degenerative changes.  Thoracic spine degenerative changes and mild scoliosis.  IMPRESSION: No acute abnormality.  Original Report Authenticated By: Darrol Angel, M.D.   Dg Abd 2 Views  08/04/2011  *RADIOLOGY REPORT*  Clinical Data: Abdominal pain.  ABDOMEN - 2 VIEW  Comparison: Portable chest obtained earlier today.  Findings: Mildly dilated small bowel loops in the right mid to upper abdomen.  Mildly prominent colon filled with gas and stool. Prominent stool in the rectum.  No free peritoneal air.  Lumbar and lower thoracic spine degenerative changes.  Faintly visualized left renal calculi, better seen on the CT dated 09/08/2010.  Previously demonstrated right renal calculi are obscured by the bowel.  Right inferior pelvic phlebolith.  IMPRESSION:  1.  Mild small bowel ileus or partial obstruction. 2.  Mildly prominent stool in the rectum. 3.  Left renal calculi.  Original Report Authenticated By: Darrol Angel, M.D.      Assessment: Present on Admission:  .Sepsis .ARF (acute renal failure) .Urinary retention .Fever .Leukocytosis .Obstructive chronic bronchitis with exacerbation .Type II or unspecified type diabetes mellitus without mention of complication, not stated as uncontrolled .Dementia .Flaccid hemiplegia affecting dominant side    Plan:   Admit to Stepdown Unit Blood Cultures and Urine cultures done IV Vanc and Zosyn Foley placed by Urology due to Difficulty Anti-pyretics PRN Monitor WBCs DuoNebs. O2, and Continuous Pulse Oximetry SSI Coverage Reconcile Regular Meds DVT prophylaxis Other plans as per orders.  Verify Code Status   CODE STATUS:      FULL CODE      Critical care time: 60 minutes.   Lizbeth Feijoo C 08/04/2011, 8:29 PM

## 2011-08-04 NOTE — ED Notes (Signed)
Pt in from [facility] by ems. Staff noticed fever, cough, sob since this am. Pt normally on 2L O2.

## 2011-08-05 LAB — BASIC METABOLIC PANEL
CO2: 24 mEq/L (ref 19–32)
Calcium: 8.8 mg/dL (ref 8.4–10.5)
GFR calc non Af Amer: 19 mL/min — ABNORMAL LOW (ref >90)
Sodium: 134 mEq/L — ABNORMAL LOW (ref 135–145)

## 2011-08-05 LAB — CBC
MCV: 88.8 fL (ref 78.0–100.0)
Platelets: 120 10*3/uL — ABNORMAL LOW (ref 150–400)
RBC: 3.04 MIL/uL — ABNORMAL LOW (ref 4.22–5.81)
WBC: 17.4 10*3/uL — ABNORMAL HIGH (ref 4.0–10.5)

## 2011-08-05 LAB — URINE CULTURE
Colony Count: 75000
Culture  Setup Time: 201304280245
Special Requests: NORMAL

## 2011-08-05 LAB — GLUCOSE, CAPILLARY
Glucose-Capillary: 127 mg/dL — ABNORMAL HIGH (ref 70–99)
Glucose-Capillary: 151 mg/dL — ABNORMAL HIGH (ref 70–99)
Glucose-Capillary: 153 mg/dL — ABNORMAL HIGH (ref 70–99)
Glucose-Capillary: 161 mg/dL — ABNORMAL HIGH (ref 70–99)
Glucose-Capillary: 166 mg/dL — ABNORMAL HIGH (ref 70–99)

## 2011-08-05 LAB — HEMOGLOBIN A1C: Hgb A1c MFr Bld: 6 % — ABNORMAL HIGH (ref <5.7)

## 2011-08-05 MED ORDER — FAMOTIDINE IN NACL 20-0.9 MG/50ML-% IV SOLN
20.0000 mg | Freq: Two times a day (BID) | INTRAVENOUS | Status: DC
  Administered 2011-08-05 – 2011-08-07 (×6): 20 mg via INTRAVENOUS
  Filled 2011-08-05 (×8): qty 50

## 2011-08-05 MED ORDER — ENOXAPARIN SODIUM 30 MG/0.3ML ~~LOC~~ SOLN
30.0000 mg | SUBCUTANEOUS | Status: DC
  Administered 2011-08-05 – 2011-08-09 (×5): 30 mg via SUBCUTANEOUS
  Filled 2011-08-05 (×7): qty 0.3

## 2011-08-05 NOTE — Progress Notes (Signed)
Subjective: Hemodynamically stable overnight UTI with sepsis Patient is alert denies any pain, no complaints  Objective: Vital signs in last 24 hours: Filed Vitals:   08/05/11 0404 08/05/11 0800 08/05/11 0842 08/05/11 1000  BP:  126/53  127/67  Pulse:  96  99  Temp:  99.4 F (37.4 C)    TempSrc:  Oral    Resp:  24  26  Height:      Weight:      SpO2: 98% 97% 99% 97%    Intake/Output Summary (Last 24 hours) at 08/05/11 1130 Last data filed at 08/05/11 1015  Gross per 24 hour  Intake  972.5 ml  Output    530 ml  Net  442.5 ml    Weight change:   PSYCH: He is alert and oriented x2; does appear anxious  HEENT: Normocephalic and Atraumatic, Mucous membranes pink; PERRLA; EOM intact; Fundi: Benign; No scleral icterus, Nares: Patent, Oropharynx: Clear, Edentulous, Neck: FROM, no cervical lymphadenopathy nor thyromegaly or carotid bruit; no JVD;  Breasts:: Not examined  CHEST WALL: No tenderness  CHEST: Normal respiration, clear to auscultation bilaterally  HEART: Regular rate and rhythm; no murmurs rubs or gallops  BACK: No kyphosis or scoliosis; no CVA tenderness  ABDOMEN: Positive Bowel Sounds, Distended, tympanic in all 4 quadrants, Obese, soft non-tender; no masses, no organomegaly.  Rectal Exam: Not done  EXTREMITIES: No bone or joint deformity; age-appropriate arthropathy of the hands and knees; no cyanosis, clubbing or edema; no ulcerations.  Genitalia: not examined  PULSES: 2+ and symmetric  SKIN: Normal hydration no rash or ulceration  CNS: Chronic Expressive Aphasia, and Right Hemiplegia and Hemiparesis.    Lab Results: Results for orders placed during the hospital encounter of 08/04/11 (from the past 24 hour(s))  CBC     Status: Abnormal   Collection Time   08/04/11  3:00 PM      Component Value Range   WBC 23.3 (*) 4.0 - 10.5 (K/uL)   RBC 3.54 (*) 4.22 - 5.81 (MIL/uL)   Hemoglobin 9.8 (*) 13.0 - 17.0 (g/dL)   HCT 62.1 (*) 30.8 - 52.0 (%)   MCV 87.9  78.0 -  100.0 (fL)   MCH 27.7  26.0 - 34.0 (pg)   MCHC 31.5  30.0 - 36.0 (g/dL)   RDW 65.7  84.6 - 96.2 (%)   Platelets 135 (*) 150 - 400 (K/uL)  DIFFERENTIAL     Status: Abnormal   Collection Time   08/04/11  3:00 PM      Component Value Range   Neutrophils Relative 89 (*) 43 - 77 (%)   Neutro Abs 20.6 (*) 1.7 - 7.7 (K/uL)   Lymphocytes Relative 3 (*) 12 - 46 (%)   Lymphs Abs 0.8  0.7 - 4.0 (K/uL)   Monocytes Relative 8  3 - 12 (%)   Monocytes Absolute 1.8 (*) 0.1 - 1.0 (K/uL)   Eosinophils Relative 0  0 - 5 (%)   Eosinophils Absolute 0.0  0.0 - 0.7 (K/uL)   Basophils Relative 0  0 - 1 (%)   Basophils Absolute 0.0  0.0 - 0.1 (K/uL)  COMPREHENSIVE METABOLIC PANEL     Status: Abnormal   Collection Time   08/04/11  3:00 PM      Component Value Range   Sodium 133 (*) 135 - 145 (mEq/L)   Potassium 4.7  3.5 - 5.1 (mEq/L)   Chloride 98  96 - 112 (mEq/L)   CO2 25  19 - 32 (  mEq/L)   Glucose, Bld 136 (*) 70 - 99 (mg/dL)   BUN 47 (*) 6 - 23 (mg/dL)   Creatinine, Ser 0.98 (*) 0.50 - 1.35 (mg/dL)   Calcium 9.6  8.4 - 11.9 (mg/dL)   Total Protein 7.0  6.0 - 8.3 (g/dL)   Albumin 3.3 (*) 3.5 - 5.2 (g/dL)   AST 18  0 - 37 (U/L)   ALT 10  0 - 53 (U/L)   Alkaline Phosphatase 67  39 - 117 (U/L)   Total Bilirubin 0.3  0.3 - 1.2 (mg/dL)   GFR calc non Af Amer 19 (*) >90 (mL/min)   GFR calc Af Amer 21 (*) >90 (mL/min)  PROTIME-INR     Status: Normal   Collection Time   08/04/11  3:00 PM      Component Value Range   Prothrombin Time 14.3  11.6 - 15.2 (seconds)   INR 1.09  0.00 - 1.49   LIPASE, BLOOD     Status: Normal   Collection Time   08/04/11  3:00 PM      Component Value Range   Lipase 32  11 - 59 (U/L)  CULTURE, BLOOD (ROUTINE X 2)     Status: Normal (Preliminary result)   Collection Time   08/04/11  3:00 PM      Component Value Range   Specimen Description BLOOD LEFT WRIST  5 ML IN [facility] BOTTLE     Special Requests Normal     Culture  Setup Time [id number]     Culture       Value:         BLOOD CULTURE RECEIVED NO GROWTH TO DATE CULTURE WILL BE HELD FOR 5 DAYS BEFORE ISSUING A FINAL NEGATIVE REPORT   Report Status PENDING    LACTIC ACID, PLASMA     Status: Normal   Collection Time   08/04/11  3:00 PM      Component Value Range   Lactic Acid, Venous 0.9  0.5 - 2.2 (mmol/L)  PROCALCITONIN     Status: Normal   Collection Time   08/04/11  3:00 PM      Component Value Range   Procalcitonin 84.78    HEMOGLOBIN A1C     Status: Abnormal   Collection Time   08/04/11  3:00 PM      Component Value Range   Hemoglobin A1C 6.0 (*) <5.7 (%)   Mean Plasma Glucose 126 (*) <117 (mg/dL)  CULTURE, BLOOD (ROUTINE X 2)     Status: Normal (Preliminary result)   Collection Time   08/04/11  3:55 PM      Component Value Range   Specimen Description BLOOD RIGHT HAND  5 ML IN [facility] BOTTLE     Special Requests Normal     Culture  Setup Time [id number]     Culture       Value:        BLOOD CULTURE RECEIVED NO GROWTH TO DATE CULTURE WILL BE HELD FOR 5 DAYS BEFORE ISSUING A FINAL NEGATIVE REPORT   Report Status PENDING    POCT I-STAT TROPONIN I     Status: Normal   Collection Time   08/04/11  5:56 PM      Component Value Range   Troponin i, poc 0.02  0.00 - 0.08 (ng/mL)   Comment 3           URINALYSIS, ROUTINE W REFLEX MICROSCOPIC     Status: Abnormal   Collection Time   08/04/11  8:24 PM      Component Value Range   Color, Urine YELLOW  YELLOW    APPearance TURBID (*) CLEAR    Specific Gravity, Urine 1.019  1.005 - 1.030    pH 6.0  5.0 - 8.0    Glucose, UA NEGATIVE  NEGATIVE (mg/dL)   Hgb urine dipstick LARGE (*) NEGATIVE    Bilirubin Urine NEGATIVE  NEGATIVE    Ketones, ur NEGATIVE  NEGATIVE (mg/dL)   Protein, ur 30 (*) NEGATIVE (mg/dL)   Urobilinogen, UA 0.2  0.0 - 1.0 (mg/dL)   Nitrite POSITIVE (*) NEGATIVE    Leukocytes, UA LARGE (*) NEGATIVE   URINE MICROSCOPIC-ADD ON     Status: Abnormal   Collection Time   08/04/11  8:24 PM      Component Value Range   WBC, UA TOO NUMEROUS  TO COUNT  <3 (WBC/hpf)   RBC / HPF 21-50  <3 (RBC/hpf)   Bacteria, UA MANY (*) RARE    Casts GRANULAR CAST (*) NEGATIVE   GLUCOSE, CAPILLARY     Status: Abnormal   Collection Time   08/04/11  8:39 PM      Component Value Range   Glucose-Capillary 192 (*) 70 - 99 (mg/dL)   Comment 1 Documented in Chart     Comment 2 Notify RN    MRSA PCR SCREENING     Status: Normal   Collection Time   08/04/11  9:48 PM      Component Value Range   MRSA by PCR NEGATIVE  NEGATIVE   GLUCOSE, CAPILLARY     Status: Abnormal   Collection Time   08/05/11 12:04 AM      Component Value Range   Glucose-Capillary 161 (*) 70 - 99 (mg/dL)   Comment 1 Documented in Chart     Comment 2 Notify RN    BASIC METABOLIC PANEL     Status: Abnormal   Collection Time   08/05/11  3:35 AM      Component Value Range   Sodium 134 (*) 135 - 145 (mEq/L)   Potassium 4.3  3.5 - 5.1 (mEq/L)   Chloride 101  96 - 112 (mEq/L)   CO2 24  19 - 32 (mEq/L)   Glucose, Bld 162 (*) 70 - 99 (mg/dL)   BUN 42 (*) 6 - 23 (mg/dL)   Creatinine, Ser 0.98 (*) 0.50 - 1.35 (mg/dL)   Calcium 8.8  8.4 - 11.9 (mg/dL)   GFR calc non Af Amer 19 (*) >90 (mL/min)   GFR calc Af Amer 22 (*) >90 (mL/min)  CBC     Status: Abnormal   Collection Time   08/05/11  3:35 AM      Component Value Range   WBC 17.4 (*) 4.0 - 10.5 (K/uL)   RBC 3.04 (*) 4.22 - 5.81 (MIL/uL)   Hemoglobin 8.4 (*) 13.0 - 17.0 (g/dL)   HCT 14.7 (*) 82.9 - 52.0 (%)   MCV 88.8  78.0 - 100.0 (fL)   MCH 27.6  26.0 - 34.0 (pg)   MCHC 31.1  30.0 - 36.0 (g/dL)   RDW 56.2  13.0 - 86.5 (%)   Platelets 120 (*) 150 - 400 (K/uL)  GLUCOSE, CAPILLARY     Status: Abnormal   Collection Time   08/05/11  4:15 AM      Component Value Range   Glucose-Capillary 153 (*) 70 - 99 (mg/dL)   Comment 1 Documented in Chart     Comment 2 Notify RN  GLUCOSE, CAPILLARY     Status: Abnormal   Collection Time   08/05/11  8:20 AM      Component Value Range   Glucose-Capillary 151 (*) 70 - 99 (mg/dL)    Comment 1 Documented in Chart     Comment 2 Notify RN       Micro: Recent Results (from the past 240 hour(s))  CULTURE, BLOOD (ROUTINE X 2)     Status: Normal (Preliminary result)   Collection Time   08/04/11  3:00 PM      Component Value Range Status Comment   Specimen Description BLOOD LEFT WRIST  5 ML IN [facility] BOTTLE   Final    Special Requests Normal   Final    Culture  Setup Time [id number]   Final    Culture     Final    Value:        BLOOD CULTURE RECEIVED NO GROWTH TO DATE CULTURE WILL BE HELD FOR 5 DAYS BEFORE ISSUING A FINAL NEGATIVE REPORT   Report Status PENDING   Incomplete   CULTURE, BLOOD (ROUTINE X 2)     Status: Normal (Preliminary result)   Collection Time   08/04/11  3:55 PM      Component Value Range Status Comment   Specimen Description BLOOD RIGHT HAND  5 ML IN [facility] BOTTLE   Final    Special Requests Normal   Final    Culture  Setup Time [id number]   Final    Culture     Final    Value:        BLOOD CULTURE RECEIVED NO GROWTH TO DATE CULTURE WILL BE HELD FOR 5 DAYS BEFORE ISSUING A FINAL NEGATIVE REPORT   Report Status PENDING   Incomplete   MRSA PCR SCREENING     Status: Normal   Collection Time   08/04/11  9:48 PM      Component Value Range Status Comment   MRSA by PCR NEGATIVE  NEGATIVE  Final     Studies/Results: Ct Head Wo Contrast  08/04/2011  *RADIOLOGY REPORT*  Clinical Data: Dementia.  Stroke.  Mental status changes.  CT HEAD WITHOUT CONTRAST  Technique:  Contiguous axial images were obtained from the base of the skull through the vertex without contrast.  Comparison: 07/11/2010  Findings: The brain shows generalized atrophy.  There is a old infarction in the left middle cerebral artery territory which has progressed atrophy encephalomalacia, similar to the previous study. No sign of acute infarction, mass lesion, hemorrhage, hydrocephalus or extra-axial collection.  The calvarium is unremarkable.  There are mucosal inflammatory changes of the  maxillary and ethmoid sinuses.  IMPRESSION: No acute intracranial finding.  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  Some mucosal inflammation of the paranasal sinuses.  Original Report Authenticated By: Thomasenia Sales, M.D.   Dg Chest Portable 1 View  08/04/2011  *RADIOLOGY REPORT*  Clinical Data: Shortness of breath.  Diabetes.  Chronic bronchitis.  PORTABLE CHEST - 1 VIEW  Comparison: 09/25/2010.  Findings: Normal sized heart.  Clear lungs.  Mild osteopenia and bilateral shoulder degenerative changes.  Thoracic spine degenerative changes and mild scoliosis.  IMPRESSION: No acute abnormality.  Original Report Authenticated By: Darrol Angel, M.D.   Dg Abd 2 Views  08/04/2011  *RADIOLOGY REPORT*  Clinical Data: Abdominal pain.  ABDOMEN - 2 VIEW  Comparison: Portable chest obtained earlier today.  Findings: Mildly dilated small bowel loops in the right mid to upper abdomen.  Mildly prominent colon filled  with gas and stool. Prominent stool in the rectum.  No free peritoneal air.  Lumbar and lower thoracic spine degenerative changes.  Faintly visualized left renal calculi, better seen on the CT dated 09/08/2010.  Previously demonstrated right renal calculi are obscured by the bowel.  Right inferior pelvic phlebolith.  IMPRESSION:  1.  Mild small bowel ileus or partial obstruction. 2.  Mildly prominent stool in the rectum. 3.  Left renal calculi.  Original Report Authenticated By: Darrol Angel, M.D.    Medications:  Scheduled Meds:   . sodium chloride  1,000 mL Intravenous Once   Followed by  . sodium chloride  1,000 mL Intravenous Once  . sodium chloride   Intravenous STAT  . acetaminophen  650 mg Oral Once  . ipratropium  0.5 mg Nebulization Q4H   And  . albuterol  2.5 mg Nebulization Q4H  . albuterol  5 mg Nebulization Once  . enoxaparin  30 mg Subcutaneous Q24H  . famotidine (PEPCID) IV  20 mg Intravenous Q12H  . insulin aspart  0-9 Units Subcutaneous Q4H  . ipratropium  0.5 mg Nebulization  Once  . lidocaine      . lidocaine      . piperacillin-tazobactam (ZOSYN)  IV  3.375 g Intravenous Once  . piperacillin-tazobactam (ZOSYN)  IV  3.375 g Intravenous Q8H  . sodium chloride  1,000 mL Intravenous Once  . sodium chloride  3 mL Intravenous Q12H  . vancomycin  750 mg Intravenous Q24H  . vancomycin  1,000 mg Intravenous Once  . DISCONTD: enoxaparin  40 mg Subcutaneous Q24H   Continuous Infusions:   . sodium chloride 1,000 mL (08/04/11 1946)  . sodium chloride 75 mL/hr at 08/04/11 2152   PRN Meds:.acetaminophen, acetaminophen, alum & mag hydroxide-simeth, HYDROmorphone, ondansetron (ZOFRAN) IV, ondansetron (ZOFRAN) IV, ondansetron, oxyCODONE, zolpidem   Assessment: Principal Problem:  *Sepsis Active Problems:  Obstructive chronic bronchitis with exacerbation  Type II or unspecified type diabetes mellitus without mention of complication, not stated as uncontrolled  Dementia  ARF (acute renal failure)  Urinary retention  Fever  Leukocytosis  Flaccid hemiplegia affecting dominant side  Urethral stricture   Plan: #1 sepsis secondary to UTI continue anger and Zosyn for now, follow cultures,recent history of Proteus mirabilis bacteriemia  #2 COPD without exacerbation, continue as needed DuoNeb treatments #3 acute kidney injury with Chronic kidney disease stage III with a history of obstructive uropathy and also urethral strictures. 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

## 2011-08-05 NOTE — Progress Notes (Signed)
CRITICAL VALUE ALERT  Critical value received: positive blood cultures x 2  Date of notification:  08/05/11  Time of notification:  1700  Critical value read back:yes  Nurse who received alert:  Lorrin Jackson RN  MD notified (1st page):  Dr. Susie Cassette  Time of first page: 1700  MD notified (2nd page):n/a  Time of second page:n/a  Responding MD:  Dr. Susie Cassette  Time MD responded: [phone]

## 2011-08-05 NOTE — Progress Notes (Signed)
  Subjective: Patient reports no complaints.  Objective: Vital signs in last 24 hours: Temp:  [98.4 F (36.9 C)-102.8 F (39.3 C)] 99.4 F (37.4 C) (04/28 0800) Pulse Rate:  [90-102] 96  (04/28 0800) Resp:  [20-27] 24  (04/28 0800) BP: (108-138)/(44-85) 126/53 mmHg (04/28 0800) SpO2:  [96 %-100 %] 99 % (04/28 0842) Weight:  [79.9 kg (176 lb 2.4 oz)] 79.9 kg (176 lb 2.4 oz) (04/27 2200)  Intake/Output from previous day: 04/27 0701 - 04/28 0700 In: 685 [I.V.:685] Out: 450 [Urine:450] Intake/Output this shift: Total I/O In: 75 [I.V.:75] Out: 80 [Urine:80]  Physical Exam:  Suprapubic region is free of any mass or tenderness. His Foley catheter is draining clear urine. There is no bleeding from around the catheter noted.  Lab Results:  Basename 08/05/11 0335 08/04/11 1500  HGB 8.4* 9.8*  HCT 27.0* 31.1*   BMET  Basename 08/05/11 0335 08/04/11 1500  NA 134* 133*  K 4.3 4.7  CL 101 98  CO2 24 25  GLUCOSE 162* 136*  BUN 42* 47*  CREATININE 3.08* 3.09*  CALCIUM 8.8 9.6    Basename 08/04/11 1500  LABPT --  INR 1.09    Assessment/Plan: His Foley catheter is draining. He is tolerating the catheter and there is no pericatheter bleeding.  Leave catheter until urine output monitoring is no longer necessary and then may remove.   LOS: 1 day   Dink Creps C 08/05/2011, 10:09 AM

## 2011-08-06 ENCOUNTER — Encounter (HOSPITAL_COMMUNITY): Payer: Self-pay | Admitting: Nurse Practitioner

## 2011-08-06 ENCOUNTER — Inpatient Hospital Stay (HOSPITAL_COMMUNITY): Payer: Medicare Other

## 2011-08-06 LAB — GLUCOSE, CAPILLARY
Glucose-Capillary: 116 mg/dL — ABNORMAL HIGH (ref 70–99)
Glucose-Capillary: 165 mg/dL — ABNORMAL HIGH (ref 70–99)
Glucose-Capillary: 136 mg/dL — ABNORMAL HIGH (ref 70–99)

## 2011-08-06 LAB — BASIC METABOLIC PANEL
CO2: 26 mEq/L (ref 19–32)
Calcium: 9 mg/dL (ref 8.4–10.5)
GFR calc non Af Amer: 19 mL/min — ABNORMAL LOW (ref >90)
Sodium: 138 mEq/L (ref 135–145)

## 2011-08-06 LAB — CBC
Platelets: 129 10*3/uL — ABNORMAL LOW (ref 150–400)
RBC: 3.05 MIL/uL — ABNORMAL LOW (ref 4.22–5.81)
WBC: 13.8 10*3/uL — ABNORMAL HIGH (ref 4.0–10.5)

## 2011-08-06 MED ORDER — IMIPENEM-CILASTATIN 250 MG IV SOLR
250.0000 mg | INTRAVENOUS | Status: AC
  Administered 2011-08-06: 250 mg via INTRAVENOUS
  Filled 2011-08-06: qty 250

## 2011-08-06 MED ORDER — BIOTENE DRY MOUTH MT LIQD
15.0000 mL | Freq: Two times a day (BID) | OROMUCOSAL | Status: DC
  Administered 2011-08-06 – 2011-08-09 (×8): 15 mL via OROMUCOSAL

## 2011-08-06 MED ORDER — CHLORHEXIDINE GLUCONATE 0.12 % MT SOLN
15.0000 mL | Freq: Two times a day (BID) | OROMUCOSAL | Status: DC
  Administered 2011-08-06 – 2011-08-09 (×7): 15 mL via OROMUCOSAL
  Filled 2011-08-06 (×10): qty 15

## 2011-08-06 MED ORDER — SODIUM CHLORIDE 0.9 % IV SOLN
250.0000 mg | Freq: Four times a day (QID) | INTRAVENOUS | Status: DC
  Administered 2011-08-06 – 2011-08-10 (×16): 250 mg via INTRAVENOUS
  Filled 2011-08-06 (×19): qty 250

## 2011-08-06 NOTE — Clinical Documentation Improvement (Signed)
RESPIRATORY FAILURE DOCUMENTATION CLARIFICATION QUERY   THIS DOCUMENT IS NOT A PERMANENT PART OF THE MEDICAL RECORD  TO RESPOND TO THE THIS QUERY, FOLLOW THE INSTRUCTIONS BELOW:  1. If needed, update documentation for the patient's encounter via the notes activity.  2. Access this query again and click edit on the In [employer].  3. After updating, or not, click F2 to complete all highlighted (required) fields concerning your review. Select "additional documentation in the medical record" OR "no additional documentation provided".  4. Click Sign note button.  5. The deficiency will fall out of your In Basket *Please let us know if you are not able to complete this workflow by phone or e-mail (listed below).  Please update your documentation within the medical record to reflect your response to this query.                                                                                    08/06/11  Dear Dr. Susie Cassette Marton Redwood,  In a better effort to capture your patient's severity of illness, reflect appropriate length of stay and utilization of resources, a review of the patient medical record has revealed the following indicators.    Based on your clinical judgment, please clarify and document in a progress note and/or discharge summary the clinical condition associated with the following supporting information:  In responding to this query please exercise your independent judgment.  The fact that a query is asked, does not imply that any particular answer is desired or expected. For accurate Dx specificity & severity please clarify if pt " home on continuous  O2  " be  further  specified as any of the following diagnosis below. Thank you .    Possible Clinical Conditions?  Acute Respiratory Failure Acute on Chronic Respiratory Failure  Chronic Respiratory Failure Acute Respiratory Insufficiency  _______Other Condition________________ _______Cannot Clinically  Determine    Supporting Information:  Risk Factors: COPD without exacerbation on continuous home O2 Signs&Symptoms: SOB  Treatment: O2 Mode:  O2/L/min  Respiratory Treatment: Albuterol, Advair, DUONEB                  You may use possible, probable, or suspect with inpatient documentation. possible, probable, suspected diagnoses MUST be documented at the time of discharge  Reviewed:  no additional documentation provided  Thank You,  Andy Gauss RN  Clinical Documentation Specialist:  Pager [phone] E-mail [email or web address]  Health Information Management [place]

## 2011-08-06 NOTE — Progress Notes (Signed)
Clinical Social Work Department BRIEF PSYCHOSOCIAL ASSESSMENT 08/06/2011  Patient:  Justin Sherman,Justin Sherman     Account Number:  [id number]     Admit date:  08/04/2011  Clinical Social Worker:  Jodelle Red  Date/Time:  08/06/2011 10:09 AM  Referred by:  CSW  Date Referred:  08/06/2011 Referred for  SNF Placement   Other Referral:   Interview type:  Patient Other interview type:   fAMILY-BROTHER    PSYCHOSOCIAL DATA Living Status:  FACILITY Admitted from facility:  [facility] Level of care:  Skilled Nursing Facility Primary support name:  Justin Sherman Primary support relationship to patient:  [place] Degree of support available:   GOOD, EXTENDED FAMILY HELPFUL AND INVOLVED    CURRENT CONCERNS Current Concerns  Post-Acute Placement   Other Concerns:    SOCIAL WORK ASSESSMENT / PLAN CSW MET WITH PT AND CALLED BROTHER TO CHECK IN AS PT FROM [facility]. PT LIMITED IN HIS ABILITY TO EXPRESS NEEDS, BUT DID STATE HE PLANS TO RETURN TO [facility]. CSW CONFIRMED THIS WITH BROTHER. CSW SPOKE TO SNF REP AND PT CAN RETURN. FAMILY PAID BEDHOLD AT FACILITY. PT COPING WELL CURRENTLY.   Assessment/plan status:  Psychosocial Support/Ongoing Assessment of Needs Other assessment/ plan:   RETURN TO SNF AT D/C   Information/referral to community resources:    PATIENT'S/FAMILY'S RESPONSE TO PLAN OF CARE: BROTHER, Justin Sherman AGREES TO PLAN TO RETURN TO SNF. CSW TO FOLLOW FOR RETURN TO SNF    Vennie Homans, [place] 08/06/2011 10:17 AM [phone]

## 2011-08-06 NOTE — Progress Notes (Signed)
Subjective: Still spiking high grade fever  Objective: Vital signs in last 24 hours: Filed Vitals:   08/06/11 0500 08/06/11 0600 08/06/11 0700 08/06/11 0732  BP: 134/64 134/58 133/58   Pulse: 97 93 91   Temp:      TempSrc:      Resp: 23 22 21    Height:      Weight:      SpO2: 95% 96% 97% 97%    Intake/Output Summary (Last 24 hours) at 08/06/11 0800 Last data filed at 08/06/11 0600  Gross per 24 hour  Intake   1925 ml  Output   1525 ml  Net    400 ml    Weight change:   PSYCH: He is alert and oriented x2; does appear anxious  HEENT: Normocephalic and Atraumatic, Mucous membranes pink; PERRLA; EOM intact; Fundi: Benign; No scleral icterus, Nares: Patent, Oropharynx: Clear, Edentulous, Neck: FROM, no cervical lymphadenopathy nor thyromegaly or carotid bruit; no JVD;  Breasts:: Not examined  CHEST WALL: No tenderness  CHEST: Normal respiration, clear to auscultation bilaterally  HEART: Regular rate and rhythm; no murmurs rubs or gallops  BACK: No kyphosis or scoliosis; no CVA tenderness  ABDOMEN: Positive Bowel Sounds, Distended, tympanic in all 4 quadrants, Obese, soft non-tender; no masses, no organomegaly.  Rectal Exam: Not done  EXTREMITIES: No bone or joint deformity; age-appropriate arthropathy of the hands and knees; no cyanosis, clubbing or edema; no ulcerations.  Genitalia: not examined  PULSES: 2+ and symmetric  SKIN: Normal hydration no rash or ulceration  CNS: Chronic Expressive Aphasia, and Right Hemiplegia and Hemiparesis.      Lab Results: Results for orders placed during the hospital encounter of 08/04/11 (from the past 24 hour(s))  GLUCOSE, CAPILLARY     Status: Abnormal   Collection Time   08/05/11  8:20 AM      Component Value Range   Glucose-Capillary 151 (*) 70 - 99 (mg/dL)   Comment 1 Documented in Chart     Comment 2 Notify RN    GLUCOSE, CAPILLARY     Status: Abnormal   Collection Time   08/05/11 12:35 PM      Component Value Range   Glucose-Capillary 149 (*) 70 - 99 (mg/dL)   Comment 1 Documented in Chart     Comment 2 Notify RN    GLUCOSE, CAPILLARY     Status: Abnormal   Collection Time   08/05/11  4:09 PM      Component Value Range   Glucose-Capillary 166 (*) 70 - 99 (mg/dL)   Comment 1 Documented in Chart     Comment 2 Notify RN    GLUCOSE, CAPILLARY     Status: Abnormal   Collection Time   08/05/11  8:10 PM      Component Value Range   Glucose-Capillary 127 (*) 70 - 99 (mg/dL)   Comment 1 Documented in Chart     Comment 2 Notify RN    GLUCOSE, CAPILLARY     Status: Abnormal   Collection Time   08/06/11 12:19 AM      Component Value Range   Glucose-Capillary 146 (*) 70 - 99 (mg/dL)   Comment 1 Documented in Chart     Comment 2 Notify RN    GLUCOSE, CAPILLARY     Status: Abnormal   Collection Time   08/06/11  3:59 AM      Component Value Range   Glucose-Capillary 163 (*) 70 - 99 (mg/dL)   Comment 1 Documented in  Chart     Comment 2 Notify RN    GLUCOSE, CAPILLARY     Status: Abnormal   Collection Time   08/06/11  7:42 AM      Component Value Range   Glucose-Capillary 116 (*) 70 - 99 (mg/dL)   Comment 1 Documented in Chart     Comment 2 Notify RN       Micro: Recent Results (from the past 240 hour(s))  CULTURE, BLOOD (ROUTINE X 2)     Status: Normal (Preliminary result)   Collection Time   08/04/11  3:00 PM      Component Value Range Status Comment   Specimen Description BLOOD LEFT WRIST  5 ML IN [facility] BOTTLE   Final    Special Requests Normal   Final    Culture  Setup Time [id number]   Final    Culture     Final    Value: GRAM NEGATIVE RODS     Note: Gram Stain Report Called to,Read Back By and Verified With: RN A. ARNOLD ON 08/05/11 AT 1630 BY TEDAR   Report Status PENDING   Incomplete   CULTURE, BLOOD (ROUTINE X 2)     Status: Normal (Preliminary result)   Collection Time   08/04/11  3:55 PM      Component Value Range Status Comment   Specimen Description BLOOD RIGHT HAND  5 ML IN [facility]  BOTTLE   Final    Special Requests Normal   Final    Culture  Setup Time [id number]   Final    Culture     Final    Value: GRAM NEGATIVE RODS     Note: Gram Stain Report Called to,Read Back By and Verified With: RN A. ARNOLD ON 08/05/11 AT 1630 BY TEDAR   Report Status PENDING   Incomplete   URINE CULTURE     Status: Normal   Collection Time   08/04/11  8:24 PM      Component Value Range Status Comment   Specimen Description URINE, CATHETERIZED   Final    Special Requests Normal   Final    Culture  Setup Time [id number]   Final    Colony Count 75,000 COLONIES/ML   Final    Culture     Final    Value: Multiple bacterial morphotypes present, none predominant. Suggest appropriate recollection if clinically indicated.   Report Status 08/05/2011 FINAL   Final   MRSA PCR SCREENING     Status: Normal   Collection Time   08/04/11  9:48 PM      Component Value Range Status Comment   MRSA by PCR NEGATIVE  NEGATIVE  Final     Studies/Results: Ct Head Wo Contrast  08/04/2011  *RADIOLOGY REPORT*  Clinical Data: Dementia.  Stroke.  Mental status changes.  CT HEAD WITHOUT CONTRAST  Technique:  Contiguous axial images were obtained from the base of the skull through the vertex without contrast.  Comparison: 07/11/2010  Findings: The brain shows generalized atrophy.  There is a old infarction in the left middle cerebral artery territory which has progressed atrophy encephalomalacia, similar to the previous study. No sign of acute infarction, mass lesion, hemorrhage, hydrocephalus or extra-axial collection.  The calvarium is unremarkable.  There are mucosal inflammatory changes of the maxillary and ethmoid sinuses.  IMPRESSION: No acute intracranial finding.  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  Some mucosal inflammation of the paranasal sinuses.  Original Report Authenticated By: Thomasenia Sales, M.D.   Dg  Chest Portable 1 View  08/04/2011  *RADIOLOGY REPORT*  Clinical Data: Shortness of breath.   Diabetes.  Chronic bronchitis.  PORTABLE CHEST - 1 VIEW  Comparison: 09/25/2010.  Findings: Normal sized heart.  Clear lungs.  Mild osteopenia and bilateral shoulder degenerative changes.  Thoracic spine degenerative changes and mild scoliosis.  IMPRESSION: No acute abnormality.  Original Report Authenticated By: Darrol Angel, M.D.   Dg Abd 2 Views  08/04/2011  *RADIOLOGY REPORT*  Clinical Data: Abdominal pain.  ABDOMEN - 2 VIEW  Comparison: Portable chest obtained earlier today.  Findings: Mildly dilated small bowel loops in the right mid to upper abdomen.  Mildly prominent colon filled with gas and stool. Prominent stool in the rectum.  No free peritoneal air.  Lumbar and lower thoracic spine degenerative changes.  Faintly visualized left renal calculi, better seen on the CT dated 09/08/2010.  Previously demonstrated right renal calculi are obscured by the bowel.  Right inferior pelvic phlebolith.  IMPRESSION:  1.  Mild small bowel ileus or partial obstruction. 2.  Mildly prominent stool in the rectum. 3.  Left renal calculi.  Original Report Authenticated By: Darrol Angel, M.D.    Medications:  Scheduled Meds:   . sodium chloride   Intravenous STAT  . ipratropium  0.5 mg Nebulization Q4H   And  . albuterol  2.5 mg Nebulization Q4H  . enoxaparin  30 mg Subcutaneous Q24H  . famotidine (PEPCID) IV  20 mg Intravenous Q12H  . insulin aspart  0-9 Units Subcutaneous Q4H  . piperacillin-tazobactam (ZOSYN)  IV  3.375 g Intravenous Q8H  . sodium chloride  1,000 mL Intravenous Once  . sodium chloride  3 mL Intravenous Q12H  . vancomycin  750 mg Intravenous Q24H  . DISCONTD: enoxaparin  40 mg Subcutaneous Q24H   Continuous Infusions:   . sodium chloride 1,000 mL (08/05/11 1226)  . sodium chloride 75 mL/hr at 08/06/11 0147   PRN Meds:.acetaminophen, acetaminophen, alum & mag hydroxide-simeth, HYDROmorphone, ondansetron (ZOFRAN) IV, ondansetron, oxyCODONE, zolpidem   Assessment: Principal  Problem:  *Sepsis Active Problems:  Obstructive chronic bronchitis with exacerbation  Type II or unspecified type diabetes mellitus without mention of complication, not stated as uncontrolled  Dementia  ARF (acute renal failure)  Urinary retention  Fever  Leukocytosis  Flaccid hemiplegia affecting dominant side  Urethral stricture   Plan: #1 sepsis secondary to UTI  , history of urethral stricture, status post placement of a Foley catheter, status post dilatation of a urethral stricture on the 27th of the continue anger and Zosyn for now, follow cultures,recent history of Proteus mirabilis bacteriemia , also had Klebsiella pneumonia during her recent admission that was resistant to ampicillin, we will DC Zosyn and switch to imipenem  #2 COPD without exacerbation, continue as needed DuoNeb treatments   #3 acute kidney injury with Chronic kidney disease stage III with a history of obstructive uropathy and also urethral strictures. 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 08/06/2011, 8:00 AM

## 2011-08-06 NOTE — Progress Notes (Signed)
CARE MANAGEMENT NOTE 08/06/2011  Patient:  Justin Sherman,Justin Sherman   Account Number:  [id number]  Date Initiated:  08/06/2011  Documentation initiated by:  Laylani Pudwill  Subjective/Objective Assessment:   pt with history of obstructive urinary status , temp 102, cva in the past     Action/Plan:   lives at [facility]   Anticipated DC Date:  08/09/2011   Anticipated DC Plan:  SKILLED NURSING FACILITY  In-house referral  Clinical Social Worker      DC Planning Services  NA      [facility] Choice  NA   Choice offered to / List presented to:  NA   DME arranged  NA      DME agency  NA     HH arranged  NA      HH agency  NA   Status of service:  In process, will continue to follow Medicare Important Message given?  NA - LOS <3 / Initial given by admissions (If response is "NO", the following Medicare IM given date fields will be blank) Date Medicare IM given:   Date Additional Medicare IM given:    Discharge Disposition:    Per UR Regulation:  Reviewed for med. necessity/level of care/duration of stay  If discussed at Long Length of Stay Meetings, dates discussed:    Comments:  [id number]/Bisma Klett Lorrin Mais Case Management [phone]

## 2011-08-06 NOTE — Progress Notes (Signed)
ANTIBIOTIC CONSULT NOTE - INITIAL  Pharmacy Consult for Vancomycin/Imipenem  Indication:  Sepsis 2/2 GNR bacteremia   No Known Allergies  Patient Measurements: Height: 5\' 10"  (177.8 cm) Weight: 176 lb 2.4 oz (79.9 kg) IBW/kg (Calculated) : 73   Vital Signs: Temp: 98.2 F (36.8 C) (04/29 0800) Temp src: Oral (04/29 0800) BP: 133/58 mmHg (04/29 0700) Pulse Rate: 91  (04/29 0700) Intake/Output from previous day: 04/28 0701 - 04/29 0700 In: 2000 [I.V.:1650; IV Piggyback:350] Out: 1605 [Urine:1605] Intake/Output from this shift:    Labs:  Basename 08/05/11 0335 08/04/11 1500  WBC 17.4* 23.3*  HGB 8.4* 9.8*  PLT 120* 135*  LABCREA -- --  CREATININE 3.08* 3.09*   Estimated Creatinine Clearance: 22.1 ml/min (by C-G formula based on Cr of 3.08). No results found for this basename: VANCOTROUGH:2,VANCOPEAK:2,VANCORANDOM:2,GENTTROUGH:2,GENTPEAK:2,GENTRANDOM:2,TOBRATROUGH:2,TOBRAPEAK:2,TOBRARND:2,AMIKACINPEAK:2,AMIKACINTROU:2,AMIKACIN:2, in the last 72 hours   Assessment:  89 YOM presented with sepsis, ARF, urinary retention. Today is D3 of Vancomycin/Zosyn, MD ordered to D/C zosyn and start Imipenem given h/o proteus mirabilis bacteremia and Klebsiella UTI that was resistant to ampicillin, sens to imipenem.   Tmax 102.5, WBC improved 17.4K, ARF with Scr of 3 (baseline 1.6 per MD) - CrCl 22 ml/min normalized.   Blood cultures x 2 with GRN.  Urine culture with 75K of multiple organisms.    Goal of Therapy:  Vancomycin trough level 15-20 mcg/ml Appropriate renal adjustment of imipenem  Plan:   D/C Zosyn  Start Imipenem 250 mg IV q6h  Continue Vancomycin 750 mg IV q24h  Monitor renal function and adjust abxs as needed.    Geoffry Paradise Thi 08/06/2011,8:38 AM

## 2011-08-06 NOTE — Evaluation (Signed)
Clinical/Bedside Swallow Evaluation Patient Details  Name: Jobe Mutch MRN: [id number] DOB: 1940-08-25 Today's Date: 08/06/2011  Past Medical History:  Past Medical History  Diagnosis Date  . Alterations of sensations, late effect of cerebrovascular disease   . Acute conjunctivitis, unspecified   . Aortic aneurysm of unspecified site without mention of rupture   . Flaccid hemiplegia affecting dominant side   . Type II or unspecified type diabetes mellitus without mention of complication, not stated as uncontrolled   . Depressive disorder, not elsewhere classified   . Unspecified essential hypertension   . Obstructive chronic bronchitis with exacerbation   . Anxiety state, unspecified   . Esophageal reflux   . Other and unspecified hyperlipidemia   . Pressure ulcer, other site   . Dementia    Past Surgical History: History reviewed. No pertinent past surgical history. HPI:  71 y.o. male admitted from SNF with urinary retention and sepsis. PMH + old CVA with residual aphasia and right sided weakness, esophageal reflux, DM 2, COPD with O2, dementia. Prior MBSS 07/27/2010 revealed oropharyngeal swallow WFL and regular solids and thin liquids were recommended.  CXR revealed clear lungs, temp 99 degrees. Pt has been NPO since admit.   Assessment/Recommendations/Treatment Plan Suspected Esophageal Findings Suspected Esophageal Findings: Other (comment) (h/o esophageal reflux)  SLP Assessment Clinical Impression Statement: No overt s/s of aspiration across consistencies. Pt with timely swallow, voice clear after all PO trials. Pt tolerated successive straw sips of water and coke without overt difficulty. Mastication is mildly slow, rec Dys 3 with thin liquids. Risk for Aspiration: Mild Other Related Risk Factors: History of GERD;Previous CVA  Swallow Evaluation Recommendations Diet Recommendations: Dysphagia 3 (Mechanical Soft);Thin liquid Liquid Administration via: Straw;Cup Medication  Administration: Whole meds with liquid Supervision: Patient able to self feed;Full supervision/cueing for compensatory strategies Compensations: Small sips/bites Postural Changes and/or Swallow Maneuvers: Seated upright 90 degrees Oral Care Recommendations: Oral care before and after PO Other Recommendations: Clarify dietary restrictions (h/o DM2) Follow up Recommendations: None  Treatment Plan Speech Therapy Frequency: min 2x/week Treatment Duration: 1 week Interventions: Aspiration precaution training;Diet toleration management by SLP;Patient/family education  Prognosis Prognosis for Safe Diet Advancement: Good Barriers to Reach Goals: Language deficits  Individuals Consulted Consulted and Agree with Results and Recommendations: Patient  Swallowing Goals  SLP Swallowing Goals Patient will consume recommended diet without observed clinical signs of aspiration with: Minimal assistance Patient will utilize recommended strategies during swallow to increase swallowing safety with: Minimal assistance      Haydyn Girvan, Radene Journey 08/06/2011,10:03 AM

## 2011-08-07 ENCOUNTER — Encounter (HOSPITAL_COMMUNITY): Payer: Self-pay | Admitting: Registered Nurse

## 2011-08-07 ENCOUNTER — Encounter (HOSPITAL_COMMUNITY)
Admission: EM | Disposition: A | Discharge status: Skilled Nursing Facility | Payer: Self-pay | Source: Home / Self Care | Attending: Internal Medicine

## 2011-08-07 ENCOUNTER — Inpatient Hospital Stay (HOSPITAL_COMMUNITY): Payer: Medicare Other

## 2011-08-07 ENCOUNTER — Encounter (HOSPITAL_COMMUNITY): Payer: Self-pay | Admitting: Radiology

## 2011-08-07 ENCOUNTER — Inpatient Hospital Stay (HOSPITAL_COMMUNITY): Payer: Medicare Other | Admitting: Registered Nurse

## 2011-08-07 HISTORY — PX: CYSTOSCOPY W/ URETERAL STENT PLACEMENT: SHX1429

## 2011-08-07 LAB — CULTURE, BLOOD (ROUTINE X 2): Culture  Setup Time: 201304272031

## 2011-08-07 LAB — GLUCOSE, CAPILLARY
Glucose-Capillary: 115 mg/dL — ABNORMAL HIGH (ref 70–99)
Glucose-Capillary: 107 mg/dL — ABNORMAL HIGH (ref 70–99)
Glucose-Capillary: 118 mg/dL — ABNORMAL HIGH (ref 70–99)

## 2011-08-07 LAB — BASIC METABOLIC PANEL
BUN: 33 mg/dL — ABNORMAL HIGH (ref 6–23)
Chloride: 107 mEq/L (ref 96–112)
GFR calc Af Amer: 26 mL/min — ABNORMAL LOW (ref >90)
Glucose, Bld: 136 mg/dL — ABNORMAL HIGH (ref 70–99)
Potassium: 4.2 mEq/L (ref 3.5–5.1)
Sodium: 139 mEq/L (ref 135–145)

## 2011-08-07 SURGERY — CYSTOSCOPY, WITH RETROGRADE PYELOGRAM AND URETERAL STENT INSERTION
Anesthesia: General | Site: Ureter | Laterality: Bilateral | Wound class: Dirty or Infected

## 2011-08-07 MED ORDER — FENTANYL CITRATE 0.05 MG/ML IJ SOLN
INTRAMUSCULAR | Status: DC | PRN
  Administered 2011-08-07: 100 ug via INTRAVENOUS
  Administered 2011-08-07: 50 ug via INTRAVENOUS

## 2011-08-07 MED ORDER — LIDOCAINE HCL (CARDIAC) 10 MG/ML IV SOLN
INTRAVENOUS | Status: DC | PRN
  Administered 2011-08-07: 50 mg via INTRAVENOUS

## 2011-08-07 MED ORDER — SODIUM CHLORIDE 0.9 % IJ SOLN
INTRAMUSCULAR | Status: DC | PRN
  Administered 2011-08-07: 18:00:00

## 2011-08-07 MED ORDER — INDIGOTINDISULFONATE SODIUM 8 MG/ML IJ SOLN
INTRAMUSCULAR | Status: AC
  Filled 2011-08-07: qty 5

## 2011-08-07 MED ORDER — PHENYLEPHRINE HCL 10 MG/ML IJ SOLN
INTRAMUSCULAR | Status: DC | PRN
  Administered 2011-08-07: 80 ug via INTRAVENOUS
  Administered 2011-08-07: 160 ug via INTRAVENOUS

## 2011-08-07 MED ORDER — ONDANSETRON HCL 4 MG/2ML IJ SOLN
INTRAMUSCULAR | Status: DC | PRN
  Administered 2011-08-07: 4 mg via INTRAVENOUS

## 2011-08-07 MED ORDER — STERILE WATER FOR IRRIGATION IR SOLN
Status: DC | PRN
  Administered 2011-08-07: 1000 mL

## 2011-08-07 MED ORDER — HYDROMORPHONE HCL PF 1 MG/ML IJ SOLN
INTRAMUSCULAR | Status: AC
  Filled 2011-08-07: qty 1

## 2011-08-07 MED ORDER — PROPOFOL 10 MG/ML IV BOLUS
INTRAVENOUS | Status: DC | PRN
  Administered 2011-08-07: 160 mg via INTRAVENOUS

## 2011-08-07 MED ORDER — MIDAZOLAM HCL 5 MG/5ML IJ SOLN
INTRAMUSCULAR | Status: DC | PRN
  Administered 2011-08-07: 1 mg via INTRAVENOUS
  Administered 2011-08-07: .5 mg via INTRAVENOUS

## 2011-08-07 MED ORDER — SODIUM CHLORIDE 0.9 % IV SOLN
INTRAVENOUS | Status: DC
  Administered 2011-08-07: 19:00:00 via INTRAVENOUS

## 2011-08-07 MED ORDER — HYDROMORPHONE HCL PF 1 MG/ML IJ SOLN
0.2500 mg | INTRAMUSCULAR | Status: DC | PRN
  Administered 2011-08-07: 0.25 mg via INTRAVENOUS

## 2011-08-07 MED ORDER — IOHEXOL 300 MG/ML  SOLN
INTRAMUSCULAR | Status: AC
  Filled 2011-08-07: qty 1

## 2011-08-07 MED ORDER — LIDOCAINE HCL 2 % EX GEL
CUTANEOUS | Status: AC
  Filled 2011-08-07: qty 10

## 2011-08-07 MED ORDER — SODIUM CHLORIDE 0.9 % IV SOLN
INTRAVENOUS | Status: DC | PRN
  Administered 2011-08-07: 18:00:00 via INTRAVENOUS

## 2011-08-07 MED ORDER — BELLADONNA ALKALOIDS-OPIUM 16.2-60 MG RE SUPP
RECTAL | Status: DC | PRN
  Administered 2011-08-07: 1 via RECTAL

## 2011-08-07 MED ORDER — BELLADONNA ALKALOIDS-OPIUM 16.2-60 MG RE SUPP
RECTAL | Status: AC
  Filled 2011-08-07: qty 1

## 2011-08-07 MED ORDER — SODIUM CHLORIDE 0.9 % IV SOLN
1000.0000 mL | INTRAVENOUS | Status: DC
  Administered 2011-08-09 – 2011-08-10 (×3): 1000 mL via INTRAVENOUS

## 2011-08-07 MED ORDER — SUCCINYLCHOLINE CHLORIDE 20 MG/ML IJ SOLN
INTRAMUSCULAR | Status: DC | PRN
  Administered 2011-08-07: 100 mg via INTRAVENOUS

## 2011-08-07 SURGICAL SUPPLY — 19 items
ADAPTER CATH URET PLST 4-6FR (CATHETERS) ×2 IMPLANT
BAG URO CATCHER STRL LF (DRAPE) ×2 IMPLANT
CATH FOLEY 2WAY SLVR  5CC 16FR (CATHETERS) ×1
CATH FOLEY 2WAY SLVR 5CC 16FR (CATHETERS) ×1 IMPLANT
CATH INTERMIT  6FR 70CM (CATHETERS) ×2 IMPLANT
CLOTH BEACON ORANGE TIMEOUT ST (SAFETY) ×2 IMPLANT
DRAPE CAMERA CLOSED 9X96 (DRAPES) ×2 IMPLANT
GLOVE BIOGEL M STRL SZ7.5 (GLOVE) ×6 IMPLANT
GOWN STRL NON-REIN LRG LVL3 (GOWN DISPOSABLE) ×2 IMPLANT
GOWN STRL REIN XL XLG (GOWN DISPOSABLE) ×2 IMPLANT
GUIDEWIRE ANG ZIPWIRE 038X150 (WIRE) ×2 IMPLANT
GUIDEWIRE STR DUAL SENSOR (WIRE) ×2 IMPLANT
MANIFOLD NEPTUNE II (INSTRUMENTS) ×2 IMPLANT
NS IRRIG 1000ML POUR BTL (IV SOLUTION) ×2 IMPLANT
PACK CYSTO (CUSTOM PROCEDURE TRAY) ×2 IMPLANT
SCRUB PCMX 4 OZ (MISCELLANEOUS) ×2 IMPLANT
STENT CONTOUR 6FRX26X.038 (STENTS) ×4 IMPLANT
TUBING CONNECTING 10 (TUBING) ×2 IMPLANT
URINEMETER 200ML W/220 (MISCELLANEOUS) ×2 IMPLANT

## 2011-08-07 NOTE — Anesthesia Postprocedure Evaluation (Signed)
  Anesthesia Post-op Note  Patient: Justin Sherman  Procedure(s) Performed: Procedure(s) (LRB): CYSTOSCOPY WITH RETROGRADE PYELOGRAM/URETERAL STENT PLACEMENT (Bilateral)  Patient Location: PACU  Anesthesia Type: General  Level of Consciousness: awake and alert   Airway and Oxygen Therapy: Patient Spontanous Breathing  Post-op Pain: mild  Post-op Assessment: Post-op Vital signs reviewed, Patient's Cardiovascular Status Stable, Respiratory Function Stable, Patent Airway and No signs of Nausea or vomiting  Post-op Vital Signs: stable  Complications: No apparent anesthesia complications

## 2011-08-07 NOTE — Progress Notes (Signed)
SLP Cancellation Note  Treatment cancelled today due to patient being NPO for possible urology procedure.  SLP to return later in week to assure tolerance of po diet and education of precautions.  Donavan Burnet, MS [facility] SLP [phone]

## 2011-08-07 NOTE — Progress Notes (Signed)
Subjective: Patient returns post CT. Reviewed with Radiology. Bilateral ureteral stones, with L hydronephrosis,and acute renal failure.   Objective: Vital signs in last 24 hours: Temp:  [98.5 F (36.9 C)-103.1 F (39.5 C)] 103.1 F (39.5 C) (04/30 1600) Pulse Rate:  [71-105] 103  (04/30 1529) Resp:  [15-26] 24  (04/30 1529) BP: (110-168)/(42-93) 152/78 mmHg (04/30 1529) SpO2:  [94 %-100 %] 96 % (04/30 1529) Weight:  [81.9 kg (180 lb 8.9 oz)] 81.9 kg (180 lb 8.9 oz) (04/30 0000)A  Intake/Output from previous day: 04/29 0701 - 04/30 0700 In: 3880 [P.O.:480; I.V.:2750; IV Piggyback:650] Out: 1450 [Urine:1450] Intake/Output this shift: Total I/O In: 1505 [P.O.:480; I.V.:775; IV Piggyback:250] Out: 650 [Urine:650]  Past Medical History  Diagnosis Date  . Alterations of sensations, late effect of cerebrovascular disease   . Acute conjunctivitis, unspecified   . Aortic aneurysm of unspecified site without mention of rupture   . Flaccid hemiplegia affecting dominant side   . Type II or unspecified type diabetes mellitus without mention of complication, not stated as uncontrolled   . Depressive disorder, not elsewhere classified   . Unspecified essential hypertension   . Obstructive chronic bronchitis with exacerbation   . Anxiety state, unspecified   . Esophageal reflux   . Other and unspecified hyperlipidemia   . Pressure ulcer, other site   . Dementia     Physical Exam:  General: no change Lungs - Normal respiratory effort, chest expands symmetrically.  Abdomen - Soft, non-tender & non-distended.  Lab Results:  Basename 08/06/11 0825 08/05/11 0335  WBC 13.8* 17.4*  HGB 8.4* 8.4*  HCT 26.9* 27.0*   BMET  Basename 08/07/11 1430 08/06/11 0825  NA 139 138  K 4.2 4.1  CL 107 104  CO2 24 26  GLUCOSE 136* 142*  BUN 33* 36*  CREATININE 2.60* 2.97*  CALCIUM 8.8 9.0   No results found for this basename: LABURIN:1 in the last 72 hours Results for orders placed  during the hospital encounter of 08/04/11  CULTURE, BLOOD (ROUTINE X 2)     Status: Normal   Collection Time   08/04/11  3:00 PM      Component Value Range Status Comment   Specimen Description BLOOD LEFT WRIST  5 ML IN [facility] BOTTLE   Final    Special Requests Normal   Final    Culture  Setup Time [id number]   Final    Culture     Final    Value: KLEBSIELLA PNEUMONIAE     Note: SUSCEPTIBILITIES PERFORMED ON PREVIOUS CULTURE WITHIN THE LAST 5 DAYS.     Note: Gram Stain Report Called to,Read Back By and Verified With: RN A. ARNOLD ON 08/05/11 AT 1630 BY TEDAR   Report Status 08/07/2011 FINAL   Final   CULTURE, BLOOD (ROUTINE X 2)     Status: Normal   Collection Time   08/04/11  3:55 PM      Component Value Range Status Comment   Specimen Description BLOOD RIGHT HAND  5 ML IN [facility] BOTTLE   Final    Special Requests Normal   Final    Culture  Setup Time [id number]   Final    Culture     Final    Value: KLEBSIELLA PNEUMONIAE     Note: Confirmed Extended Spectrum Beta-Lactamase Producer (ESBL)     Note: Gram Stain Report Called to,Read Back By and Verified With: RN A. ARNOLD ON 08/05/11 AT 1630 BY TEDAR   Report Status 08/07/2011 FINAL  Final    Organism ID, Bacteria KLEBSIELLA PNEUMONIAE   Final   URINE CULTURE     Status: Normal   Collection Time   08/04/11  8:24 PM      Component Value Range Status Comment   Specimen Description URINE, CATHETERIZED   Final    Special Requests Normal   Final    Culture  Setup Time [id number]   Final    Colony Count 75,000 COLONIES/ML   Final    Culture     Final    Value: Multiple bacterial morphotypes present, none predominant. Suggest appropriate recollection if clinically indicated.   Report Status 08/05/2011 FINAL   Final   MRSA PCR SCREENING     Status: Normal   Collection Time   08/04/11  9:48 PM      Component Value Range Status Comment   MRSA by PCR NEGATIVE  NEGATIVE  Final     Studies/Results: @[id number] @  Assessment/Plan:Although  Cr is better, he has bilateral stones, and urosepsis. He may improve with JJ stents. Have called [place].   [place] Severs I 08/07/2011, 4:53 PM

## 2011-08-07 NOTE — Consult Note (Signed)
Urology Consult  Referring physician: Abroul Reason for referral: urosepsis with  stone  History of Present Illness: Mr. Justin Sherman is a 71 yo male, resident of [facility], seen in 2012 for urethral stricture disease by Dr. Lynnae Sandhoff, ndmore recently by Dr. Vernie Ammons. He is now admitted on 08/04/11 with: (1). 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 Alterations of sensations, late effect of cerebrovascular disease   . Acute conjunctivitis, unspecified   . Aortic aneurysm of unspecified site without mention of rupture   . Flaccid hemiplegia affecting dominant side   . Type II or unspecified type diabetes mellitus without mention of complication, not stated as uncontrolled   . Depressive disorder, not elsewhere classified   . Unspecified essential hypertension   . Obstructive chronic bronchitis with exacerbation   . Anxiety state, unspecified   . Esophageal reflux   . Other and unspecified hyperlipidemia   . Pressure ulcer, other site   . Dementia    History reviewed. No pertinent past surgical history.  Medications: I have reviewed the patient's current medications.  Allergies: No Known Allergies  History reviewed. No pertinent family history.  Social History:  reports that he has never smoked. He does not have any smokeless tobacco history on file. He reports that he does not drink alcohol or use illicit drugs. ROS: Pt is aphasic. Nods head, ? Competent. No acute flank pain. (denies pain). Has irritation from catheter  Physical Exam:  Vital signs in last 24 hours: Temp:  [98.5 F (36.9 C)-103.1 F (39.5 C)] 98.5 F (36.9 C) (04/30 0800) Pulse Rate:  [71-105] 95  (04/30 1100) Resp:  [15-27] 23  (04/30 1100) BP: (110-167)/(42-93) 153/68 mmHg (04/30 0900) SpO2:   [90 %-100 %] 94 % (04/30 1100) Weight:  [81.9 kg (180 lb 8.9 oz)] 81.9 kg (180 lb 8.9 oz) (04/30 0000) PE: Abd benign. No flank pain . No masses  Laboratory Data:  Results for orders placed during the hospital encounter of 08/04/11 (from the past 72 hour(s))  CBC     Status: Abnormal   Collection Time   08/04/11  3:00 PM      Component Value Range Comment   WBC 23.3 (*) 4.0 - 10.5 (K/uL)    RBC 3.54 (*) 4.22 - 5.81 (MIL/uL)    Hemoglobin 9.8 (*) 13.0 - 17.0 (g/dL)    HCT 23.7 (*) 62.8 - 52.0 (%)    MCV 87.9  78.0 - 100.0 (fL)    MCH 27.7  26.0 - 34.0 (pg)    MCHC 31.5  30.0 - 36.0 (g/dL)    RDW 31.5  17.6 - 16.0 (%)    Platelets 135 (*) 150 - 400 (K/uL)   DIFFERENTIAL     Status: Abnormal   Collection Time   08/04/11  3:00 PM      Component Value Range Comment   Neutrophils Relative 89 (*) 43 - 77 (%)    Neutro Abs 20.6 (*) 1.7 - 7.7 (K/uL)    Lymphocytes Relative 3 (*) 12 - 46 (%)    Lymphs Abs 0.8  0.7 - 4.0 (K/uL)    Monocytes Relative 8  3 - 12 (%)    Monocytes Absolute 1.8 (*) 0.1 - 1.0 (K/uL)    Eosinophils Relative  0  0 - 5 (%)    Eosinophils Absolute 0.0  0.0 - 0.7 (K/uL)    Basophils Relative 0  0 - 1 (%)    Basophils Absolute 0.0  0.0 - 0.1 (K/uL)   COMPREHENSIVE METABOLIC PANEL     Status: Abnormal   Collection Time   08/04/11  3:00 PM      Component Value Range Comment   Sodium 133 (*) 135 - 145 (mEq/L)    Potassium 4.7  3.5 - 5.1 (mEq/L)    Chloride 98  96 - 112 (mEq/L)    CO2 25  19 - 32 (mEq/L)    Glucose, Bld 136 (*) 70 - 99 (mg/dL)    BUN 47 (*) 6 - 23 (mg/dL)    Creatinine, Ser 1.61 (*) 0.50 - 1.35 (mg/dL)    Calcium 9.6  8.4 - 10.5 (mg/dL)    Total Protein 7.0  6.0 - 8.3 (g/dL)    Albumin 3.3 (*) 3.5 - 5.2 (g/dL)    AST 18  0 - 37 (U/L)    ALT 10  0 - 53 (U/L)    Alkaline Phosphatase 67  39 - 117 (U/L)    Total Bilirubin 0.3  0.3 - 1.2 (mg/dL)    GFR calc non Af Amer 19 (*) >90 (mL/min)    GFR calc Af Amer 21 (*) >90 (mL/min)   PROTIME-INR      Status: Normal   Collection Time   08/04/11  3:00 PM      Component Value Range Comment   Prothrombin Time 14.3  11.6 - 15.2 (seconds)    INR 1.09  0.00 - 1.49    LIPASE, BLOOD     Status: Normal   Collection Time   08/04/11  3:00 PM      Component Value Range Comment   Lipase 32  11 - 59 (U/L)   CULTURE, BLOOD (ROUTINE X 2)     Status: Normal   Collection Time   08/04/11  3:00 PM      Component Value Range Comment   Specimen Description BLOOD LEFT WRIST  5 ML IN [facility] BOTTLE      Special Requests Normal      Culture  Setup Time [id number]      Culture        Value: KLEBSIELLA PNEUMONIAE     Note: SUSCEPTIBILITIES PERFORMED ON PREVIOUS CULTURE WITHIN THE LAST 5 DAYS.     Note: Gram Stain Report Called to,Read Back By and Verified With: RN A. ARNOLD ON 08/05/11 AT 1630 BY TEDAR   Report Status 08/07/2011 FINAL     LACTIC ACID, PLASMA     Status: Normal   Collection Time   08/04/11  3:00 PM      Component Value Range Comment   Lactic Acid, Venous 0.9  0.5 - 2.2 (mmol/L)   PROCALCITONIN     Status: Normal   Collection Time   08/04/11  3:00 PM      Component Value Range Comment   Procalcitonin 84.78     HEMOGLOBIN A1C     Status: Abnormal   Collection Time   08/04/11  3:00 PM      Component Value Range Comment   Hemoglobin A1C 6.0 (*) <5.7 (%)    Mean Plasma Glucose 126 (*) <117 (mg/dL)   CULTURE, BLOOD (ROUTINE X 2)     Status: Normal   Collection Time   08/04/11  3:55 PM      Component  Value Range Comment   Specimen Description BLOOD RIGHT HAND  5 ML IN [facility] BOTTLE      Special Requests Normal      Culture  Setup Time [id number]      Culture        Value: KLEBSIELLA PNEUMONIAE     Note: Confirmed Extended Spectrum Beta-Lactamase Producer (ESBL)     Note: Gram Stain Report Called to,Read Back By and Verified With: RN A. ARNOLD ON 08/05/11 AT 1630 BY TEDAR   Report Status 08/07/2011 FINAL      Organism ID, Bacteria KLEBSIELLA PNEUMONIAE     POCT I-STAT TROPONIN I     Status:  Normal   Collection Time   08/04/11  5:56 PM      Component Value Range Comment   Troponin i, poc 0.02  0.00 - 0.08 (ng/mL)    Comment 3            URINALYSIS, ROUTINE W REFLEX MICROSCOPIC     Status: Abnormal   Collection Time   08/04/11  8:24 PM      Component Value Range Comment   Color, Urine YELLOW  YELLOW     APPearance TURBID (*) CLEAR     Specific Gravity, Urine 1.019  1.005 - 1.030     pH 6.0  5.0 - 8.0     Glucose, UA NEGATIVE  NEGATIVE (mg/dL)    Hgb urine dipstick LARGE (*) NEGATIVE     Bilirubin Urine NEGATIVE  NEGATIVE     Ketones, ur NEGATIVE  NEGATIVE (mg/dL)    Protein, ur 30 (*) NEGATIVE (mg/dL)    Urobilinogen, UA 0.2  0.0 - 1.0 (mg/dL)    Nitrite POSITIVE (*) NEGATIVE     Leukocytes, UA LARGE (*) NEGATIVE    URINE CULTURE     Status: Normal   Collection Time   08/04/11  8:24 PM      Component Value Range Comment   Specimen Description URINE, CATHETERIZED      Special Requests Normal      Culture  Setup Time [id number]      Colony Count 75,000 COLONIES/ML      Culture        Value: Multiple bacterial morphotypes present, none predominant. Suggest appropriate recollection if clinically indicated.   Report Status 08/05/2011 FINAL     URINE MICROSCOPIC-ADD ON     Status: Abnormal   Collection Time   08/04/11  8:24 PM      Component Value Range Comment   WBC, UA TOO NUMEROUS TO COUNT  <3 (WBC/hpf)    RBC / HPF 21-50  <3 (RBC/hpf)    Bacteria, UA MANY (*) RARE     Casts GRANULAR CAST (*) NEGATIVE    GLUCOSE, CAPILLARY     Status: Abnormal   Collection Time   08/04/11  8:39 PM      Component Value Range Comment   Glucose-Capillary 192 (*) 70 - 99 (mg/dL)    Comment 1 Documented in Chart      Comment 2 Notify RN     MRSA PCR SCREENING     Status: Normal   Collection Time   08/04/11  9:48 PM      Component Value Range Comment   MRSA by PCR NEGATIVE  NEGATIVE    GLUCOSE, CAPILLARY     Status: Abnormal   Collection Time   08/05/11 12:04 AM      Component  Value Range Comment   Glucose-Capillary 161 (*) 70 -  99 (mg/dL)    Comment 1 Documented in Chart      Comment 2 Notify RN     BASIC METABOLIC PANEL     Status: Abnormal   Collection Time   08/05/11  3:35 AM      Component Value Range Comment   Sodium 134 (*) 135 - 145 (mEq/L)    Potassium 4.3  3.5 - 5.1 (mEq/L)    Chloride 101  96 - 112 (mEq/L)    CO2 24  19 - 32 (mEq/L)    Glucose, Bld 162 (*) 70 - 99 (mg/dL)    BUN 42 (*) 6 - 23 (mg/dL)    Creatinine, Ser 2.95 (*) 0.50 - 1.35 (mg/dL)    Calcium 8.8  8.4 - 10.5 (mg/dL)    GFR calc non Af Amer 19 (*) >90 (mL/min)    GFR calc Af Amer 22 (*) >90 (mL/min)   CBC     Status: Abnormal   Collection Time   08/05/11  3:35 AM      Component Value Range Comment   WBC 17.4 (*) 4.0 - 10.5 (K/uL)    RBC 3.04 (*) 4.22 - 5.81 (MIL/uL)    Hemoglobin 8.4 (*) 13.0 - 17.0 (g/dL)    HCT 62.1 (*) 30.8 - 52.0 (%)    MCV 88.8  78.0 - 100.0 (fL)    MCH 27.6  26.0 - 34.0 (pg)    MCHC 31.1  30.0 - 36.0 (g/dL)    RDW 65.7  84.6 - 96.2 (%)    Platelets 120 (*) 150 - 400 (K/uL)   GLUCOSE, CAPILLARY     Status: Abnormal   Collection Time   08/05/11  4:15 AM      Component Value Range Comment   Glucose-Capillary 153 (*) 70 - 99 (mg/dL)    Comment 1 Documented in Chart      Comment 2 Notify RN     GLUCOSE, CAPILLARY     Status: Abnormal   Collection Time   08/05/11  8:20 AM      Component Value Range Comment   Glucose-Capillary 151 (*) 70 - 99 (mg/dL)    Comment 1 Documented in Chart      Comment 2 Notify RN     GLUCOSE, CAPILLARY     Status: Abnormal   Collection Time   08/05/11 12:35 PM      Component Value Range Comment   Glucose-Capillary 149 (*) 70 - 99 (mg/dL)    Comment 1 Documented in Chart      Comment 2 Notify RN     GLUCOSE, CAPILLARY     Status: Abnormal   Collection Time   08/05/11  4:09 PM      Component Value Range Comment   Glucose-Capillary 166 (*) 70 - 99 (mg/dL)    Comment 1 Documented in Chart      Comment 2 Notify RN       GLUCOSE, CAPILLARY     Status: Abnormal   Collection Time   08/05/11  8:10 PM      Component Value Range Comment   Glucose-Capillary 127 (*) 70 - 99 (mg/dL)    Comment 1 Documented in Chart      Comment 2 Notify RN     GLUCOSE, CAPILLARY     Status: Abnormal   Collection Time   08/06/11 12:19 AM      Component Value Range Comment   Glucose-Capillary 146 (*) 70 - 99 (mg/dL)    Comment 1  Documented in Chart      Comment 2 Notify RN     GLUCOSE, CAPILLARY     Status: Abnormal   Collection Time   08/06/11  3:59 AM      Component Value Range Comment   Glucose-Capillary 163 (*) 70 - 99 (mg/dL)    Comment 1 Documented in Chart      Comment 2 Notify RN     GLUCOSE, CAPILLARY     Status: Abnormal   Collection Time   08/06/11  7:42 AM      Component Value Range Comment   Glucose-Capillary 116 (*) 70 - 99 (mg/dL)    Comment 1 Documented in Chart      Comment 2 Notify RN     BASIC METABOLIC PANEL     Status: Abnormal   Collection Time   08/06/11  8:25 AM      Component Value Range Comment   Sodium 138  135 - 145 (mEq/L)    Potassium 4.1  3.5 - 5.1 (mEq/L)    Chloride 104  96 - 112 (mEq/L)    CO2 26  19 - 32 (mEq/L)    Glucose, Bld 142 (*) 70 - 99 (mg/dL)    BUN 36 (*) 6 - 23 (mg/dL)    Creatinine, Ser 9.56 (*) 0.50 - 1.35 (mg/dL)    Calcium 9.0  8.4 - 10.5 (mg/dL)    GFR calc non Af Amer 19 (*) >90 (mL/min)    GFR calc Af Amer 23 (*) >90 (mL/min)   CBC     Status: Abnormal   Collection Time   08/06/11  8:25 AM      Component Value Range Comment   WBC 13.8 (*) 4.0 - 10.5 (K/uL)    RBC 3.05 (*) 4.22 - 5.81 (MIL/uL)    Hemoglobin 8.4 (*) 13.0 - 17.0 (g/dL)    HCT 21.3 (*) 08.6 - 52.0 (%)    MCV 88.2  78.0 - 100.0 (fL)    MCH 27.5  26.0 - 34.0 (pg)    MCHC 31.2  30.0 - 36.0 (g/dL)    RDW 57.8  46.9 - 62.9 (%)    Platelets 129 (*) 150 - 400 (K/uL)   GLUCOSE, CAPILLARY     Status: Abnormal   Collection Time   08/06/11 12:00 PM      Component Value Range Comment   Glucose-Capillary  165 (*) 70 - 99 (mg/dL)    Comment 1 Documented in Chart      Comment 2 Notify RN     GLUCOSE, CAPILLARY     Status: Abnormal   Collection Time   08/06/11  3:35 PM      Component Value Range Comment   Glucose-Capillary 136 (*) 70 - 99 (mg/dL)    Comment 1 Documented in Chart      Comment 2 Notify RN     GLUCOSE, CAPILLARY     Status: Abnormal   Collection Time   08/06/11  7:42 PM      Component Value Range Comment   Glucose-Capillary 101 (*) 70 - 99 (mg/dL)    Comment 1 Documented in Chart      Comment 2 Notify RN     GLUCOSE, CAPILLARY     Status: Abnormal   Collection Time   08/06/11 11:47 PM      Component Value Range Comment   Glucose-Capillary 116 (*) 70 - 99 (mg/dL)   GLUCOSE, CAPILLARY     Status: Abnormal   Collection  Time   08/07/11  4:13 AM      Component Value Range Comment   Glucose-Capillary 115 (*) 70 - 99 (mg/dL)   GLUCOSE, CAPILLARY     Status: Abnormal   Collection Time   08/07/11  7:38 AM      Component Value Range Comment   Glucose-Capillary 107 (*) 70 - 99 (mg/dL)    Comment 1 Documented in Chart      Comment 2 Notify RN      Recent Results (from the past 240 hour(s))  CULTURE, BLOOD (ROUTINE X 2)     Status: Normal   Collection Time   08/04/11  3:00 PM      Component Value Range Status Comment   Specimen Description BLOOD LEFT WRIST  5 ML IN [facility] BOTTLE   Final    Special Requests Normal   Final    Culture  Setup Time [id number]   Final    Culture     Final    Value: KLEBSIELLA PNEUMONIAE     Note: SUSCEPTIBILITIES PERFORMED ON PREVIOUS CULTURE WITHIN THE LAST 5 DAYS.     Note: Gram Stain Report Called to,Read Back By and Verified With: RN A. ARNOLD ON 08/05/11 AT 1630 BY TEDAR   Report Status 08/07/2011 FINAL   Final   CULTURE, BLOOD (ROUTINE X 2)     Status: Normal   Collection Time   08/04/11  3:55 PM      Component Value Range Status Comment   Specimen Description BLOOD RIGHT HAND  5 ML IN [facility] BOTTLE   Final    Special Requests Normal   Final      Culture  Setup Time [id number]   Final    Culture     Final    Value: KLEBSIELLA PNEUMONIAE     Note: Confirmed Extended Spectrum Beta-Lactamase Producer (ESBL)     Note: Gram Stain Report Called to,Read Back By and Verified With: RN A. ARNOLD ON 08/05/11 AT 1630 BY TEDAR   Report Status 08/07/2011 FINAL   Final    Organism ID, Bacteria KLEBSIELLA PNEUMONIAE   Final   URINE CULTURE     Status: Normal   Collection Time   08/04/11  8:24 PM      Component Value Range Status Comment   Specimen Description URINE, CATHETERIZED   Final    Special Requests Normal   Final    Culture  Setup Time [id number]   Final    Colony Count 75,000 COLONIES/ML   Final    Culture     Final    Value: Multiple bacterial morphotypes present, none predominant. Suggest appropriate recollection if clinically indicated.   Report Status 08/05/2011 FINAL   Final   MRSA PCR SCREENING     Status: Normal   Collection Time   08/04/11  9:48 PM      Component Value Range Status Comment   MRSA by PCR NEGATIVE  NEGATIVE  Final    Creatinine:  Basename 08/06/11 0825 08/05/11 0335 08/04/11 1500  CREATININE 2.97* 3.08* 3.09*   Baseline Creatinine: 1.6  Impression/Assessment:  Possible L upper ureteral stone from his u/s report, vs his KUB on admission. Needs CT-sone protocol. On Primaxin. Possible Surgery for L JJ stent. Will ck  CT.   Plan: Stat Cr/gfr CT stone protocol ( no contrast) Call results  Sherryll Skoczylas I 08/07/2011, 12:38 PM

## 2011-08-07 NOTE — Progress Notes (Signed)
Subjective: Having low grade fevers  Objective: Vital signs in last 24 hours: Filed Vitals:   08/07/11 0500 08/07/11 0600 08/07/11 0700 08/07/11 0800  BP: 135/57 128/71 141/61 152/73  Pulse: 72 72 71 90  Temp:    98.5 F (36.9 C)  TempSrc:    Oral  Resp: 16 16 17 20   Height:      Weight:      SpO2: 100% 100% 99% 99%    Intake/Output Summary (Last 24 hours) at 08/07/11 0857 Last data filed at 08/07/11 0800  Gross per 24 hour  Intake   3880 ml  Output   1350 ml  Net   2530 ml    Weight change:   PSYCH: He is alert and oriented x2; does appear anxious  HEENT: Normocephalic and Atraumatic, Mucous membranes pink; PERRLA; EOM intact; Fundi: Benign; No scleral icterus, Nares: Patent, Oropharynx: Clear, Edentulous, Neck: FROM, no cervical lymphadenopathy nor thyromegaly or carotid bruit; no JVD;  Breasts:: Not examined  CHEST WALL: No tenderness  CHEST: Normal respiration, clear to auscultation bilaterally  HEART: Regular rate and rhythm; no murmurs rubs or gallops  BACK: No kyphosis or scoliosis; no CVA tenderness  ABDOMEN: Positive Bowel Sounds, Distended, tympanic in all 4 quadrants, Obese, soft non-tender; no masses, no organomegaly.  Rectal Exam: Not done  EXTREMITIES: No bone or joint deformity; age-appropriate arthropathy of the hands and knees; no cyanosis, clubbing or edema; no ulcerations.  Genitalia: not examined  PULSES: 2+ and symmetric  SKIN: Normal hydration no rash or ulceration  CNS: Chronic Expressive Aphasia, and Right Hemiplegia and Hemiparesis.       Lab Results: Results for orders placed during the hospital encounter of 08/04/11 (from the past 24 hour(s))  GLUCOSE, CAPILLARY     Status: Abnormal   Collection Time   08/06/11 12:00 PM      Component Value Range   Glucose-Capillary 165 (*) 70 - 99 (mg/dL)   Comment 1 Documented in Chart     Comment 2 Notify RN    GLUCOSE, CAPILLARY     Status: Abnormal   Collection Time   08/06/11  3:35 PM   Component Value Range   Glucose-Capillary 136 (*) 70 - 99 (mg/dL)   Comment 1 Documented in Chart     Comment 2 Notify RN    GLUCOSE, CAPILLARY     Status: Abnormal   Collection Time   08/06/11  7:42 PM      Component Value Range   Glucose-Capillary 101 (*) 70 - 99 (mg/dL)   Comment 1 Documented in Chart     Comment 2 Notify RN    GLUCOSE, CAPILLARY     Status: Abnormal   Collection Time   08/06/11 11:47 PM      Component Value Range   Glucose-Capillary 116 (*) 70 - 99 (mg/dL)  GLUCOSE, CAPILLARY     Status: Abnormal   Collection Time   08/07/11  4:13 AM      Component Value Range   Glucose-Capillary 115 (*) 70 - 99 (mg/dL)  GLUCOSE, CAPILLARY     Status: Abnormal   Collection Time   08/07/11  7:38 AM      Component Value Range   Glucose-Capillary 107 (*) 70 - 99 (mg/dL)   Comment 1 Documented in Chart     Comment 2 Notify RN      Micro: Recent Results (from the past 240 hour(s))  CULTURE, BLOOD (ROUTINE X 2)     Status: Normal (Preliminary  result)   Collection Time   08/04/11  3:00 PM      Component Value Range Status Comment   Specimen Description BLOOD LEFT WRIST  5 ML IN [facility] BOTTLE   Final    Special Requests Normal   Final    Culture  Setup Time [id number]   Final    Culture     Final    Value: GRAM NEGATIVE RODS     Note: Gram Stain Report Called to,Read Back By and Verified With: RN A. ARNOLD ON 08/05/11 AT 1630 BY TEDAR   Report Status PENDING   Incomplete   CULTURE, BLOOD (ROUTINE X 2)     Status: Normal (Preliminary result)   Collection Time   08/04/11  3:55 PM      Component Value Range Status Comment   Specimen Description BLOOD RIGHT HAND  5 ML IN [facility] BOTTLE   Final    Special Requests Normal   Final    Culture  Setup Time [id number]   Final    Culture     Final    Value: GRAM NEGATIVE RODS     Note: Gram Stain Report Called to,Read Back By and Verified With: RN A. ARNOLD ON 08/05/11 AT 1630 BY TEDAR   Report Status PENDING   Incomplete   URINE CULTURE      Status: Normal   Collection Time   08/04/11  8:24 PM      Component Value Range Status Comment   Specimen Description URINE, CATHETERIZED   Final    Special Requests Normal   Final    Culture  Setup Time [id number]   Final    Colony Count 75,000 COLONIES/ML   Final    Culture     Final    Value: Multiple bacterial morphotypes present, none predominant. Suggest appropriate recollection if clinically indicated.   Report Status 08/05/2011 FINAL   Final   MRSA PCR SCREENING     Status: Normal   Collection Time   08/04/11  9:48 PM      Component Value Range Status Comment   MRSA by PCR NEGATIVE  NEGATIVE  Final     Studies/Results: US Renal Port  08/06/2011  *RADIOLOGY REPORT*  Clinical Data: Hydronephrosis, renal failure.  RENAL/URINARY TRACT ULTRASOUND COMPLETE  Comparison:  CT 09/19/2010  Findings:  Right Kidney:  10.6 cm.  Lower pole not well visualized due to overlying bowel gas.  No hydronephrosis.  Left Kidney:  13.6 cm.  There is mild left hydronephrosis. Elongated echogenic area noted within the proximal left ureter just below the ureteropelvic junction, presumably obstructing stone. 4.6 cm exophytic cyst off the upper pole.  Bladder:  Foley catheter in place.  The bladder is decompressed.  IMPRESSION: Mild left hydronephrosis.  There appears to be an obstructing proximal left ureteral stone.  Original Report Authenticated By: Cyndie Chime, M.D.    Medications:  Scheduled Meds:   . ipratropium  0.5 mg Nebulization Q4H   And  . albuterol  2.5 mg Nebulization Q4H  . antiseptic oral rinse  15 mL Mouth Rinse q12n4p  . chlorhexidine  15 mL Mouth Rinse BID  . enoxaparin  30 mg Subcutaneous Q24H  . famotidine (PEPCID) IV  20 mg Intravenous Q12H  . imipenem-cilastatin  250 mg Intravenous Q6H  . imipenem-cilastatin  250 mg Intravenous STAT  . insulin aspart  0-9 Units Subcutaneous Q4H  . sodium chloride  1,000 mL Intravenous Once  . sodium chloride  3 mL  Intravenous Q12H  .  vancomycin  750 mg Intravenous Q24H   Continuous Infusions:   . sodium chloride 1,000 mL (08/07/11 0134)   PRN Meds:.acetaminophen, acetaminophen, alum & mag hydroxide-simeth, HYDROmorphone, ondansetron (ZOFRAN) IV, ondansetron, oxyCODONE, zolpidem   Assessment: Principal Problem:  *Sepsis Active Problems:  Obstructive chronic bronchitis with exacerbation  Type II or unspecified type diabetes mellitus without mention of complication, not stated as uncontrolled  Dementia  ARF (acute renal failure)  Urinary retention  Fever  Leukocytosis  Flaccid hemiplegia affecting dominant side  Urethral stricture   Plan:  #1 sepsis secondary to UTI , history of urethral stricture, status post placement of a Foley catheter, status post dilatation of a urethral stricture on the 27th by urology,  follow cultures,recent history of Proteus mirabilis bacteriemia , also had Klebsiella pneumonia during his  recent admission that was resistant to ampicillin, discontinued  Zosyn yesterday and started imipenem    #2 COPD without exacerbation, continue as needed DuoNeb treatments   #3 acute kidney injury with Chronic kidney disease stage III with a history of obstructive uropathy and also urethral strictures. Baseline creatinine is 1.6, this is likely prerenal, continue IV fluids , renal ultrasound yesterday showed obstructing proximal left ureteral stone.Will ask urology to come back and evaluate . May need to go to the OR .   #4 diabetes mellitus type 2 continue sliding scale insulin  #5 history of CVA with residue weakness  #6 disposition likely DC to SNF 3 days      LOS: 3 days   [facility] 08/07/2011, 8:57 AM

## 2011-08-07 NOTE — Op Note (Signed)
Pre-operative diagnosis : bilateral ureteral obstruction with urosepsis  Postoperative diagnosis: same  Operation: Cystoscopy, bilateral retrograde pyelogram with interpretation, bilateral JJ stents.   Surgeon:  Kathie Rhodes. Patsi Sears, MD  First assistant:none  Anesthesia:  GET  Preparation: After appropriate premedication, the patient was brought to the operating room placed in the upper table in the dorsal supine position where general endotracheal anesthesia was introduced. Armband was rechecked. He was placed in the dorsal lithotomy position and the pubis was prepped with Betadine solution and draped in usual fashion. The legs were very stiff, and did not rotate externally.  Review history: The patient is a 71 year old male admitted with urosepsis, and found to have bilateral upper ureteral calculi by CT scan. He is now for double-J stent placement bilaterally. The case was discussed with the patient's son, and daughter-in-law  postoperatively.  Statement of  Likelihood of Success: Excellent. TIME-OUT observed.:  Procedure: Cystourethroscopy was accomplished and showed an uncircumcised penis, with with distal urethral stricture, which had been dilated. The cystoscope was placed in the bladder, and the bladder appeared to be hemorrhagic Maisie Fus and there was no evidence of bladder tumor. He was done granular material within the bladder, however. The left orifice was identified, cannulated, electrically PolyGram was performed which showed hydronephrosis. I did not see the upper ureteral calculus well, which was seen on CT scan. Guidewire was placed into the renal pelvis, and a 6 French by 26 cm double-J stent was placed in the renal pelvis. A large amount of pus was collected in the bladder after placement of the double-J stent. Right retrograde PolyGram was performed, which was difficult. Therefore the short ureteroscope was placed in the right ureteral orifice, and the true ureter identified. A guidewire  was passed in the renal pelvis retrograde PolyGram was performed. Retropyelogram showed no definite stone in the right upper ureter, but the patient a tortuous ureter. Guidewire was placed in the renal pelvis, and again, a 6 [place] by 26 cm catheter was placed. Fluoroscopic control showed that the double-J stent were in excellent position. The patient had a B. and O. suppository. He was awakened and taken to recovery room in good condition. I have is that the patient's daughter-in-law (guardian), and note that he is intubated, and will try for extubation in recovery room.

## 2011-08-07 NOTE — Transfer of Care (Signed)
Immediate Anesthesia Transfer of Care Note  Patient: Justin Sherman  Procedure(s) Performed: Procedure(s) (LRB): CYSTOSCOPY WITH RETROGRADE PYELOGRAM/URETERAL STENT PLACEMENT (Bilateral)  Patient Location: PACU  Anesthesia Type: General  Level of Consciousness: sedated, patient cooperative and responds to stimulaton  Airway & Oxygen Therapy: Patient Spontanous Breathing and Patient connected to face mask oxgen  Post-op Assessment: Report given to PACU RN and Post -op Vital signs reviewed and stable  Post vital signs: Reviewed and stable  Complications: No apparent anesthesia complications

## 2011-08-07 NOTE — Anesthesia Postprocedure Evaluation (Signed)
  Anesthesia Post-op Note  Patient: Justin Sherman  Procedure(s) Performed: Procedure(s) (LRB): CYSTOSCOPY WITH RETROGRADE PYELOGRAM/URETERAL STENT PLACEMENT (Bilateral)  Patient Location: PACU  Anesthesia Type: General  Level of Consciousness: oriented and sedated  Airway and Oxygen Therapy: Patient Spontanous Breathing and Patient connected to nasal cannula oxygen  Post-op Pain: mild  Post-op Assessment: Post-op Vital signs reviewed, Patient's Cardiovascular Status Stable, Respiratory Function Stable and Patent Airway  Post-op Vital Signs: stable  Complications: No apparent anesthesia complications

## 2011-08-07 NOTE — Anesthesia Preprocedure Evaluation (Addendum)
Anesthesia Evaluation  Patient identified by MRN, date of birth, ID band Patient awake  General Assessment Comment:Ate today Poor historian  Reviewed: Allergy & Precautions, H&P , NPO status , Patient's Chart, lab work & pertinent test results, reviewed documented beta blocker date and time   Airway Mallampati: II TM Distance: >3 FB Neck ROM: Full    Dental  (+) Dental Advisory Given and Poor Dentition   Pulmonary neg pulmonary ROS, COPD COPD inhaler,  breath sounds clear to auscultation  + decreased breath sounds      Cardiovascular hypertension, Pt. on medications negative cardio ROS  Rhythm:Regular Rate:Normal     Neuro/Psych dementia CVA, Residual Symptoms negative psych ROS   GI/Hepatic negative GI ROS, Neg liver ROS,   Endo/Other  Diabetes mellitus-, Type 2  Renal/GU Bilateral ureteral obstruction/hydronephrosis Cr 2.6 ? Working on urosepsis  negative genitourinary   Musculoskeletal negative musculoskeletal ROS (+)   Abdominal   Peds negative pediatric ROS (+)  Hematology Anemia, Hgb 8.4   Anesthesia Other Findings   Reproductive/Obstetrics negative OB ROS                          Anesthesia Physical Anesthesia Plan  ASA: III and Emergent  Anesthesia Plan: General   Post-op Pain Management:    Induction: Intravenous, Rapid sequence and Cricoid pressure planned  Airway Management Planned:   Additional Equipment:   Intra-op Plan:   Post-operative Plan: Extubation in OR  Informed Consent: I have reviewed the patients History and Physical, chart, labs and discussed the procedure including the risks, benefits and alternatives for the proposed anesthesia with the patient or authorized representative who has indicated his/her understanding and acceptance.   Dental advisory given  Plan Discussed with: CRNA and Surgeon  Anesthesia Plan Comments:         Anesthesia Quick  Evaluation

## 2011-08-07 NOTE — Progress Notes (Signed)
Telephone call to Dr Marcello Fennel, states that the floor is to call [facility] for new orders for pt

## 2011-08-08 ENCOUNTER — Encounter (HOSPITAL_COMMUNITY): Payer: Self-pay | Admitting: Urology

## 2011-08-08 DIAGNOSIS — R7881 Bacteremia: Secondary | ICD-10-CM

## 2011-08-08 DIAGNOSIS — N179 Acute kidney failure, unspecified: Secondary | ICD-10-CM

## 2011-08-08 DIAGNOSIS — R339 Retention of urine, unspecified: Secondary | ICD-10-CM

## 2011-08-08 LAB — GLUCOSE, CAPILLARY
Glucose-Capillary: 114 mg/dL — ABNORMAL HIGH (ref 70–99)
Glucose-Capillary: 115 mg/dL — ABNORMAL HIGH (ref 70–99)

## 2011-08-08 LAB — BASIC METABOLIC PANEL
CO2: 20 mEq/L (ref 19–32)
Calcium: 8.7 mg/dL (ref 8.4–10.5)
Creatinine, Ser: 2.7 mg/dL — ABNORMAL HIGH (ref 0.50–1.35)
GFR calc Af Amer: 25 mL/min — ABNORMAL LOW (ref >90)
GFR calc non Af Amer: 22 mL/min — ABNORMAL LOW (ref >90)
Sodium: 137 mEq/L (ref 135–145)

## 2011-08-08 MED ORDER — FLEET ENEMA 7-19 GM/118ML RE ENEM
1.0000 | ENEMA | Freq: Once | RECTAL | Status: AC
  Administered 2011-08-08: 1 via RECTAL
  Filled 2011-08-08: qty 1

## 2011-08-08 MED ORDER — FAMOTIDINE IN NACL 20-0.9 MG/50ML-% IV SOLN
20.0000 mg | INTRAVENOUS | Status: DC
  Administered 2011-08-08: 20 mg via INTRAVENOUS
  Filled 2011-08-08 (×2): qty 50

## 2011-08-08 NOTE — Progress Notes (Signed)
CSW continues to follow for return to SNF. Pt from [facility] and plan is to return. Family did bed hold at facility and facility will accept back. FL2 on chart for signatures.  CSW will continue to follow for SNF return.  Vennie Homans, [place] 08/08/2011 9:36 AM [phone]

## 2011-08-08 NOTE — Progress Notes (Signed)
Speech Language Pathology Dysphagia Treatment Patient Details Name: Justin Sherman MRN: [id number] DOB: May 26, 1940 Today's Date: 08/08/2011 Time: 4098-1191 SLP Time Calculation (min): 13 min  Assessment / Plan / Recommendation Clinical Impression  Pt observed consuming water today, again swallow was timely with clear voice throughout.  Pt appeared with wheeze and mildly increased work of breathing with minimal intake.  Pt sleepy today..falling asleep during session.  SlP reviewed results of MBS from April 2012 with pt, mild oropharyngeal dysphagia with slow bolus formation and transit of solids, as well as appearance of osteophytes decreasing UES opening.  Recommend continue diet with strict aspiration precautions, allowing rest breaks if pt is dyspneic or coughing with intake.  Intake documented as 75% on 4/30, occ wheeze, decr congested cough.  No further slp needed.  Please reorder if desire.  RN reports tolerance of po.    Diet Recommendation    Continue as MD ordered.    SLP Plan All goals met   ng Goals  SLP Swallowing Goals Swallow Study Goal #1 - Progress: Met Swallow Study Goal #2 - Progress: Other (comment) (due to pt's dementia, supervision indicated)  General Temperature Spikes Noted: No Respiratory Status: Supplemental O2 delivered via (comment) Behavior/Cognition: Alert;Cooperative;Pleasant mood;Requires cueing Oral Cavity - Dentition: Adequate natural dentition Patient Positioning: Upright in bed   Dysphagia Treatment Treatment focused on: Patient/family/caregiver education;Skilled observation of diet tolerance Family/Caregiver Educated: pt Treatment Methods/Modalities: Skilled observation Patient observed directly with PO's: Yes Type of PO's observed: Thin liquids Feeding: Needs set up Liquids provided via: Melchor Amour, MS [facility] SLP [phone]

## 2011-08-08 NOTE — Progress Notes (Signed)
Large, soft brown movement produced after fleets enema.  Abdomen softer.

## 2011-08-08 NOTE — Progress Notes (Signed)
Urology Progress Note  1 Day Post-Op: post Bilateral JJ stents: Acute Renal Failure,  Bilateral ureteral stones.  Subjective:     No acute urologic events overnight. Ambulation:   negative Flatus:    negative Bowel movement  negative  Pain: some relief  Objective:  Blood pressure 132/56, pulse 95, temperature 99.8 F (37.7 C), temperature source Oral, resp. rate 18, height 5\' 10"  (1.778 m), weight 83.6 kg (184 lb 4.9 oz), SpO2 99.00%.  Physical Exam:  General:  No acute distress, awake Resp: clear to auscultation bilaterally Genitourinary:  Foley in place Foley: yes    I/O last 3 completed shifts: In: 6555 [P.O.:480; I.V.:5375; IV Piggyback:700] Out: 2300 [Urine:2300]  Recent Labs  [facility] 08/06/11 0825   HGB 8.4*   WBC 13.8*   PLT 129*    Recent Labs  [facility] 08/08/11 0335 08/07/11 1430   NA 137 139   K 4.0 4.2   CL 108 107   CO2 20 24   BUN 34* 33*   CREATININE 2.70* 2.60*   CALCIUM 8.7 8.8   GFRNONAA 22* 23*   GFRAA 25* 26*     No results found for this basename: PT:2,INR:2,APTT:2 in the last 72 hours   No components found with this basename: ABG:2  Assessment/Plan: Cr. Still elevated. WBC still elevated. Pus drained from L kidney. R upper ureteral stone seen by Radiologist, but not ween on retrograde pyelogram.   Catheter not removed. Will be eventually ok for condom cath when acute renal failure resolves. Will notify Dr. Lynnae Sandhoff ( Urology).

## 2011-08-08 NOTE — Progress Notes (Signed)
Subjective: Patient has an expressive aphasia but is able to answer most questions appropriately. He is nontoxic-appearing and indicates that he's feeling better. The patient has not had a bowel movement in the past several days. He does admit to passing flatus.  Objective: Filed Vitals:   08/08/11 0400 08/08/11 0800 08/08/11 0837 08/08/11 0845  BP: 132/56   136/60  Pulse:    79  Temp: 97.6 F (36.4 C) 99.8 F (37.7 C)    TempSrc: Oral Oral    Resp: 18   22  Height:      Weight:      SpO2: 99%  100% 100%   Weight change: 1.7 kg (3 lb 12 oz)  Intake/Output Summary (Last 24 hours) at 08/08/11 0937 Last data filed at 08/08/11 0600  Gross per 24 hour  Intake   4100 ml  Output   1650 ml  Net   2450 ml    General: Alert, awake, oriented x3, in no acute distress.  HEENT: [place]/AT PEERL, EOMI Neck: Trachea midline,  no masses, no thyromegal,y no JVD, no carotid bruit OROPHARYNX:  Moist, No exudate/ erythema/lesions.  Heart: Regular rate and rhythm, without murmurs, rubs, gallops, PMI non-displaced, no heaves or thrills on palpation.  Lungs: Clear to auscultation, no wheezing or rhonchi noted. No increased vocal fremitus resonant to percussion  Abdomen: Mildly distended, positive bowel sounds, no masses no hepatosplenomegaly noted..  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 Otherwise no focal neurological deficits noted cranial nerves II through XII grossly intact.  Musculoskeletal: No warm swelling or erythema around joints, no spinal tenderness noted. Psychiatric: Patient alert and oriented x3, good insight and cognition.   Lab Results:  Basename 08/08/11 0335 08/07/11 1430  NA 137 139  K 4.0 4.2  CL 108 107  CO2 20 24  GLUCOSE 115* 136*  BUN 34* 33*  CREATININE 2.70* 2.60*  CALCIUM 8.7 8.8  MG -- --  PHOS -- --   No results found for this basename: AST:2,ALT:2,ALKPHOS:2,BILITOT:2,PROT:2,ALBUMIN:2 in the last 72 hours No results found for  this basename: LIPASE:2,AMYLASE:2 in the last 72 hours  Basename 08/06/11 0825  WBC 13.8*  NEUTROABS --  HGB 8.4*  HCT 26.9*  MCV 88.2  PLT 129*   No results found for this basename: CKTOTAL:3,CKMB:3,CKMBINDEX:3,TROPONINI:3 in the last 72 hours No components found with this basename: POCBNP:3 No results found for this basename: DDIMER:2 in the last 72 hours No results found for this basename: HGBA1C:2 in the last 72 hours No results found for this basename: CHOL:2,HDL:2,LDLCALC:2,TRIG:2,CHOLHDL:2,LDLDIRECT:2 in the last 72 hours No results found for this basename: TSH,T4TOTAL,FREET3,T3FREE,THYROIDAB in the last 72 hours No results found for this basename: VITAMINB12:2,FOLATE:2,FERRITIN:2,TIBC:2,IRON:2,RETICCTPCT:2 in the last 72 hours  Micro Results: Recent Results (from the past 240 hour(s))  CULTURE, BLOOD (ROUTINE X 2)     Status: Normal   Collection Time   08/04/11  3:00 PM      Component Value Range Status Comment   Specimen Description BLOOD LEFT WRIST  5 ML IN [facility] BOTTLE   Final    Special Requests Normal   Final    Culture  Setup Time [id number]   Final    Culture     Final    Value: KLEBSIELLA PNEUMONIAE     Note: SUSCEPTIBILITIES PERFORMED ON PREVIOUS CULTURE WITHIN THE LAST 5 DAYS.     Note: Gram Stain Report Called to,Read Back By and Verified With: RN A. ARNOLD ON 08/05/11 AT 1630 BY Theodoro Doing  Report Status 08/07/2011 FINAL   Final   CULTURE, BLOOD (ROUTINE X 2)     Status: Normal   Collection Time   08/04/11  3:55 PM      Component Value Range Status Comment   Specimen Description BLOOD RIGHT HAND  5 ML IN [facility] BOTTLE   Final    Special Requests Normal   Final    Culture  Setup Time [id number]   Final    Culture     Final    Value: KLEBSIELLA PNEUMONIAE     Note: Confirmed Extended Spectrum Beta-Lactamase Producer (ESBL)     Note: Gram Stain Report Called to,Read Back By and Verified With: RN A. ARNOLD ON 08/05/11 AT 1630 BY TEDAR   Report Status 08/07/2011  FINAL   Final    Organism ID, Bacteria KLEBSIELLA PNEUMONIAE   Final   URINE CULTURE     Status: Normal   Collection Time   08/04/11  8:24 PM      Component Value Range Status Comment   Specimen Description URINE, CATHETERIZED   Final    Special Requests Normal   Final    Culture  Setup Time [id number]   Final    Colony Count 75,000 COLONIES/ML   Final    Culture     Final    Value: Multiple bacterial morphotypes present, none predominant. Suggest appropriate recollection if clinically indicated.   Report Status 08/05/2011 FINAL   Final   MRSA PCR SCREENING     Status: Normal   Collection Time   08/04/11  9:48 PM      Component Value Range Status Comment   MRSA by PCR NEGATIVE  NEGATIVE  Final     Studies/Results: Ct Abdomen Pelvis Wo Contrast  08/07/2011  *RADIOLOGY REPORT*  Clinical Data: Possible left ureteral stone with left hydronephrosis and acute renal failure  CT ABDOMEN AND PELVIS WITHOUT CONTRAST  Technique:  Multidetector CT imaging of the abdomen and pelvis was performed following the standard protocol without intravenous contrast.  Comparison: 09/19/2010  Findings: Lung bases shows small left pleural effusion with left lower lobe posterior atelectasis or infiltrate.  Trace right pleural effusion with right basilar atelectasis.  Sagittal images of the spine shows osteopenia and degenerative changes thoracolumbar spine.  Unenhanced liver is unremarkable.  Unenhanced spleen, pancreas and adrenal glands are unremarkable.  There is a nonobstructive calcified calculus mid pole of the right kidney measures 11 x 8.7 mm.  There is mild right perinephric stranding.  In axial image 47 there is 5 mm and 9 mm calcified calculus in the proximal right ureter at L3-L4 disc space.  There is mild left hydronephrosis.  Multiple calcified calculi are noted in the upper pole of the left kidney the largest measures 9.7 mm.  Calcified calculus in mid pole of the left kidney measures 4.7 mm.  In axial  image 43 there is a 10.4 by 8 mm calcified obstructive calculus in proximal left ureter at level of L3 vertebral body. There is  left perinephric stranding.  No calcified gallstones are noted within gallbladder. Atherosclerotic calcifications are noted abdominal aorta and the iliac arteries.  Bilateral distal ureter is unremarkable.  There is a Foley catheter in a decompressed urinary bladder.  Moderate stool noted within rectum.  The rectum is distended measures 7.6 cm in diameter suspicious for fecal impaction.  No small bowel obstruction.  No pericecal inflammation.  No destructive bony lesions are noted within pelvis. 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  In upper pole of the left kidney there is a cyst measures 4.3 x 4 cm.  IMPRESSION:  1.  Bilateral pleural effusions left greater than right with bilateral basilar posterior atelectasis. 2.  Bilateral nephrolithiasis.  Largest nonobstructive calcified calculus in mid pole of the left kidney measures 11 x 8.7 mm. Largest calculus in upper pole of the left kidney measures 9.7 mm. 3.  There is a calcified calculus in proximal right ureter measures 5 x  9 mm. Mild left perinephric stranding. 4.  There is a obstructive calcified calculus in proximal left ureter measures 10.4 x 8 mm.  Mild left hydronephrosis and mild left perinephric stranding. 5.  Atherosclerotic calcifications of the abdominal aorta and the iliac arteries.  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 6. Significant stool noted within rectum which is distended measures at least 7.6 cm in diameter suspicious for fecal impaction. 7.  There is a Foley catheter in a decompressed urinary bladder.  Original Report Authenticated By: Natasha Mead, M.D.   Ct Head Wo Contrast  08/04/2011  *RADIOLOGY REPORT*  Clinical Data: Dementia.  Stroke.  Mental status changes.  CT HEAD WITHOUT CONTRAST  Technique:  Contiguous axial images were obtained from  the base of the skull through the vertex without contrast.  Comparison: 07/11/2010  Findings: The brain shows generalized atrophy.  There is a old infarction in the left middle cerebral artery territory which has progressed atrophy encephalomalacia, similar to the previous study. No sign of acute infarction, mass lesion, hemorrhage, hydrocephalus or extra-axial collection.  The calvarium is unremarkable.  There are mucosal inflammatory changes of the maxillary and ethmoid sinuses.  IMPRESSION: No acute intracranial finding.  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  Some mucosal inflammation of the paranasal sinuses.  Original Report Authenticated By: Thomasenia Sales, M.D.   US Renal Port  08/06/2011  *RADIOLOGY REPORT*  Clinical Data: Hydronephrosis, renal failure.  RENAL/URINARY TRACT ULTRASOUND COMPLETE  Comparison:  CT 09/19/2010  Findings:  Right Kidney:  10.6 cm.  Lower pole not well visualized due to overlying bowel gas.  No hydronephrosis.  Left Kidney:  13.6 cm.  There is mild left hydronephrosis. Elongated echogenic area noted within the proximal left ureter just below the ureteropelvic junction, presumably obstructing stone. 4.6 cm exophytic cyst off the upper pole.  Bladder:  Foley catheter in place.  The bladder is decompressed.  IMPRESSION: Mild left hydronephrosis.  There appears to be an obstructing proximal left ureteral stone.  Original Report Authenticated By: Cyndie Chime, M.D.   Dg Chest Portable 1 View  08/04/2011  *RADIOLOGY REPORT*  Clinical Data: Shortness of breath.  Diabetes.  Chronic bronchitis.  PORTABLE CHEST - 1 VIEW  Comparison: 09/25/2010.  Findings: Normal sized heart.  Clear lungs.  Mild osteopenia and bilateral shoulder degenerative changes.  Thoracic spine degenerative changes and mild scoliosis.  IMPRESSION: No acute abnormality.  Original Report Authenticated By: Darrol Angel, M.D.   Dg Abd 2 Views  08/04/2011  *RADIOLOGY REPORT*  Clinical Data: Abdominal pain.  ABDOMEN -  2 VIEW  Comparison: Portable chest obtained earlier today.  Findings: Mildly dilated small bowel loops in the right mid to upper abdomen.  Mildly prominent colon filled with gas and stool. Prominent stool in the rectum.  No free peritoneal air.  Lumbar and lower thoracic spine degenerative changes.  Faintly visualized left renal calculi, better seen on the CT dated 09/08/2010.  Previously  demonstrated right renal calculi are obscured by the bowel.  Right inferior pelvic phlebolith.  IMPRESSION:  1.  Mild small bowel ileus or partial obstruction. 2.  Mildly prominent stool in the rectum. 3.  Left renal calculi.  Original Report Authenticated By: Darrol Angel, M.D.    Medications: I have reviewed the patient's current medications. Scheduled Meds:   . ipratropium  0.5 mg Nebulization Q4H   And  . albuterol  2.5 mg Nebulization Q4H  . antiseptic oral rinse  15 mL Mouth Rinse q12n4p  . chlorhexidine  15 mL Mouth Rinse BID  . enoxaparin  30 mg Subcutaneous Q24H  . famotidine (PEPCID) IV  20 mg Intravenous Q12H  . HYDROmorphone      . imipenem-cilastatin  250 mg Intravenous Q6H  . insulin aspart  0-9 Units Subcutaneous Q4H  . sodium chloride  1,000 mL Intravenous Once  . sodium chloride  3 mL Intravenous Q12H  . vancomycin  750 mg Intravenous Q24H   Continuous Infusions:   . sodium chloride 1,000 mL (08/07/11 0918)  . DISCONTD: sodium chloride 125 mL/hr at 08/07/11 1908   PRN Meds:.acetaminophen, acetaminophen, alum & mag hydroxide-simeth, HYDROmorphone, ondansetron (ZOFRAN) IV, ondansetron, oxyCODONE, zolpidem, DISCONTD: HYDROmorphone, DISCONTD: Omnipaque 300 mg/mL (10 ml)in 0.9 % normal saline (10 mL), DISCONTD: opium-belladonna, DISCONTD: sterile water for irrigation Assessment/Plan: Patient Active Hospital Problem List: Sepsis (08/04/2011)   Assessment: Sepsis physiology resolved. PT now has stable BP and HR and appears non-toxic. Pt presently on Primaxin day#3 ad Vancomycin Day#4. Urine  cultures negative from this hospitalization, however BC showed Klebsiella in 2/2 bottles which is sensitive to Imipenem and Zosyn.   Plan: Will continue Imipenem and D/C vancomycin  Obstructive chronic bronchitis with exacerbation ()   Assessment: Quiescent    Type II or unspecified type diabetes mellitus without mention of complication, not stated as uncontrolled ()   Assessment:  Blood sugars adequately controlled   Plan: Continue SSI  Acute renal failure) (08/04/2011)   Assessment: Pt has a baseline Cr of 1.4. Presently at 2.7   Plan: Continue Hydration. D/C Vancomycin as could be adding to Nephrotoxicity  Urinary retention (08/04/2011)   Assessment: Will defer to Urology   Fever (08/04/2011)   Assessment: Pt has been afebrile for last 12 hours    Leukocytosis (08/04/2011)   Assessment: Will check WBC tomorrow.    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    Plan: Defer to Urology  Fecal Impaction (08/08/2011)   Assessment: Will ask nursing to manually disimpact. May be contributing to elevated Cr.  Transfer to non-tele bed.   LOS: 4 days

## 2011-08-09 ENCOUNTER — Encounter (HOSPITAL_COMMUNITY): Payer: Self-pay | Admitting: *Deleted

## 2011-08-09 DIAGNOSIS — N179 Acute kidney failure, unspecified: Secondary | ICD-10-CM

## 2011-08-09 DIAGNOSIS — R7881 Bacteremia: Secondary | ICD-10-CM

## 2011-08-09 DIAGNOSIS — R339 Retention of urine, unspecified: Secondary | ICD-10-CM

## 2011-08-09 LAB — BASIC METABOLIC PANEL
BUN: 31 mg/dL — ABNORMAL HIGH (ref 6–23)
CO2: 22 mEq/L (ref 19–32)
Chloride: 107 mEq/L (ref 96–112)
Creatinine, Ser: 2.41 mg/dL — ABNORMAL HIGH (ref 0.50–1.35)

## 2011-08-09 LAB — CBC
HCT: 22.4 % — ABNORMAL LOW (ref 39.0–52.0)
MCV: 86.5 fL (ref 78.0–100.0)
RBC: 2.59 MIL/uL — ABNORMAL LOW (ref 4.22–5.81)
RDW: 15.2 % (ref 11.5–15.5)
WBC: 13.5 10*3/uL — ABNORMAL HIGH (ref 4.0–10.5)

## 2011-08-09 LAB — GLUCOSE, CAPILLARY
Glucose-Capillary: 108 mg/dL — ABNORMAL HIGH (ref 70–99)
Glucose-Capillary: 124 mg/dL — ABNORMAL HIGH (ref 70–99)
Glucose-Capillary: 90 mg/dL (ref 70–99)
Glucose-Capillary: 76 mg/dL (ref 70–99)

## 2011-08-09 LAB — DIFFERENTIAL
Eosinophils Relative: 4 % (ref 0–5)
Lymphs Abs: 1.6 10*3/uL (ref 0.7–4.0)
Monocytes Relative: 7 % (ref 3–12)
Neutro Abs: 10.4 10*3/uL — ABNORMAL HIGH (ref 1.7–7.7)

## 2011-08-09 MED ORDER — ASPIRIN 81 MG PO CHEW
81.0000 mg | CHEWABLE_TABLET | Freq: Every morning | ORAL | Status: DC
  Administered 2011-08-09 – 2011-08-12 (×4): 81 mg via ORAL
  Filled 2011-08-09 (×4): qty 1

## 2011-08-09 MED ORDER — VITAMIN B-1 100 MG PO TABS
100.0000 mg | ORAL_TABLET | Freq: Every day | ORAL | Status: DC
  Administered 2011-08-10 – 2011-08-12 (×3): 100 mg via ORAL
  Filled 2011-08-09 (×4): qty 1

## 2011-08-09 MED ORDER — CITALOPRAM HYDROBROMIDE 20 MG PO TABS
20.0000 mg | ORAL_TABLET | Freq: Every day | ORAL | Status: DC
  Administered 2011-08-09 – 2011-08-11 (×3): 20 mg via ORAL
  Filled 2011-08-09 (×4): qty 1

## 2011-08-09 MED ORDER — THIAMINE HCL 100 MG PO TABS: 100.0000 mg | ORAL_TABLET | Freq: Every morning | ORAL | Status: DC

## 2011-08-09 MED ORDER — FERROUS SULFATE 325 (65 FE) MG PO TABS
325.0000 mg | ORAL_TABLET | Freq: Every day | ORAL | Status: DC
  Administered 2011-08-10 – 2011-08-12 (×3): 325 mg via ORAL
  Filled 2011-08-09 (×4): qty 1

## 2011-08-09 MED ORDER — FLUTICASONE-SALMETEROL 250-50 MCG/DOSE IN AEPB
1.0000 | INHALATION_SPRAY | Freq: Two times a day (BID) | RESPIRATORY_TRACT | Status: DC
  Administered 2011-08-09 – 2011-08-12 (×6): 1 via RESPIRATORY_TRACT
  Filled 2011-08-09: qty 14

## 2011-08-09 MED ORDER — SIMVASTATIN 40 MG PO TABS
40.0000 mg | ORAL_TABLET | Freq: Every day | ORAL | Status: DC
  Administered 2011-08-09 – 2011-08-11 (×3): 40 mg via ORAL
  Filled 2011-08-09 (×4): qty 1

## 2011-08-09 MED ORDER — LORAZEPAM 0.5 MG PO TABS: 0.5000 mg | ORAL_TABLET | Freq: Every day | ORAL | Status: DC | PRN

## 2011-08-09 MED ORDER — THERA M PLUS PO TABS: 1.0000 | ORAL_TABLET | Freq: Every morning | ORAL | Status: DC

## 2011-08-09 MED ORDER — INSULIN GLARGINE 100 UNIT/ML ~~LOC~~ SOLN
5.0000 [IU] | Freq: Every day | SUBCUTANEOUS | Status: DC
  Administered 2011-08-09 – 2011-08-11 (×3): 5 [IU] via SUBCUTANEOUS

## 2011-08-09 MED ORDER — ADULT MULTIVITAMIN W/MINERALS CH
1.0000 | ORAL_TABLET | Freq: Every day | ORAL | Status: DC
  Administered 2011-08-10 – 2011-08-12 (×3): 1 via ORAL
  Filled 2011-08-09 (×4): qty 1

## 2011-08-09 MED ORDER — FAMOTIDINE 20 MG PO TABS
20.0000 mg | ORAL_TABLET | Freq: Every day | ORAL | Status: DC
  Administered 2011-08-09 – 2011-08-11 (×3): 20 mg via ORAL
  Filled 2011-08-09 (×4): qty 1

## 2011-08-09 NOTE — Progress Notes (Signed)
ANTIBIOTIC CONSULT NOTE - FOLLOW UP  Pharmacy Consult for Imipenem Indication: Klebsiella bacteremia  No Known Allergies  Patient Measurements: Height: 5\' 10"  (177.8 cm) Weight: 185 lb 6.5 oz (84.1 kg) IBW/kg (Calculated) : 73    Vital Signs: Temp: 98.6 F (37 C) (05/02 1130) Temp src: Oral (05/02 1130) BP: 120/100 mmHg (05/02 1130) Pulse Rate: 85  (05/02 1130) Intake/Output from previous day: 05/01 0701 - 05/02 0700 In: 3040 [P.O.:340; I.V.:2300; IV Piggyback:400] Out: 2200 [Urine:2200] Intake/Output from this shift: Total I/O In: 100 [I.V.:100] Out: 500 [Urine:500]  Labs:  [facility] 08/09/11 0346 08/08/11 0335 08/07/11 1430  WBC 13.5* -- --  HGB 7.2* -- --  PLT 180 -- --  LABCREA -- -- --  CREATININE 2.41* 2.70* 2.60*   Estimated Creatinine Clearance: 28.2 ml/min (by C-G formula based on Cr of 2.41). No results found for this basename: VANCOTROUGH:2,VANCOPEAK:2,VANCORANDOM:2,GENTTROUGH:2,GENTPEAK:2,GENTRANDOM:2,TOBRATROUGH:2,TOBRAPEAK:2,TOBRARND:2,AMIKACINPEAK:2,AMIKACINTROU:2,AMIKACIN:2, in the last 72 hours   Microbiology: Recent Results (from the past 720 hour(s))  CULTURE, BLOOD (ROUTINE X 2)     Status: Normal   Collection Time   08/04/11  3:00 PM      Component Value Range Status Comment   Specimen Description BLOOD LEFT WRIST  5 ML IN [facility] BOTTLE   Final    Special Requests Normal   Final    Culture  Setup Time [id number]   Final    Culture     Final    Value: KLEBSIELLA PNEUMONIAE     Note: SUSCEPTIBILITIES PERFORMED ON PREVIOUS CULTURE WITHIN THE LAST 5 DAYS.     Note: Gram Stain Report Called to,Read Back By and Verified With: RN A. ARNOLD ON 08/05/11 AT 1630 BY TEDAR   Report Status 08/07/2011 FINAL   Final   CULTURE, BLOOD (ROUTINE X 2)     Status: Normal   Collection Time   08/04/11  3:55 PM      Component Value Range Status Comment   Specimen Description BLOOD RIGHT HAND  5 ML IN [facility] BOTTLE   Final    Special Requests Normal   Final    Culture  Setup Time [id number]   Final    Culture     Final    Value: KLEBSIELLA PNEUMONIAE     Note: Confirmed Extended Spectrum Beta-Lactamase Producer (ESBL)     Note: Gram Stain Report Called to,Read Back By and Verified With: RN A. ARNOLD ON 08/05/11 AT 1630 BY TEDAR   Report Status 08/07/2011 FINAL   Final    Organism ID, Bacteria KLEBSIELLA PNEUMONIAE   Final   URINE CULTURE     Status: Normal   Collection Time   08/04/11  8:24 PM      Component Value Range Status Comment   Specimen Description URINE, CATHETERIZED   Final    Special Requests Normal   Final    Culture  Setup Time [id number]   Final    Colony Count 75,000 COLONIES/ML   Final    Culture     Final    Value: Multiple bacterial morphotypes present, none predominant. Suggest appropriate recollection if clinically indicated.   Report Status 08/05/2011 FINAL   Final   MRSA PCR SCREENING     Status: Normal   Collection Time   08/04/11  9:48 PM      Component Value Range Status Comment   MRSA by PCR NEGATIVE  NEGATIVE  Final     Anti-infectives     Start     Dose/Rate Route Frequency Ordered Stop  08/06/11 1600   imipenem-cilastatin (PRIMAXIN) 250 mg in sodium chloride 0.9 % 100 mL IVPB        250 mg 200 mL/hr over 30 Minutes Intravenous Every 6 hours 08/06/11 0843     08/06/11 0900   imipenem-cilastatin (PRIMAXIN) 250 mg in sodium chloride 0.9 % 100 mL IVPB        250 mg 200 mL/hr over 30 Minutes Intravenous STAT 08/06/11 0843 08/06/11 0951   08/05/11 1900   vancomycin (VANCOCIN) 750 mg in sodium chloride 0.9 % 150 mL IVPB  Status:  Discontinued        750 mg 150 mL/hr over 60 Minutes Intravenous Every 24 hours 08/04/11 2237 08/08/11 0950   08/05/11 0200   piperacillin-tazobactam (ZOSYN) IVPB 3.375 g  Status:  Discontinued        3.375 g 12.5 mL/hr over 240 Minutes Intravenous Every 8 hours 08/04/11 2237 08/06/11 0805   08/04/11 1630   vancomycin (VANCOCIN) IVPB 1000 mg/200 mL premix        1,000 mg 200  mL/hr over 60 Minutes Intravenous  Once 08/04/11 1553 08/04/11 2003   08/04/11 1630  piperacillin-tazobactam (ZOSYN) IVPB 3.375 g       3.375 g 100 mL/hr over 30 Minutes Intravenous  Once 08/04/11 1553 08/04/11 1825          Assessment:  D#4 imipenem250 mg q6h for ESBL klebsiella bactermia.  Antibiotics de-escalated yesterday.   Slight improvement in Scr with crcl still 28 ml/min (baseline Scr 1.4)  WBC remains elevated but improved  Afebrile today   Goal of Therapy:  Imipenem per weight and renal function  Plan:  Continue imipenem 250 mg IV q6h Monitor renal function and adjust dose as needed  Erynn Vaca, Loma Messing PharmD 12:00 PM 08/09/2011

## 2011-08-09 NOTE — Progress Notes (Signed)
Subjective: Patient has an expressive aphasia but is able to answer most questions appropriately. He is nontoxic-appearing and indicates that he feels good.   Interval History: Pt had a large bowel movement after enema yesterday. Pt's Cr. decreased to 2.41 from 2.70 since yesterday. Objective: Filed Vitals:   08/09/11 0433 08/09/11 0455 08/09/11 0800 08/09/11 0822  BP:  116/53 132/63   Pulse:  74 74   Temp:   98.3 F (36.8 C)   TempSrc:   Oral   Resp:  16 16   Height:      Weight:      SpO2: 96% 93% 94% 96%   Weight change: 0.5 kg (1 lb 1.6 oz)  Intake/Output Summary (Last 24 hours) at 08/09/11 0903 Last data filed at 08/09/11 0800  Gross per 24 hour  Intake   2840 ml  Output   2000 ml  Net    840 ml    General: Alert, awake, oriented x3, in no acute distress.  HEENT: [place]/AT PEERL, EOMI Neck: Trachea midline,  no masses, no thyromegal,y no JVD, no carotid bruit OROPHARYNX:  Moist, No exudate/ erythema/lesions.  Heart: Regular rate and rhythm, without murmurs, rubs, gallops, PMI non-displaced, no heaves or thrills on palpation.  Lungs: Clear to auscultation, no wheezing or rhonchi noted. No increased vocal fremitus resonant to percussion  Abdomen: Non distended, soft, positive bowel sounds, no masses no hepatosplenomegaly noted..  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 Otherwise no focal neurological deficits noted cranial nerves II through XII grossly intact.  Musculoskeletal: No warm swelling or erythema around joints, no spinal tenderness noted. Psychiatric: Patient alert and oriented x3, good insight and cognition.   Lab Results:  Basename 08/09/11 0346 08/08/11 0335  NA 136 137  K 3.6 4.0  CL 107 108  CO2 22 20  GLUCOSE 107* 115*  BUN 31* 34*  CREATININE 2.41* 2.70*  CALCIUM 8.8 8.7  MG -- --  PHOS -- --   No results found for this basename: AST:2,ALT:2,ALKPHOS:2,BILITOT:2,PROT:2,ALBUMIN:2 in the last 72 hours No results found  for this basename: LIPASE:2,AMYLASE:2 in the last 72 hours  Basename 08/09/11 0346  WBC 13.5*  NEUTROABS 10.4*  HGB 7.2*  HCT 22.4*  MCV 86.5  PLT 180   No results found for this basename: CKTOTAL:3,CKMB:3,CKMBINDEX:3,TROPONINI:3 in the last 72 hours No components found with this basename: POCBNP:3 No results found for this basename: DDIMER:2 in the last 72 hours No results found for this basename: HGBA1C:2 in the last 72 hours No results found for this basename: CHOL:2,HDL:2,LDLCALC:2,TRIG:2,CHOLHDL:2,LDLDIRECT:2 in the last 72 hours No results found for this basename: TSH,T4TOTAL,FREET3,T3FREE,THYROIDAB in the last 72 hours No results found for this basename: VITAMINB12:2,FOLATE:2,FERRITIN:2,TIBC:2,IRON:2,RETICCTPCT:2 in the last 72 hours  Micro Results: Recent Results (from the past 240 hour(s))  CULTURE, BLOOD (ROUTINE X 2)     Status: Normal   Collection Time   08/04/11  3:00 PM      Component Value Range Status Comment   Specimen Description BLOOD LEFT WRIST  5 ML IN [facility] BOTTLE   Final    Special Requests Normal   Final    Culture  Setup Time [id number]   Final    Culture     Final    Value: KLEBSIELLA PNEUMONIAE     Note: SUSCEPTIBILITIES PERFORMED ON PREVIOUS CULTURE WITHIN THE LAST 5 DAYS.     Note: Gram Stain Report Called to,Read Back By and Verified With: RN A. ARNOLD ON 08/05/11 AT 1630 BY  TEDAR   Report Status 08/07/2011 FINAL   Final   CULTURE, BLOOD (ROUTINE X 2)     Status: Normal   Collection Time   08/04/11  3:55 PM      Component Value Range Status Comment   Specimen Description BLOOD RIGHT HAND  5 ML IN [facility] BOTTLE   Final    Special Requests Normal   Final    Culture  Setup Time [id number]   Final    Culture     Final    Value: KLEBSIELLA PNEUMONIAE     Note: Confirmed Extended Spectrum Beta-Lactamase Producer (ESBL)     Note: Gram Stain Report Called to,Read Back By and Verified With: RN A. ARNOLD ON 08/05/11 AT 1630 BY TEDAR   Report Status  08/07/2011 FINAL   Final    Organism ID, Bacteria KLEBSIELLA PNEUMONIAE   Final   URINE CULTURE     Status: Normal   Collection Time   08/04/11  8:24 PM      Component Value Range Status Comment   Specimen Description URINE, CATHETERIZED   Final    Special Requests Normal   Final    Culture  Setup Time [id number]   Final    Colony Count 75,000 COLONIES/ML   Final    Culture     Final    Value: Multiple bacterial morphotypes present, none predominant. Suggest appropriate recollection if clinically indicated.   Report Status 08/05/2011 FINAL   Final   MRSA PCR SCREENING     Status: Normal   Collection Time   08/04/11  9:48 PM      Component Value Range Status Comment   MRSA by PCR NEGATIVE  NEGATIVE  Final     Studies/Results: Ct Abdomen Pelvis Wo Contrast  08/07/2011  *RADIOLOGY REPORT*  Clinical Data: Possible left ureteral stone with left hydronephrosis and acute renal failure  CT ABDOMEN AND PELVIS WITHOUT CONTRAST  Technique:  Multidetector CT imaging of the abdomen and pelvis was performed following the standard protocol without intravenous contrast.  Comparison: 09/19/2010  Findings: Lung bases shows small left pleural effusion with left lower lobe posterior atelectasis or infiltrate.  Trace right pleural effusion with right basilar atelectasis.  Sagittal images of the spine shows osteopenia and degenerative changes thoracolumbar spine.  Unenhanced liver is unremarkable.  Unenhanced spleen, pancreas and adrenal glands are unremarkable.  There is a nonobstructive calcified calculus mid pole of the right kidney measures 11 x 8.7 mm.  There is mild right perinephric stranding.  In axial image 47 there is 5 mm and 9 mm calcified calculus in the proximal right ureter at L3-L4 disc space.  There is mild left hydronephrosis.  Multiple calcified calculi are noted in the upper pole of the left kidney the largest measures 9.7 mm.  Calcified calculus in mid pole of the left kidney measures 4.7 mm.   In axial image 43 there is a 10.4 by 8 mm calcified obstructive calculus in proximal left ureter at level of L3 vertebral body. There is  left perinephric stranding.  No calcified gallstones are noted within gallbladder. Atherosclerotic calcifications are noted abdominal aorta and the iliac arteries.  Bilateral distal ureter is unremarkable.  There is a Foley catheter in a decompressed urinary bladder.  Moderate stool noted within rectum.  The rectum is distended measures 7.6 cm in diameter suspicious for fecal impaction.  No small bowel obstruction.  No pericecal inflammation.  No destructive bony lesions are noted within pelvis. 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  In upper pole of the left kidney there is a cyst measures 4.3 x 4 cm.  IMPRESSION:  1.  Bilateral pleural effusions left greater than right with bilateral basilar posterior atelectasis. 2.  Bilateral nephrolithiasis.  Largest nonobstructive calcified calculus in mid pole of the left kidney measures 11 x 8.7 mm. Largest calculus in upper pole of the left kidney measures 9.7 mm. 3.  There is a calcified calculus in proximal right ureter measures 5 x  9 mm. Mild left perinephric stranding. 4.  There is a obstructive calcified calculus in proximal left ureter measures 10.4 x 8 mm.  Mild left hydronephrosis and mild left perinephric stranding. 5.  Atherosclerotic calcifications of the abdominal aorta and the iliac arteries.  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 6. Significant stool noted within rectum which is distended measures at least 7.6 cm in diameter suspicious for fecal impaction. 7.  There is a Foley catheter in a decompressed urinary bladder.  Original Report Authenticated By: Natasha Mead, M.D.   Ct Head Wo Contrast  08/04/2011  *RADIOLOGY REPORT*  Clinical Data: Dementia.  Stroke.  Mental status changes.  CT HEAD WITHOUT CONTRAST  Technique:  Contiguous axial images were  obtained from the base of the skull through the vertex without contrast.  Comparison: 07/11/2010  Findings: The brain shows generalized atrophy.  There is a old infarction in the left middle cerebral artery territory which has progressed atrophy encephalomalacia, similar to the previous study. No sign of acute infarction, mass lesion, hemorrhage, hydrocephalus or extra-axial collection.  The calvarium is unremarkable.  There are mucosal inflammatory changes of the maxillary and ethmoid sinuses.  IMPRESSION: No acute intracranial finding.  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  Some mucosal inflammation of the paranasal sinuses.  Original Report Authenticated By: Thomasenia Sales, M.D.   US Renal Port  08/06/2011  *RADIOLOGY REPORT*  Clinical Data: Hydronephrosis, renal failure.  RENAL/URINARY TRACT ULTRASOUND COMPLETE  Comparison:  CT 09/19/2010  Findings:  Right Kidney:  10.6 cm.  Lower pole not well visualized due to overlying bowel gas.  No hydronephrosis.  Left Kidney:  13.6 cm.  There is mild left hydronephrosis. Elongated echogenic area noted within the proximal left ureter just below the ureteropelvic junction, presumably obstructing stone. 4.6 cm exophytic cyst off the upper pole.  Bladder:  Foley catheter in place.  The bladder is decompressed.  IMPRESSION: Mild left hydronephrosis.  There appears to be an obstructing proximal left ureteral stone.  Original Report Authenticated By: Cyndie Chime, M.D.   Dg Chest Portable 1 View  08/04/2011  *RADIOLOGY REPORT*  Clinical Data: Shortness of breath.  Diabetes.  Chronic bronchitis.  PORTABLE CHEST - 1 VIEW  Comparison: 09/25/2010.  Findings: Normal sized heart.  Clear lungs.  Mild osteopenia and bilateral shoulder degenerative changes.  Thoracic spine degenerative changes and mild scoliosis.  IMPRESSION: No acute abnormality.  Original Report Authenticated By: Darrol Angel, M.D.   Dg Abd 2 Views  08/04/2011  *RADIOLOGY REPORT*  Clinical Data: Abdominal  pain.  ABDOMEN - 2 VIEW  Comparison: Portable chest obtained earlier today.  Findings: Mildly dilated small bowel loops in the right mid to upper abdomen.  Mildly prominent colon filled with gas and stool. Prominent stool in the rectum.  No free peritoneal air.  Lumbar and lower thoracic spine degenerative changes.  Faintly visualized left renal calculi, better seen on the CT dated  09/08/2010.  Previously demonstrated right renal calculi are obscured by the bowel.  Right inferior pelvic phlebolith.  IMPRESSION:  1.  Mild small bowel ileus or partial obstruction. 2.  Mildly prominent stool in the rectum. 3.  Left renal calculi.  Original Report Authenticated By: Darrol Angel, M.D.    Medications: I have reviewed the patient's current medications. Scheduled Meds:    . ipratropium  0.5 mg Nebulization Q4H   And  . albuterol  2.5 mg Nebulization Q4H  . antiseptic oral rinse  15 mL Mouth Rinse q12n4p  . chlorhexidine  15 mL Mouth Rinse BID  . enoxaparin  30 mg Subcutaneous Q24H  . famotidine (PEPCID) IV  20 mg Intravenous Q24H  . imipenem-cilastatin  250 mg Intravenous Q6H  . insulin aspart  0-9 Units Subcutaneous Q4H  . sodium chloride  1,000 mL Intravenous Once  . sodium chloride  3 mL Intravenous Q12H  . sodium phosphate  1 enema Rectal Once  . DISCONTD: famotidine (PEPCID) IV  20 mg Intravenous Q12H  . DISCONTD: vancomycin  750 mg Intravenous Q24H   Continuous Infusions:    . sodium chloride 1,000 mL (08/09/11 0751)   PRN Meds:.acetaminophen, acetaminophen, alum & mag hydroxide-simeth, HYDROmorphone, ondansetron (ZOFRAN) IV, ondansetron, oxyCODONE, zolpidem Assessment/Plan: Patient Active Hospital Problem List: Sepsis (08/04/2011)   Assessment: Sepsis physiology resolved. PT now has stable BP and HR and appears non-toxic. Pt presently on Primaxin day#3 ad Vancomycin Day#5. Urine cultures negative from this hospitalization, however BC showed Klebsiella in 2/2 bottles which is sensitive  to Imipenem and Zosyn.   Plan: Will continue Imipenem.  Obstructive chronic bronchitis with exacerbation ()   Assessment: Quiescent    Type II or unspecified type diabetes mellitus without mention of complication, not stated as uncontrolled ()   Assessment:  Blood sugars adequately controlled   Plan: Continue SSI  Acute renal failure) (08/04/2011)   Assessment: Pt has a baseline Cr of 1.4. Presently at 2.4   Plan: Continue Hydration.   Urinary retention (08/04/2011)   Assessment: Will defer to Urology   Fever (08/04/2011)   Assessment: Pt has been afebrile for last 12 hours    Leukocytosis (08/04/2011)   Assessment: Still elevated. Will continue to monitor.    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    Plan: Defer to Urology  Fecal Impaction (08/08/2011)   Assessment: Relieved after enema.  Transfer to non-tele bed.   LOS: 5 days

## 2011-08-09 NOTE — Progress Notes (Signed)
CARE MANAGE MENT UTILIZATION REVIEW NOTE 08/09/2011     Patient:  Justin Sherman,Justin Sherman   Account Number:  [id number]  Documented by:  JYNWGN Raelan Burgoon   Per Ur Regulation Reviewed for med. necessity/level of care/duration of stay

## 2011-08-09 NOTE — Progress Notes (Signed)
The patient is receiving Famotidine by the intravenous route.  Based on criteria approved by the Pharmacy and Therapeutics Committee and the Medical Executive Committee, the medication is being converted to the equivalent oral dose form.  These criteria include: -No Active GI bleeding -Able to tolerate diet of full liquids (or better) or tube feeding -Able to tolerate other medications by the oral or enteral route  If you have any questions about this conversion, please contact the Pharmacy Department (ext [phone]).  Thank you.  Clydene Fake, [facility] 08/09/2011 12:04 PM

## 2011-08-10 ENCOUNTER — Inpatient Hospital Stay (HOSPITAL_COMMUNITY): Payer: Medicare Other

## 2011-08-10 DIAGNOSIS — R7881 Bacteremia: Secondary | ICD-10-CM

## 2011-08-10 DIAGNOSIS — N179 Acute kidney failure, unspecified: Secondary | ICD-10-CM

## 2011-08-10 DIAGNOSIS — R339 Retention of urine, unspecified: Secondary | ICD-10-CM

## 2011-08-10 DIAGNOSIS — R5081 Fever presenting with conditions classified elsewhere: Secondary | ICD-10-CM

## 2011-08-10 LAB — COMPREHENSIVE METABOLIC PANEL
ALT: 21 U/L (ref 0–53)
AST: 25 U/L (ref 0–37)
Alkaline Phosphatase: 64 U/L (ref 39–117)
CO2: 23 mEq/L (ref 19–32)
Calcium: 8.9 mg/dL (ref 8.4–10.5)
Potassium: 4.1 mEq/L (ref 3.5–5.1)
Sodium: 137 mEq/L (ref 135–145)
Total Protein: 6.3 g/dL (ref 6.0–8.3)

## 2011-08-10 LAB — CBC
Hemoglobin: 7.2 g/dL — ABNORMAL LOW (ref 13.0–17.0)
Hemoglobin: 7.5 g/dL — ABNORMAL LOW (ref 13.0–17.0)
MCH: 27.5 pg (ref 26.0–34.0)
MCHC: 31.6 g/dL (ref 30.0–36.0)
MCV: 85.9 fL (ref 78.0–100.0)
RBC: 2.62 MIL/uL — ABNORMAL LOW (ref 4.22–5.81)
WBC: 15.7 10*3/uL — ABNORMAL HIGH (ref 4.0–10.5)

## 2011-08-10 LAB — BASIC METABOLIC PANEL
BUN: 27 mg/dL — ABNORMAL HIGH (ref 6–23)
Chloride: 106 mEq/L (ref 96–112)
GFR calc Af Amer: 34 mL/min — ABNORMAL LOW (ref >90)
Potassium: 3.6 mEq/L (ref 3.5–5.1)
Sodium: 136 mEq/L (ref 135–145)

## 2011-08-10 LAB — APTT: aPTT: 39 seconds — ABNORMAL HIGH (ref 24–37)

## 2011-08-10 LAB — GLUCOSE, CAPILLARY
Glucose-Capillary: 101 mg/dL — ABNORMAL HIGH (ref 70–99)
Glucose-Capillary: 99 mg/dL (ref 70–99)
Glucose-Capillary: 107 mg/dL — ABNORMAL HIGH (ref 70–99)

## 2011-08-10 LAB — PROTIME-INR: Prothrombin Time: 14.3 seconds (ref 11.6–15.2)

## 2011-08-10 LAB — DIFFERENTIAL
Basophils Absolute: 0 10*3/uL (ref 0.0–0.1)
Basophils Relative: 1 % (ref 0–1)
Basophils Relative: 0 % (ref 0–1)
Eosinophils Absolute: 0.6 10*3/uL (ref 0.0–0.7)
Eosinophils Relative: 4 % (ref 0–5)
Lymphocytes Relative: 8 % — ABNORMAL LOW (ref 12–46)
Monocytes Absolute: 1 10*3/uL (ref 0.1–1.0)
Monocytes Relative: 6 % (ref 3–12)
Neutrophils Relative %: 78 % — ABNORMAL HIGH (ref 43–77)
Neutrophils Relative %: 82 % — ABNORMAL HIGH (ref 43–77)

## 2011-08-10 MED ORDER — SODIUM CHLORIDE 0.9 % IV SOLN
1.0000 g | INTRAVENOUS | Status: DC
  Administered 2011-08-10 – 2011-08-11 (×2): 1 g via INTRAVENOUS
  Filled 2011-08-10 (×3): qty 1

## 2011-08-10 MED ORDER — SODIUM CHLORIDE 0.9 % IJ SOLN
10.0000 mL | INTRAMUSCULAR | Status: DC | PRN
Start: 2011-08-10 — End: 2011-08-12

## 2011-08-10 MED ORDER — ENOXAPARIN SODIUM 40 MG/0.4ML ~~LOC~~ SOLN
40.0000 mg | SUBCUTANEOUS | Status: DC
  Administered 2011-08-10 – 2011-08-11 (×2): 40 mg via SUBCUTANEOUS
  Filled 2011-08-10 (×3): qty 0.4

## 2011-08-10 NOTE — Progress Notes (Signed)
Patient ID: Justin Sherman, male   DOB: [birth year], 71 y.o.   MRN: [id number]  Called by Dr. Ashley Royalty to evaluate patient with acute abdominal pain/distension  Objective: Focused examination: Vital signs: Temp 99.3, Pulse 87, BP 164/91, Resp 22 Gen: Intermittently moaning. States that he is in pain. Lungs: Mild wheezing diffusely, No ronchi. Heart: RRR Abdomen: Markedly distended,  Firm, diffusely tender, tympanic bowel sounds  Assessment/Plan Acute abdominal changes clinically. Acute abdominal series shows a nonobstructed bowel gas pattern, no free air. As per Dr. Ashley Royalty instructions as there is no evidence of perforation will proceed with CT abdomen and pelvis with oral contrast. Labs results are pending. Will continue close monitoring.

## 2011-08-10 NOTE — Progress Notes (Signed)
Subjective: Patient reports more alert. Post JJ bilateral.   Objective: Vital signs in last 24 hours: Temp:  [98.9 F (37.2 C)-99.4 F (37.4 C)] 98.9 F (37.2 C) (05/03 0559) Pulse Rate:  [82-88] 82  (05/03 0559) Resp:  [18-20] 20  (05/03 0559) BP: (145-164)/(77-78) 145/78 mmHg (05/03 0559) SpO2:  [91 %-94 %] 92 % (05/03 1156)A  Intake/Output from previous day: 05/02 0701 - 05/03 0700 In: 2646.7 [P.O.:720; Sherman.V.:1726.7; IV Piggyback:200] Out: 3700 [Urine:3700] Intake/Output this shift: Total Sherman/O In: 480 [P.O.:480] Out: 2601 [Urine:2600; Stool:1]  Past Medical History  Diagnosis Date  . Alterations of sensations, late effect of cerebrovascular disease   . Acute conjunctivitis, unspecified   . Aortic aneurysm of unspecified site without mention of rupture   . Flaccid hemiplegia affecting dominant side   . Type II or unspecified type diabetes mellitus without mention of complication, not stated as uncontrolled   . Depressive disorder, not elsewhere classified   . Unspecified essential hypertension   . Obstructive chronic bronchitis with exacerbation   . Anxiety state, unspecified   . Esophageal reflux   . Other and unspecified hyperlipidemia   . Pressure ulcer, other site   . Dementia     Physical Exam:  General: thin male in NAD Lungs - Normal respiratory effort, chest expands symmetrically.  Abdomen - Soft, non-tender & non-distended.  Lab Results:  Basename 08/10/11 0425 08/09/11 0346  WBC 15.9* 13.5*  HGB 7.2* 7.2*  HCT 22.5* 22.4*   BMET  Basename 08/10/11 0425 08/09/11 0346  NA 136 136  K 3.6 3.6  CL 106 107  CO2 21 22  GLUCOSE 100* 107*  BUN 27* 31*  CREATININE 2.11* 2.41*  CALCIUM 8.7 8.8   No results found for this basename: LABURIN:1 in the last 72 hours Results for orders placed during the hospital encounter of 08/04/11  CULTURE, BLOOD (ROUTINE X 2)     Status: Normal   Collection Time   08/04/11  3:00 PM      Component Value Range Status  Comment   Specimen Description BLOOD LEFT WRIST  5 ML IN [facility] BOTTLE   Final    Special Requests Normal   Final    Culture  Setup Time [id number]   Final    Culture     Final    Value: KLEBSIELLA PNEUMONIAE     Note: SUSCEPTIBILITIES PERFORMED ON PREVIOUS CULTURE WITHIN THE LAST 5 DAYS.     Note: Gram Stain Report Called to,Read Back By and Verified With: RN A. ARNOLD ON 08/05/11 AT 1630 BY TEDAR   Report Status 08/07/2011 FINAL   Final   CULTURE, BLOOD (ROUTINE X 2)     Status: Normal   Collection Time   08/04/11  3:55 PM      Component Value Range Status Comment   Specimen Description BLOOD RIGHT HAND  5 ML IN [facility] BOTTLE   Final    Special Requests Normal   Final    Culture  Setup Time [id number]   Final    Culture     Final    Value: KLEBSIELLA PNEUMONIAE     Note: Confirmed Extended Spectrum Beta-Lactamase Producer (ESBL)     Note: Gram Stain Report Called to,Read Back By and Verified With: RN A. ARNOLD ON 08/05/11 AT 1630 BY TEDAR   Report Status 08/07/2011 FINAL   Final    Organism ID, Bacteria KLEBSIELLA PNEUMONIAE   Final   URINE CULTURE     Status: Normal  Collection Time   08/04/11  8:24 PM      Component Value Range Status Comment   Specimen Description URINE, CATHETERIZED   Final    Special Requests Normal   Final    Culture  Setup Time [id number]   Final    Colony Count 75,000 COLONIES/ML   Final    Culture     Final    Value: Multiple bacterial morphotypes present, none predominant. Suggest appropriate recollection if clinically indicated.   Report Status 08/05/2011 FINAL   Final   MRSA PCR SCREENING     Status: Normal   Collection Time   08/04/11  9:48 PM      Component Value Range Status Comment   MRSA by PCR NEGATIVE  NEGATIVE  Final     Studies/Results: @[id number] @  Assessment/Plan: OK for D/c per medicine.Needs urology follow-up [phone]. Will eventually need stone extraction-possibly lithotripsy. Not inability of legs to placed in good position for  ureteroscopy. Son or daughter-in-law needs to accompany pt to office visit.   Justin Sherman 08/10/2011, 6:00 PM

## 2011-08-10 NOTE — Progress Notes (Signed)
CSW continues to follow to assist with d/c planning to [facility] when medically stable.    [phone]

## 2011-08-10 NOTE — Progress Notes (Addendum)
ANTIBIOTIC CONSULT NOTE - INITIAL  Pharmacy Consult for Invanz (ertapenem) Indication: ESBL-producing Klebsiella pneumoniae bacteremia   No Known Allergies  Patient Measurements: Height: 5\' 10"  (177.8 cm) Weight: 185 lb 6.5 oz (84.1 kg) IBW/kg (Calculated) : 73    Vital Signs: Temp: 98.9 F (37.2 C) (05/03 0559) Temp src: Oral (05/03 0559) BP: 145/78 mmHg (05/03 0559) Pulse Rate: 82  (05/03 0559)  Intake/Output from previous day: 05/02 0701 - 05/03 0700 In: 2646.7 [P.O.:720; I.V.:1726.7; IV Piggyback:200] Out: 3700 [Urine:3700] Intake/Output from this shift: Total I/O In: 240 [P.O.:240] Out: 1400 [Urine:1400]  Labs:  Basename 08/10/11 0425 08/09/11 0346 08/08/11 0335  WBC 15.9* 13.5* --  HGB 7.2* 7.2* --  PLT 251 180 --  LABCREA -- -- --  CREATININE 2.11* 2.41* 2.70*   Estimated Creatinine Clearance 31.9 ml/min/1.88m*2 based on SCr 2.1.  Microbiology: Recent Results (from the past 720 hour(s))  CULTURE, BLOOD (ROUTINE X 2)     Status: Normal   Collection Time   08/04/11  3:00 PM      Component Value Range Status Comment   Specimen Description BLOOD LEFT WRIST  5 ML IN [facility] BOTTLE   Final    Special Requests Normal   Final    Culture  Setup Time [id number]   Final    Culture     Final    Value: KLEBSIELLA PNEUMONIAE     Note: SUSCEPTIBILITIES PERFORMED ON PREVIOUS CULTURE WITHIN THE LAST 5 DAYS.     Note: Gram Stain Report Called to,Read Back By and Verified With: RN A. ARNOLD ON 08/05/11 AT 1630 BY TEDAR   Report Status 08/07/2011 FINAL   Final   CULTURE, BLOOD (ROUTINE X 2)     Status: Normal   Collection Time   08/04/11  3:55 PM      Component Value Range Status Comment   Specimen Description BLOOD RIGHT HAND  5 ML IN [facility] BOTTLE   Final    Special Requests Normal   Final    Culture  Setup Time [id number]   Final    Culture     Final    Value: KLEBSIELLA PNEUMONIAE     Note: Confirmed Extended Spectrum Beta-Lactamase Producer (ESBL)     Note: Gram  Stain Report Called to,Read Back By and Verified With: RN A. ARNOLD ON 08/05/11 AT 1630 BY TEDAR   Report Status 08/07/2011 FINAL   Final    Organism ID, Bacteria KLEBSIELLA PNEUMONIAE   Final   URINE CULTURE     Status: Normal   Collection Time   08/04/11  8:24 PM      Component Value Range Status Comment   Specimen Description URINE, CATHETERIZED   Final    Special Requests Normal   Final    Culture  Setup Time [id number]   Final    Colony Count 75,000 COLONIES/ML   Final    Culture     Final    Value: Multiple bacterial morphotypes present, none predominant. Suggest appropriate recollection if clinically indicated.   Report Status 08/05/2011 FINAL   Final   MRSA PCR SCREENING     Status: Normal   Collection Time   08/04/11  9:48 PM      Component Value Range Status Comment   MRSA by PCR NEGATIVE  NEGATIVE  Final     Medical History: Past Medical History  Diagnosis Date  . Alterations of sensations, late effect of cerebrovascular disease   . Acute conjunctivitis, unspecified   .  Aortic aneurysm of unspecified site without mention of rupture   . Flaccid hemiplegia affecting dominant side   . Type II or unspecified type diabetes mellitus without mention of complication, not stated as uncontrolled   . Depressive disorder, not elsewhere classified   . Unspecified essential hypertension   . Obstructive chronic bronchitis with exacerbation   . Anxiety state, unspecified   . Esophageal reflux   . Other and unspecified hyperlipidemia   . Pressure ulcer, other site   . Dementia     Medications (Inpatient List): Scheduled:    . ipratropium  0.5 mg Nebulization Q4H   And  . albuterol  2.5 mg Nebulization Q4H  . aspirin  81 mg Oral q morning - 10a  . citalopram  20 mg Oral QHS  . enoxaparin  40 mg Subcutaneous Q24H  . ertapenem  1 g Intravenous Q24H  . famotidine  20 mg Oral QHS  . ferrous sulfate  325 mg Oral Q breakfast  . Fluticasone-Salmeterol  1 puff Inhalation BID  .  insulin aspart  0-9 Units Subcutaneous Q4H  . insulin glargine  5 Units Subcutaneous QHS  . mulitivitamin with minerals  1 tablet Oral Daily  . simvastatin  40 mg Oral QHS  . sodium chloride  1,000 mL Intravenous Once  . sodium chloride  3 mL Intravenous Q12H  . thiamine  100 mg Oral Daily  . DISCONTD: antiseptic oral rinse  15 mL Mouth Rinse q12n4p  . DISCONTD: chlorhexidine  15 mL Mouth Rinse BID  . DISCONTD: enoxaparin  30 mg Subcutaneous Q24H  . DISCONTD: imipenem-cilastatin  250 mg Intravenous Q6H    Assessment: Asked to assist with Invanz therapy for 71 year-old male with ESBL-producing Klebsiella in 2/2 blood cultures.  Previous antibiotics include Vancomycin, Zosyn, and Imipenem.  Calculated CrCl is borderline for full-dose Invanz at 31.9 ml/min/1.59m*2.  If the CrCl declines to 30 ml/min/1.3m*2, we will reduce the dose to 500 mg every 24 hours.  Goal of Therapy:  Eradication of infection  Plan:   Invanz 1 gram IV every 24 hours  Adjust dose if needed for renal insufficiency  [employer] R.Ph. 08/10/2011,3:51 PM

## 2011-08-10 NOTE — Progress Notes (Addendum)
Subjective: Pt seen at approximately 3:00pm to day tatient sleeping upon arrival to room.Easily arousable and states that he has no new complaints today.    Objective: Filed Vitals:   08/10/11 0343 08/10/11 0559 08/10/11 0819 08/10/11 1156  BP:  145/78    Pulse:  82    Temp:  98.9 F (37.2 C)    TempSrc:  Oral    Resp: 20 20    Height:      Weight:      SpO2:  93% 92% 92%   Weight change:   Intake/Output Summary (Last 24 hours) at 08/10/11 1803 Last data filed at 08/10/11 1732  Gross per 24 hour  Intake   2160 ml  Output   5301 ml  Net  -3141 ml    General: Alert, awake, oriented x3, in no acute distress.  HEENT: [place]/AT PEERL, EOMI Neck: Trachea midline,  no masses, no thyromegal,y no JVD, no carotid bruit OROPHARYNX:  Moist, No exudate/ erythema/lesions.  Heart: Regular rate and rhythm, without murmurs, rubs, gallops, PMI non-displaced, no heaves or thrills on palpation.  Lungs: Clear to auscultation, no wheezing or rhonchi noted. No increased vocal fremitus resonant to percussion  Abdomen: Non distended, soft, positive bowel sounds, no masses no hepatosplenomegaly noted..  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 Otherwise no focal neurological deficits noted cranial nerves II through XII grossly intact.  Musculoskeletal: No warm swelling or erythema around joints, no spinal tenderness noted. Psychiatric: Patient alert and oriented x3, good insight and cognition.   Lab Results:  Basename 08/10/11 0425 08/09/11 0346  NA 136 136  K 3.6 3.6  CL 106 107  CO2 21 22  GLUCOSE 100* 107*  BUN 27* 31*  CREATININE 2.11* 2.41*  CALCIUM 8.7 8.8  MG -- --  PHOS -- --   No results found for this basename: AST:2,ALT:2,ALKPHOS:2,BILITOT:2,PROT:2,ALBUMIN:2 in the last 72 hours No results found for this basename: LIPASE:2,AMYLASE:2 in the last 72 hours  Basename 08/10/11 0425 08/09/11 0346  WBC 15.9* 13.5*  NEUTROABS 12.4* 10.4*  HGB 7.2* 7.2*   HCT 22.5* 22.4*  MCV 85.9 86.5  PLT 251 180   No results found for this basename: CKTOTAL:3,CKMB:3,CKMBINDEX:3,TROPONINI:3 in the last 72 hours No components found with this basename: POCBNP:3 No results found for this basename: DDIMER:2 in the last 72 hours  Basename 08/09/11 0346  HGBA1C 6.0*   No results found for this basename: CHOL:2,HDL:2,LDLCALC:2,TRIG:2,CHOLHDL:2,LDLDIRECT:2 in the last 72 hours No results found for this basename: TSH,T4TOTAL,FREET3,T3FREE,THYROIDAB in the last 72 hours No results found for this basename: VITAMINB12:2,FOLATE:2,FERRITIN:2,TIBC:2,IRON:2,RETICCTPCT:2 in the last 72 hours  Micro Results: Recent Results (from the past 240 hour(s))  CULTURE, BLOOD (ROUTINE X 2)     Status: Normal   Collection Time   08/04/11  3:00 PM      Component Value Range Status Comment   Specimen Description BLOOD LEFT WRIST  5 ML IN [facility] BOTTLE   Final    Special Requests Normal   Final    Culture  Setup Time [id number]   Final    Culture     Final    Value: KLEBSIELLA PNEUMONIAE     Note: SUSCEPTIBILITIES PERFORMED ON PREVIOUS CULTURE WITHIN THE LAST 5 DAYS.     Note: Gram Stain Report Called to,Read Back By and Verified With: RN A. ARNOLD ON 08/05/11 AT 1630 BY TEDAR   Report Status 08/07/2011 FINAL   Final   CULTURE, BLOOD (ROUTINE X 2)  Status: Normal   Collection Time   08/04/11  3:55 PM      Component Value Range Status Comment   Specimen Description BLOOD RIGHT HAND  5 ML IN [facility] BOTTLE   Final    Special Requests Normal   Final    Culture  Setup Time [id number]   Final    Culture     Final    Value: KLEBSIELLA PNEUMONIAE     Note: Confirmed Extended Spectrum Beta-Lactamase Producer (ESBL)     Note: Gram Stain Report Called to,Read Back By and Verified With: RN A. ARNOLD ON 08/05/11 AT 1630 BY TEDAR   Report Status 08/07/2011 FINAL   Final    Organism ID, Bacteria KLEBSIELLA PNEUMONIAE   Final   URINE CULTURE     Status: Normal   Collection Time    08/04/11  8:24 PM      Component Value Range Status Comment   Specimen Description URINE, CATHETERIZED   Final    Special Requests Normal   Final    Culture  Setup Time [id number]   Final    Colony Count 75,000 COLONIES/ML   Final    Culture     Final    Value: Multiple bacterial morphotypes present, none predominant. Suggest appropriate recollection if clinically indicated.   Report Status 08/05/2011 FINAL   Final   MRSA PCR SCREENING     Status: Normal   Collection Time   08/04/11  9:48 PM      Component Value Range Status Comment   MRSA by PCR NEGATIVE  NEGATIVE  Final     Studies/Results: Ct Abdomen Pelvis Wo Contrast  08/07/2011  *RADIOLOGY REPORT*  Clinical Data: Possible left ureteral stone with left hydronephrosis and acute renal failure  CT ABDOMEN AND PELVIS WITHOUT CONTRAST  Technique:  Multidetector CT imaging of the abdomen and pelvis was performed following the standard protocol without intravenous contrast.  Comparison: 09/19/2010  Findings: Lung bases shows small left pleural effusion with left lower lobe posterior atelectasis or infiltrate.  Trace right pleural effusion with right basilar atelectasis.  Sagittal images of the spine shows osteopenia and degenerative changes thoracolumbar spine.  Unenhanced liver is unremarkable.  Unenhanced spleen, pancreas and adrenal glands are unremarkable.  There is a nonobstructive calcified calculus mid pole of the right kidney measures 11 x 8.7 mm.  There is mild right perinephric stranding.  In axial image 47 there is 5 mm and 9 mm calcified calculus in the proximal right ureter at L3-L4 disc space.  There is mild left hydronephrosis.  Multiple calcified calculi are noted in the upper pole of the left kidney the largest measures 9.7 mm.  Calcified calculus in mid pole of the left kidney measures 4.7 mm.  In axial image 43 there is a 10.4 by 8 mm calcified obstructive calculus in proximal left ureter at level of L3 vertebral body. There is   left perinephric stranding.  No calcified gallstones are noted within gallbladder. Atherosclerotic calcifications are noted abdominal aorta and the iliac arteries.  Bilateral distal ureter is unremarkable.  There is a Foley catheter in a decompressed urinary bladder.  Moderate stool noted within rectum.  The rectum is distended measures 7.6 cm in diameter suspicious for fecal impaction.  No small bowel obstruction.  No pericecal inflammation.  No destructive bony lesions are noted within pelvis. 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  In upper pole of the left kidney there is a cyst  measures 4.3 x 4 cm.  IMPRESSION:  1.  Bilateral pleural effusions left greater than right with bilateral basilar posterior atelectasis. 2.  Bilateral nephrolithiasis.  Largest nonobstructive calcified calculus in mid pole of the left kidney measures 11 x 8.7 mm. Largest calculus in upper pole of the left kidney measures 9.7 mm. 3.  There is a calcified calculus in proximal right ureter measures 5 x  9 mm. Mild left perinephric stranding. 4.  There is a obstructive calcified calculus in proximal left ureter measures 10.4 x 8 mm.  Mild left hydronephrosis and mild left perinephric stranding. 5.  Atherosclerotic calcifications of the abdominal aorta and the iliac arteries.  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 6. Significant stool noted within rectum which is distended measures at least 7.6 cm in diameter suspicious for fecal impaction. 7.  There is a Foley catheter in a decompressed urinary bladder.  Original Report Authenticated By: Natasha Mead, M.D.   Ct Head Wo Contrast  08/04/2011  *RADIOLOGY REPORT*  Clinical Data: Dementia.  Stroke.  Mental status changes.  CT HEAD WITHOUT CONTRAST  Technique:  Contiguous axial images were obtained from the base of the skull through the vertex without contrast.  Comparison: 07/11/2010  Findings: The brain shows generalized  atrophy.  There is a old infarction in the left middle cerebral artery territory which has progressed atrophy encephalomalacia, similar to the previous study. No sign of acute infarction, mass lesion, hemorrhage, hydrocephalus or extra-axial collection.  The calvarium is unremarkable.  There are mucosal inflammatory changes of the maxillary and ethmoid sinuses.  IMPRESSION: No acute intracranial finding.  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  Some mucosal inflammation of the paranasal sinuses.  Original Report Authenticated By: Thomasenia Sales, M.D.   US Renal Port  08/06/2011  *RADIOLOGY REPORT*  Clinical Data: Hydronephrosis, renal failure.  RENAL/URINARY TRACT ULTRASOUND COMPLETE  Comparison:  CT 09/19/2010  Findings:  Right Kidney:  10.6 cm.  Lower pole not well visualized due to overlying bowel gas.  No hydronephrosis.  Left Kidney:  13.6 cm.  There is mild left hydronephrosis. Elongated echogenic area noted within the proximal left ureter just below the ureteropelvic junction, presumably obstructing stone. 4.6 cm exophytic cyst off the upper pole.  Bladder:  Foley catheter in place.  The bladder is decompressed.  IMPRESSION: Mild left hydronephrosis.  There appears to be an obstructing proximal left ureteral stone.  Original Report Authenticated By: Cyndie Chime, M.D.   Dg Chest Portable 1 View  08/04/2011  *RADIOLOGY REPORT*  Clinical Data: Shortness of breath.  Diabetes.  Chronic bronchitis.  PORTABLE CHEST - 1 VIEW  Comparison: 09/25/2010.  Findings: Normal sized heart.  Clear lungs.  Mild osteopenia and bilateral shoulder degenerative changes.  Thoracic spine degenerative changes and mild scoliosis.  IMPRESSION: No acute abnormality.  Original Report Authenticated By: Darrol Angel, M.D.   Dg Abd 2 Views  08/04/2011  *RADIOLOGY REPORT*  Clinical Data: Abdominal pain.  ABDOMEN - 2 VIEW  Comparison: Portable chest obtained earlier today.  Findings: Mildly dilated small bowel loops in the right mid  to upper abdomen.  Mildly prominent colon filled with gas and stool. Prominent stool in the rectum.  No free peritoneal air.  Lumbar and lower thoracic spine degenerative changes.  Faintly visualized left renal calculi, better seen on the CT dated 09/08/2010.  Previously demonstrated right renal calculi are obscured by the bowel.  Right inferior pelvic phlebolith.  IMPRESSION:  1.  Mild small bowel ileus or partial obstruction. 2.  Mildly prominent stool in the rectum. 3.  Left renal calculi.  Original Report Authenticated By: Darrol Angel, M.D.    Medications: I have reviewed the patient's current medications. Scheduled Meds:    . ipratropium  0.5 mg Nebulization Q4H   And  . albuterol  2.5 mg Nebulization Q4H  . aspirin  81 mg Oral q morning - 10a  . citalopram  20 mg Oral QHS  . enoxaparin  40 mg Subcutaneous Q24H  . ertapenem  1 g Intravenous Q24H  . famotidine  20 mg Oral QHS  . ferrous sulfate  325 mg Oral Q breakfast  . Fluticasone-Salmeterol  1 puff Inhalation BID  . insulin aspart  0-9 Units Subcutaneous Q4H  . insulin glargine  5 Units Subcutaneous QHS  . mulitivitamin with minerals  1 tablet Oral Daily  . simvastatin  40 mg Oral QHS  . sodium chloride  1,000 mL Intravenous Once  . sodium chloride  3 mL Intravenous Q12H  . thiamine  100 mg Oral Daily  . DISCONTD: antiseptic oral rinse  15 mL Mouth Rinse q12n4p  . DISCONTD: chlorhexidine  15 mL Mouth Rinse BID  . DISCONTD: enoxaparin  30 mg Subcutaneous Q24H  . DISCONTD: imipenem-cilastatin  250 mg Intravenous Q6H   Continuous Infusions:    . sodium chloride 1,000 mL (08/10/11 0823)   PRN Meds:.acetaminophen, acetaminophen, alum & mag hydroxide-simeth, HYDROmorphone, LORazepam, ondansetron (ZOFRAN) IV, ondansetron, oxyCODONE, zolpidem Assessment/Plan: Patient Active Hospital Problem List: Sepsis (08/04/2011)   Assessment: Sepsis physiology resolved. PT now has stable BP and HR and appears non-toxic. Pt presently on  Primaxin day#3 ad Vancomycin Day#6    Plan: I've discussed this patient with ID and in anticipation of going to SNF will change to [facility] and place PICC line.  Obstructive chronic bronchitis with exacerbation ()   Assessment: Quiescent    Type II or unspecified type diabetes mellitus without mention of complication, not stated as uncontrolled ()   Assessment:  Blood sugars adequately controlled   Plan: Continue SSI  Acute renal failure) (08/04/2011)   Assessment: Pt has a baseline Cr of 1.4. Presently at 2.1   Plan: Continue Hydration.   Urinary retention (08/04/2011)   Assessment: Will defer to Urology   Fever (08/04/2011)   Assessment: Pt has been afebrile for last 12 hours    Leukocytosis (08/04/2011)   Assessment: Still elevated. Will continue to monitor.    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    Plan: Defer to Urology  Fecal Impaction (08/08/2011)   Assessment: Relieved after enema.  Anticipate D/C to SNF in 24-48 hours.   LOS: 6 days

## 2011-08-10 NOTE — Progress Notes (Signed)
Patient ID: Justin Sherman, male   DOB: 1941/02/07, 71 y.o.   MRN: [id number]  Called by nurse at approximately 7:05pm who reports that pt was in pain from his abdomen and that within the last hour, pt had developed a markedly distended abdomen and was writhing in pain after not requiring any pain medication since last night. Pt had a small bowel movement today  Objective: Focused examination Gen: Pt writhing in pain and abdomen visibly distended. In acute distress.Audible wheezing. Afebrile, BP: 164/91, HR:80, RR:20 Lungs: Mild wheezing diffusely, No ronchi. CVS: RRR Abdomen: Markedly distended,  Diffusely tender, (+) rebound, (+) guarding, Tympanitic bowel sounds. G-U: foley draining  Assessment/Plan Pt with acute abdominal changes clinically. Will obtain Acute abdominal series to evaluate for perforated viscous. I have spoken with Dr. Abbey Chatters  Who is presently in a surgical procedure and he agrees with the acute abdominal series. He also recommends that if the X-ray shows no perforation then proceed with a CT of abdomen and pelvis with oral contrast. I will also obtain stat CBC with diff, CMET, Lactic acid and Procalcitonin.Continue with Pincus Sanes which has good abdominal coverage.  I have spoken with Hassell Done the patient's sister-in-law and legal guardian and updated her on patient's condition. She ca be reached at [phone]. She has requested to be called with updates on the patient's condition.

## 2011-08-11 DIAGNOSIS — R7881 Bacteremia: Secondary | ICD-10-CM

## 2011-08-11 DIAGNOSIS — R339 Retention of urine, unspecified: Secondary | ICD-10-CM

## 2011-08-11 DIAGNOSIS — R109 Unspecified abdominal pain: Secondary | ICD-10-CM

## 2011-08-11 DIAGNOSIS — N179 Acute kidney failure, unspecified: Secondary | ICD-10-CM

## 2011-08-11 LAB — GLUCOSE, CAPILLARY
Glucose-Capillary: 88 mg/dL (ref 70–99)
Glucose-Capillary: 102 mg/dL — ABNORMAL HIGH (ref 70–99)

## 2011-08-11 MED ORDER — SODIUM CHLORIDE 0.9 % IV SOLN: 1000.0000 mL | INTRAVENOUS | Status: DC

## 2011-08-11 MED ORDER — FUROSEMIDE 10 MG/ML IJ SOLN
20.0000 mg | Freq: Once | INTRAMUSCULAR | Status: AC
  Administered 2011-08-11: 20 mg via INTRAVENOUS
  Filled 2011-08-11: qty 2

## 2011-08-11 MED ORDER — INSULIN ASPART 100 UNIT/ML ~~LOC~~ SOLN: 0.0000 [IU] | Freq: Three times a day (TID) | SUBCUTANEOUS | Status: DC

## 2011-08-11 MED ORDER — INSULIN ASPART 100 UNIT/ML ~~LOC~~ SOLN: 0.0000 [IU] | Freq: Every day | SUBCUTANEOUS | Status: DC

## 2011-08-11 NOTE — Progress Notes (Signed)
4 Days Post-Op Subjective: Patient reports  No complaints Objective: Vital signs in last 24 hours: Temp:  [98.3 F (36.8 C)-99.4 F (37.4 C)] 98.3 F (36.8 C) (05/04 0605) Pulse Rate:  [80-87] 82  (05/04 0605) Resp:  [18-22] 19  (05/04 0605) BP: (117-164)/(69-91) 117/69 mmHg (05/04 0605) SpO2:  [92 %-94 %] 93 % (05/04 0605)  Intake/Output from previous day: 05/03 0701 - 05/04 0700 In: 840 [P.O.:840] Out: 5226 [Urine:5225; Stool:1] Intake/Output this shift:    Physical Exam:  General:no distress GI: not done and soft, non tender, normal bowel sounds, no palpable masses, no organomegaly, no inguinal hernia Male genitalia: not done Penis: normal, no lesions foley in meatus  Lab Results:  Basename 08/10/11 2025 08/10/11 0425 08/09/11 0346  HGB 7.5* 7.2* 7.2*  HCT 23.7* 22.5* 22.4*   BMET  Basename 08/10/11 2025 08/10/11 0425  NA 137 136  K 4.1 3.6  CL 104 106  CO2 23 21  GLUCOSE 108* 100*  BUN 24* 27*  CREATININE 2.01* 2.11*  CALCIUM 8.9 8.7    Basename 08/10/11 2025  LABPT --  INR 1.09   No results found for this basename: LABURIN:1 in the last 72 hours Results for orders placed during the hospital encounter of 08/04/11  CULTURE, BLOOD (ROUTINE X 2)     Status: Normal   Collection Time   08/04/11  3:00 PM      Component Value Range Status Comment   Specimen Description BLOOD LEFT WRIST  5 ML IN [facility] BOTTLE   Final    Special Requests Normal   Final    Culture  Setup Time [id number]   Final    Culture     Final    Value: KLEBSIELLA PNEUMONIAE     Note: SUSCEPTIBILITIES PERFORMED ON PREVIOUS CULTURE WITHIN THE LAST 5 DAYS.     Note: Gram Stain Report Called to,Read Back By and Verified With: RN A. ARNOLD ON 08/05/11 AT 1630 BY TEDAR   Report Status 08/07/2011 FINAL   Final   CULTURE, BLOOD (ROUTINE X 2)     Status: Normal   Collection Time   08/04/11  3:55 PM      Component Value Range Status Comment   Specimen Description BLOOD RIGHT HAND  5 ML IN [facility]  BOTTLE   Final    Special Requests Normal   Final    Culture  Setup Time [id number]   Final    Culture     Final    Value: KLEBSIELLA PNEUMONIAE     Note: Confirmed Extended Spectrum Beta-Lactamase Producer (ESBL)     Note: Gram Stain Report Called to,Read Back By and Verified With: RN A. ARNOLD ON 08/05/11 AT 1630 BY TEDAR   Report Status 08/07/2011 FINAL   Final    Organism ID, Bacteria KLEBSIELLA PNEUMONIAE   Final   URINE CULTURE     Status: Normal   Collection Time   08/04/11  8:24 PM      Component Value Range Status Comment   Specimen Description URINE, CATHETERIZED   Final    Special Requests Normal   Final    Culture  Setup Time [id number]   Final    Colony Count 75,000 COLONIES/ML   Final    Culture     Final    Value: Multiple bacterial morphotypes present, none predominant. Suggest appropriate recollection if clinically indicated.   Report Status 08/05/2011 FINAL   Final   MRSA PCR SCREENING     Status:  Normal   Collection Time   08/04/11  9:48 PM      Component Value Range Status Comment   MRSA by PCR NEGATIVE  NEGATIVE  Final     Studies/Results: Ct Abdomen Pelvis Wo Contrast  08/10/2011  *RADIOLOGY REPORT*  Clinical Data: 71 year old male with abdominal pain and distention. Recent obstructive uropathy.  CT ABDOMEN AND PELVIS WITHOUT CONTRAST  Technique:  Multidetector CT imaging of the abdomen and pelvis was performed following the standard protocol without intravenous contrast.  Comparison: CT abdomen pelvis 08/07/2011.  Findings: Increased layering bilateral pleural effusions, moderate on the left.  Associated compressive atelectasis.  No pericardial effusion.  Stable visualized osseous structures.  Continued stool ball in the rectum.  Presacral stranding / fluid re- identified.  No definite rectal wall thickening.  Sigmoid colon and left colon decompressed.  Transverse colon is decompressed.  Right colon is redundant with retained stool.  Cecum in the midline.  Negative  appendix.  No dilated small bowel.  Oral contrast 93 the terminal ileum.  Stomach and duodenum within normal limits.  Stable noncontrast liver, gallbladder, spleen, pancreas and adrenal glands.  Interval placement of bilateral double-J ureteral stents. On the left, one of the triangular lower pole calculi has moved closer to the renal pelvis.  The recently seen an oval obstructing calculus at the left UPJ is no longer identified.  There may be a small calculus adjacent to the stent in the proximal left ureter on series 2 image 43.  The left renal collecting system has slightly decompressed.  Left perinephric stranding has mildly diminished. The distal left ureter remains decompressed.  The distal left stent is appropriately placed. On the right, no definite residual intrarenal calculus is identified.  No definite calculus along the course of the right stent, motion artifact is more severe on the right and degrades visualization.  The right perinephric stranding and renal collecting systems size are stable.  The proximal right ureter has decompressed.  The distal right pigtail is appropriately placed.  There is a Foley catheter in the bladder but the bladder remains distended.  Small volume retroperitoneal free fluid primarily at the pelvic inlet.  IMPRESSION: 1.  Interval placement of bilateral double J ureteral stents with no adverse features.  Right intrarenal and ureteral calculi no longer identified.  Previously obstructing left ureteral calculus no longer identified.  One of the residual left intrarenal calculi has moved closer to the left renal pelvis.  2. Foley catheter in the bladder abut the bladder is distended. Clinical correlation recommended. 3.  Increased pleural effusions, now moderate on the left. 4.  Continued stool ball in the rectum suspicious for fecal impaction.  Presacral stranding may indicate mild proctitis.  Original Report Authenticated By: Harley Hallmark, M.D.   Dg Abd Acute  W/chest  08/10/2011  *RADIOLOGY REPORT*  Clinical Data: 71 year old male with abdominal pain and distention.  ACUTE ABDOMEN SERIES (ABDOMEN 2 VIEW & CHEST 1 VIEW)  Comparison: CT abdomen pelvis 08/07/2011 and earlier.  Findings: Semi upright AP view the chest.  Left side PICC line in place, tip at the level of the cavoatrial junction.  Lower lung volumes.  Increased retrocardiac opacity.  No pneumothorax.  No pneumoperitoneum.  Crowding of chronic interstitial opacity.  No overt pulmonary edema.  Bilateral double J ureteral stent now in place.  No pneumoperitoneum.  Ureteral stents appear appropriately positioned. Mildly distended large bowel with gas.  Gas continues to the level of the distal descending colon. No acute osseous  abnormality identified.  IMPRESSION: 1. Nonobstructed bowel gas pattern, no free air. 2.  Lower lung volumes with retrocardiac atelectasis or consolidation. 3.  Bilateral ureteral stent now in place, appear properly position. 4.  Left PICC line in place, tip at the cavoatrial junction level.  Original Report Authenticated By: Harley Hallmark, M.D.    Assessment/Plan: Kidney Stones Status post cystoureterscopy and placement of double J stent by Dr. Patsi Sears. Foley remains intact. May remove when ok by medicine.    LOS: 7 days   [facility] 08/11/2011, 7:57 AM

## 2011-08-11 NOTE — Consult Note (Signed)
Reason for Consult:  Acute abdomen Referring Physician:   Dr. Norm Parcel is an 71 y.o. male.  HPI: He was admitted April 27 acute urinary retention and urosepsis. He has a urethral stricture and had to have a foley catheter placed by urology.He also had bilateral ureteral obstruction and had bilateral ureteral stents placed 3 days ago. He was transferred out of the intensive care unit recently and was doing well until about 6:00 yesterday evening when he started to complain of abdominal distention and severe pain. There is concern that he had an acute abdomen.  A CBC demonstrates a white blood cell count was relatively unchanged. We were asked to see him because of the concern for an acute abdomen. 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  Past Medical History  Diagnosis Date  . Alterations of sensations, late effect of cerebrovascular disease   . Acute conjunctivitis, unspecified   . Aortic aneurysm of unspecified site without mention of rupture   . Flaccid hemiplegia affecting dominant side   . Type II or unspecified type diabetes mellitus without mention of complication, not stated as uncontrolled   . Depressive disorder, not elsewhere classified   . Unspecified essential hypertension   . Obstructive chronic bronchitis with exacerbation   . Anxiety state, unspecified   . Esophageal reflux   . Other and unspecified hyperlipidemia   . Pressure ulcer, other site   . Dementia     Past Surgical History  Procedure Date  . Cystoscopy w/ ureteral stent placement 08/07/2011    Procedure: CYSTOSCOPY WITH RETROGRADE PYELOGRAM/URETERAL STENT PLACEMENT;  Surgeon: Kathi Ludwig, MD;  Location: [facility];  Service: Urology;  Laterality: Bilateral;  bilateral stent placement ureterosopy    History reviewed. No pertinent family history.  Social History:  reports that he has never smoked. He does not have any smokeless tobacco history on file. He reports that he does  not drink alcohol or use illicit drugs.  Allergies: No Known Allergies   Current facility-administered medications:0.9 %  sodium chloride infusion, 1,000 mL, Intravenous, Continuous, Richarda Overlie, MD, Last Rate: 100 mL/hr at 08/10/11 0823, 1,000 mL at 08/10/11 0823;  acetaminophen (TYLENOL) suppository 650 mg, 650 mg, Rectal, Q6H PRN, Ron Parker, MD, 650 mg at 08/05/11 1620;  acetaminophen (TYLENOL) tablet 650 mg, 650 mg, Oral, Q6H PRN, Ron Parker, MD, 650 mg at 08/07/11 1523 albuterol (PROVENTIL) (5 MG/ML) 0.5% nebulizer solution 2.5 mg, 2.5 mg, Nebulization, Q4H, Ron Parker, MD, 2.5 mg at 08/10/11 2323;  alum & mag hydroxide-simeth (MAALOX/MYLANTA) 200-200-20 MG/5ML suspension 30 mL, 30 mL, Oral, Q6H PRN, Ron Parker, MD;  aspirin chewable tablet 81 mg, 81 mg, Oral, q morning - 10a, Altha Harm, MD, 81 mg at 08/10/11 1010 citalopram (CELEXA) tablet 20 mg, 20 mg, Oral, QHS, Altha Harm, MD, 20 mg at 08/10/11 2318;  enoxaparin (LOVENOX) injection 40 mg, 40 mg, Subcutaneous, Q24H, Altha Harm, MD, 40 mg at 08/10/11 2319;  ertapenem [facility]) 1 g in sodium chloride 0.9 % 50 mL IVPB, 1 g, Intravenous, Q24H, [facility], RPH, 1 g at 08/10/11 1650;  famotidine (PEPCID) tablet 20 mg, 20 mg, Oral, QHS, Altha Harm, MD, 20 mg at 08/10/11 2319 ferrous sulfate tablet 325 mg, 325 mg, Oral, Q breakfast, Altha Harm, MD, 325 mg at 08/10/11 7829;  Fluticasone-Salmeterol (ADVAIR) 250-50 MCG/DOSE inhaler 1 puff, 1 puff, Inhalation, BID, Altha Harm, MD, 1 puff at 08/10/11 2013;  HYDROmorphone (DILAUDID) injection 0.5-1 mg, 0.5-1 mg, Intravenous, Q3H PRN, Ron Parker, MD, 1 mg at 08/10/11 1748 insulin aspart (novoLOG) injection 0-9 Units, 0-9 Units, Subcutaneous, Q4H, Ron Parker, MD, 2 Units at 08/09/11 1635;  insulin glargine (LANTUS) injection 5 Units, 5 Units, Subcutaneous, QHS, Altha Harm, MD, 5 Units at  08/10/11 2323;  ipratropium (ATROVENT) nebulizer solution 0.5 mg, 0.5 mg, Nebulization, Q4H, Ron Parker, MD, 0.5 mg at 08/10/11 2323 LORazepam (ATIVAN) tablet 0.5 mg, 0.5 mg, Oral, Daily PRN, Altha Harm, MD;  mulitivitamin with minerals tablet 1 tablet, 1 tablet, Oral, Daily, Altha Harm, MD, 1 tablet at 08/10/11 1010;  ondansetron (ZOFRAN) injection 4 mg, 4 mg, Intravenous, Q6H PRN, Ron Parker, MD;  ondansetron (ZOFRAN) tablet 4 mg, 4 mg, Oral, Q6H PRN, Ron Parker, MD oxyCODONE (Oxy IR/ROXICODONE) immediate release tablet 5 mg, 5 mg, Oral, Q4H PRN, Ron Parker, MD, 5 mg at 08/09/11 0002;  simvastatin (ZOCOR) tablet 40 mg, 40 mg, Oral, QHS, Altha Harm, MD, 40 mg at 08/10/11 2320;  sodium chloride 0.9 % bolus 1,000 mL, 1,000 mL, Intravenous, Once, Meagan Hunt, MD;  sodium chloride 0.9 % injection 10-40 mL, 10-40 mL, Intracatheter, PRN, Altha Harm, MD sodium chloride 0.9 % injection 3 mL, 3 mL, Intravenous, Q12H, Ron Parker, MD, 3 mL at 08/09/11 0957;  thiamine (VITAMIN B-1) tablet 100 mg, 100 mg, Oral, Daily, Altha Harm, MD, 100 mg at 08/10/11 1010;  zolpidem (AMBIEN) tablet 5 mg, 5 mg, Oral, QHS PRN, Ron Parker, MD;  DISCONTD: antiseptic oral rinse (BIOTENE) solution 15 mL, 15 mL, Mouth Rinse, q12n4p, Richarda Overlie, MD, 15 mL at 08/09/11 1634 DISCONTD: chlorhexidine (PERIDEX) 0.12 % solution 15 mL, 15 mL, Mouth Rinse, BID, Richarda Overlie, MD, 15 mL at 08/09/11 0957;  DISCONTD: enoxaparin (LOVENOX) injection 30 mg, 30 mg, Subcutaneous, Q24H, Richarda Overlie, MD, 30 mg at 08/09/11 2152;  DISCONTD: imipenem-cilastatin (PRIMAXIN) 250 mg in sodium chloride 0.9 % 100 mL IVPB, 250 mg, Intravenous, Q6H, Thuyvan Thi Phan, PHARMD, 250 mg at 08/10/11 1010 Results for orders placed during the hospital encounter of 08/04/11 (from the past 48 hour(s))  GLUCOSE, CAPILLARY     Status: Abnormal   Collection Time   08/09/11 12:10 AM       Component Value Range Comment   Glucose-Capillary 124 (*) 70 - 99 (mg/dL)   CBC     Status: Abnormal   Collection Time   08/09/11  3:46 AM      Component Value Range Comment   WBC 13.5 (*) 4.0 - 10.5 (K/uL)    RBC 2.59 (*) 4.22 - 5.81 (MIL/uL)    Hemoglobin 7.2 (*) 13.0 - 17.0 (g/dL)    HCT 45.4 (*) 09.8 - 52.0 (%)    MCV 86.5  78.0 - 100.0 (fL)    MCH 27.8  26.0 - 34.0 (pg)    MCHC 32.1  30.0 - 36.0 (g/dL)    RDW 11.9  14.7 - 82.9 (%)    Platelets 180  150 - 400 (K/uL)   DIFFERENTIAL     Status: Abnormal   Collection Time   08/09/11  3:46 AM      Component Value Range Comment   Neutrophils Relative 76  43 - 77 (%)    Lymphocytes Relative 12  12 - 46 (%)    Monocytes Relative 7  3 - 12 (%)    Eosinophils Relative 4  0 - 5 (%)  Basophils Relative 1  0 - 1 (%)    Neutro Abs 10.4 (*) 1.7 - 7.7 (K/uL)    Lymphs Abs 1.6  0.7 - 4.0 (K/uL)    Monocytes Absolute 0.9  0.1 - 1.0 (K/uL)    Eosinophils Absolute 0.5  0.0 - 0.7 (K/uL)    Basophils Absolute 0.1  0.0 - 0.1 (K/uL)    Smear Review MORPHOLOGY UNREMARKABLE     BASIC METABOLIC PANEL     Status: Abnormal   Collection Time   08/09/11  3:46 AM      Component Value Range Comment   Sodium 136  135 - 145 (mEq/L)    Potassium 3.6  3.5 - 5.1 (mEq/L)    Chloride 107  96 - 112 (mEq/L)    CO2 22  19 - 32 (mEq/L)    Glucose, Bld 107 (*) 70 - 99 (mg/dL)    BUN 31 (*) 6 - 23 (mg/dL)    Creatinine, Ser 9.60 (*) 0.50 - 1.35 (mg/dL)    Calcium 8.8  8.4 - 10.5 (mg/dL)    GFR calc non Af Amer 25 (*) >90 (mL/min)    GFR calc Af Amer 29 (*) >90 (mL/min)   HEMOGLOBIN A1C     Status: Abnormal   Collection Time   08/09/11  3:46 AM      Component Value Range Comment   Hemoglobin A1C 6.0 (*) <5.7 (%)    Mean Plasma Glucose 126 (*) <117 (mg/dL)   GLUCOSE, CAPILLARY     Status: Abnormal   Collection Time   08/09/11  4:20 AM      Component Value Range Comment   Glucose-Capillary 108 (*) 70 - 99 (mg/dL)    Comment 1 Notify RN     GLUCOSE, CAPILLARY      Status: Normal   Collection Time   08/09/11  7:47 AM      Component Value Range Comment   Glucose-Capillary 90  70 - 99 (mg/dL)    Comment 1 Documented in Chart      Comment 2 Notify RN     GLUCOSE, CAPILLARY     Status: Normal   Collection Time   08/09/11 12:12 PM      Component Value Range Comment   Glucose-Capillary 91  70 - 99 (mg/dL)   GLUCOSE, CAPILLARY     Status: Abnormal   Collection Time   08/09/11  4:03 PM      Component Value Range Comment   Glucose-Capillary 157 (*) 70 - 99 (mg/dL)    Comment 1 Notify RN      Comment 2 Documented in Chart     GLUCOSE, CAPILLARY     Status: Normal   Collection Time   08/09/11  8:15 PM      Component Value Range Comment   Glucose-Capillary 76  70 - 99 (mg/dL)   GLUCOSE, CAPILLARY     Status: Abnormal   Collection Time   08/10/11  4:07 AM      Component Value Range Comment   Glucose-Capillary 101 (*) 70 - 99 (mg/dL)   CBC     Status: Abnormal   Collection Time   08/10/11  4:25 AM      Component Value Range Comment   WBC 15.9 (*) 4.0 - 10.5 (K/uL)    RBC 2.62 (*) 4.22 - 5.81 (MIL/uL)    Hemoglobin 7.2 (*) 13.0 - 17.0 (g/dL)    HCT 45.4 (*) 09.8 - 52.0 (%)    MCV 85.9  78.0 - 100.0 (fL)    MCH 27.5  26.0 - 34.0 (pg)    MCHC 32.0  30.0 - 36.0 (g/dL)    RDW 16.1  09.6 - 04.5 (%)    Platelets 251  150 - 400 (K/uL)   DIFFERENTIAL     Status: Abnormal   Collection Time   08/10/11  4:25 AM      Component Value Range Comment   Neutrophils Relative 78 (*) 43 - 77 (%)    Lymphocytes Relative 11 (*) 12 - 46 (%)    Monocytes Relative 6  3 - 12 (%)    Eosinophils Relative 4  0 - 5 (%)    Basophils Relative 1  0 - 1 (%)    Neutro Abs 12.4 (*) 1.7 - 7.7 (K/uL)    Lymphs Abs 1.7  0.7 - 4.0 (K/uL)    Monocytes Absolute 1.0  0.1 - 1.0 (K/uL)    Eosinophils Absolute 0.6  0.0 - 0.7 (K/uL)    Basophils Absolute 0.2 (*) 0.0 - 0.1 (K/uL)    RBC Morphology ELLIPTOCYTES   MICROCYTES   WBC Morphology MILD LEFT SHIFT (1-5% METAS, OCC MYELO, OCC BANDS)       Smear Review PLATELET COUNT CONFIRMED BY SMEAR     BASIC METABOLIC PANEL     Status: Abnormal   Collection Time   08/10/11  4:25 AM      Component Value Range Comment   Sodium 136  135 - 145 (mEq/L)    Potassium 3.6  3.5 - 5.1 (mEq/L)    Chloride 106  96 - 112 (mEq/L)    CO2 21  19 - 32 (mEq/L)    Glucose, Bld 100 (*) 70 - 99 (mg/dL)    BUN 27 (*) 6 - 23 (mg/dL)    Creatinine, Ser 4.09 (*) 0.50 - 1.35 (mg/dL)    Calcium 8.7  8.4 - 10.5 (mg/dL)    GFR calc non Af Amer 29 (*) >90 (mL/min)    GFR calc Af Amer 34 (*) >90 (mL/min)   GLUCOSE, CAPILLARY     Status: Normal   Collection Time   08/10/11  7:33 AM      Component Value Range Comment   Glucose-Capillary 99  70 - 99 (mg/dL)    Comment 1 Notify RN      Comment 2 Documented in Chart     GLUCOSE, CAPILLARY     Status: Abnormal   Collection Time   08/10/11 11:45 AM      Component Value Range Comment   Glucose-Capillary 110 (*) 70 - 99 (mg/dL)   GLUCOSE, CAPILLARY     Status: Abnormal   Collection Time   08/10/11  4:36 PM      Component Value Range Comment   Glucose-Capillary 105 (*) 70 - 99 (mg/dL)    Comment 1 Notify RN      Comment 2 Documented in Chart     CBC     Status: Abnormal   Collection Time   08/10/11  8:25 PM      Component Value Range Comment   WBC 15.7 (*) 4.0 - 10.5 (K/uL)    RBC 2.75 (*) 4.22 - 5.81 (MIL/uL)    Hemoglobin 7.5 (*) 13.0 - 17.0 (g/dL)    HCT 81.1 (*) 91.4 - 52.0 (%)    MCV 86.2  78.0 - 100.0 (fL)    MCH 27.3  26.0 - 34.0 (pg)    MCHC 31.6  30.0 - 36.0 (g/dL)  RDW 15.2  11.5 - 15.5 (%)    Platelets 285  150 - 400 (K/uL)   DIFFERENTIAL     Status: Abnormal   Collection Time   08/10/11  8:25 PM      Component Value Range Comment   Neutrophils Relative 82 (*) 43 - 77 (%)    Lymphocytes Relative 8 (*) 12 - 46 (%)    Monocytes Relative 6  3 - 12 (%)    Eosinophils Relative 4  0 - 5 (%)    Basophils Relative 0  0 - 1 (%)    Neutro Abs 12.9 (*) 1.7 - 7.7 (K/uL)    Lymphs Abs 1.3  0.7 - 4.0 (K/uL)     Monocytes Absolute 0.9  0.1 - 1.0 (K/uL)    Eosinophils Absolute 0.6  0.0 - 0.7 (K/uL)    Basophils Absolute 0.0  0.0 - 0.1 (K/uL)    RBC Morphology POLYCHROMASIA PRESENT   OVALOCYTES   WBC Morphology MILD LEFT SHIFT (1-5% METAS, OCC MYELO, OCC BANDS)     COMPREHENSIVE METABOLIC PANEL     Status: Abnormal   Collection Time   08/10/11  8:25 PM      Component Value Range Comment   Sodium 137  135 - 145 (mEq/L)    Potassium 4.1  3.5 - 5.1 (mEq/L)    Chloride 104  96 - 112 (mEq/L)    CO2 23  19 - 32 (mEq/L)    Glucose, Bld 108 (*) 70 - 99 (mg/dL)    BUN 24 (*) 6 - 23 (mg/dL)    Creatinine, Ser 1.61 (*) 0.50 - 1.35 (mg/dL)    Calcium 8.9  8.4 - 10.5 (mg/dL)    Total Protein 6.3  6.0 - 8.3 (g/dL)    Albumin 2.3 (*) 3.5 - 5.2 (g/dL)    AST 25  0 - 37 (U/L)    ALT 21  0 - 53 (U/L)    Alkaline Phosphatase 64  39 - 117 (U/L)    Total Bilirubin 0.2 (*) 0.3 - 1.2 (mg/dL)    GFR calc non Af Amer 31 (*) >90 (mL/min)    GFR calc Af Amer 36 (*) >90 (mL/min)   LACTIC ACID, PLASMA     Status: Normal   Collection Time   08/10/11  8:25 PM      Component Value Range Comment   Lactic Acid, Venous 0.6  0.5 - 2.2 (mmol/L)   PROCALCITONIN     Status: Normal   Collection Time   08/10/11  8:25 PM      Component Value Range Comment   Procalcitonin 2.02     APTT     Status: Abnormal   Collection Time   08/10/11  8:25 PM      Component Value Range Comment   aPTT 39 (*) 24 - 37 (seconds)   PROTIME-INR     Status: Normal   Collection Time   08/10/11  8:25 PM      Component Value Range Comment   Prothrombin Time 14.3  11.6 - 15.2 (seconds)    INR 1.09  0.00 - 1.49    GLUCOSE, CAPILLARY     Status: Abnormal   Collection Time   08/10/11  9:00 PM      Component Value Range Comment   Glucose-Capillary 107 (*) 70 - 99 (mg/dL)    Comment 1 Notify RN      Comment 2 Documented in Chart       Ct Abdomen  Pelvis Wo Contrast  08/10/2011  *RADIOLOGY REPORT*  Clinical Data: 71 year old male with abdominal pain and  distention. Recent obstructive uropathy.  CT ABDOMEN AND PELVIS WITHOUT CONTRAST  Technique:  Multidetector CT imaging of the abdomen and pelvis was performed following the standard protocol without intravenous contrast.  Comparison: CT abdomen pelvis 08/07/2011.  Findings: Increased layering bilateral pleural effusions, moderate on the left.  Associated compressive atelectasis.  No pericardial effusion.  Stable visualized osseous structures.  Continued stool ball in the rectum.  Presacral stranding / fluid re- identified.  No definite rectal wall thickening.  Sigmoid colon and left colon decompressed.  Transverse colon is decompressed.  Right colon is redundant with retained stool.  Cecum in the midline.  Negative appendix.  No dilated small bowel.  Oral contrast 93 the terminal ileum.  Stomach and duodenum within normal limits.  Stable noncontrast liver, gallbladder, spleen, pancreas and adrenal glands.  Interval placement of bilateral double-J ureteral stents. On the left, one of the triangular lower pole calculi has moved closer to the renal pelvis.  The recently seen an oval obstructing calculus at the left UPJ is no longer identified.  There may be a small calculus adjacent to the stent in the proximal left ureter on series 2 image 43.  The left renal collecting system has slightly decompressed.  Left perinephric stranding has mildly diminished. The distal left ureter remains decompressed.  The distal left stent is appropriately placed. On the right, no definite residual intrarenal calculus is identified.  No definite calculus along the course of the right stent, motion artifact is more severe on the right and degrades visualization.  The right perinephric stranding and renal collecting systems size are stable.  The proximal right ureter has decompressed.  The distal right pigtail is appropriately placed.  There is a Foley catheter in the bladder but the bladder remains distended.  Small volume retroperitoneal  free fluid primarily at the pelvic inlet.  IMPRESSION: 1.  Interval placement of bilateral double J ureteral stents with no adverse features.  Right intrarenal and ureteral calculi no longer identified.  Previously obstructing left ureteral calculus no longer identified.  One of the residual left intrarenal calculi has moved closer to the left renal pelvis.  2. Foley catheter in the bladder abut the bladder is distended. Clinical correlation recommended. 3.  Increased pleural effusions, now moderate on the left. 4.  Continued stool ball in the rectum suspicious for fecal impaction.  Presacral stranding may indicate mild proctitis.  Original Report Authenticated By: Harley Hallmark, M.D.   Dg Abd Acute W/chest  08/10/2011  *RADIOLOGY REPORT*  Clinical Data: 71 year old male with abdominal pain and distention.  ACUTE ABDOMEN SERIES (ABDOMEN 2 VIEW & CHEST 1 VIEW)  Comparison: CT abdomen pelvis 08/07/2011 and earlier.  Findings: Semi upright AP view the chest.  Left side PICC line in place, tip at the level of the cavoatrial junction.  Lower lung volumes.  Increased retrocardiac opacity.  No pneumothorax.  No pneumoperitoneum.  Crowding of chronic interstitial opacity.  No overt pulmonary edema.  Bilateral double J ureteral stent now in place.  No pneumoperitoneum.  Ureteral stents appear appropriately positioned. Mildly distended large bowel with gas.  Gas continues to the level of the distal descending colon. No acute osseous abnormality identified.  IMPRESSION: 1. Nonobstructed bowel gas pattern, no free air. 2.  Lower lung volumes with retrocardiac atelectasis or consolidation. 3.  Bilateral ureteral stent now in place, appear properly position. 4.  Left PICC line  in place, tip at the cavoatrial junction level.  Original Report Authenticated By: Harley Hallmark, M.D.    Review of Systems  Unable to perform ROS  Blood pressure 146/74, pulse 85, temperature 99.4 F (37.4 C), temperature source Oral, resp. rate  20, height 5\' 10"  (1.778 m), weight 185 lb 6.5 oz (84.1 kg), SpO2 93.00%. Physical Exam  Constitutional:       Elderly male that appears to be confused.  Cardiovascular: Normal rate and regular rhythm.   Respiratory: He has wheezes.  GI: He exhibits distension. There is tenderness (lower abdomen with fullness). There is no rebound.  Genitourinary:       A Foley catheter is in place.   Bladder scan demonstrates 875 cc in his bladder. Assessment/Plan: Abdominal pain secondary to bladder distention and nonfunctioning Foley catheter. There was difficulty placing the Foley initially.  Plan: Would flush the Foley. If it is not functioning properly, he would need to be replaced.  Shawnta Zimbelman J 08/11/2011, 12:07 AM

## 2011-08-11 NOTE — Progress Notes (Signed)
Subjective: Pt seen at approximately 8:47 am  today Pt sleeping but arousable. States that he feels a whole lot better than last night. Denies any pain presently. Mild audible wheezing.  Interval History: Acute abdominal series shows no perforation. CT scan showed  A ball of stool in the rectal vault. Foley was non functioning and after flushing, revealed approximately 800 ml of urine after flush. Pt had resolution of abdominal distension after urine evacuated from bladder.   Objective: Filed Vitals:   08/10/11 2323 08/11/11 0401 08/11/11 0605 08/11/11 0807  BP:   117/69   Pulse: 85 82 82   Temp:   98.3 F (36.8 C)   TempSrc:   Oral   Resp: 20 18 19    Height:      Weight:      SpO2: 93% 94% 93% 95%   Weight change:   Intake/Output Summary (Last 24 hours) at 08/11/11 0847 Last data filed at 08/11/11 0829  Gross per 24 hour  Intake    795 ml  Output   6426 ml  Net  -5631 ml    General: Somnolent but arousable and oriented x3, in no acute distress.  HEENT: [place]/AT PEERL, EOMI Neck: Trachea midline,  no masses, no thyromegal,y no JVD, no carotid bruit OROPHARYNX:  Moist, No exudate/ erythema/lesions.  Heart: Regular rate and rhythm, without murmurs, rubs, gallops, PMI non-displaced, no heaves or thrills on palpation.  Lungs: Clear to auscultation, no wheezing or rhonchi noted. No increased vocal fremitus resonant to percussion  Abdomen: Non distended, soft, positive bowel sounds, no masses no hepatosplenomegaly noted..  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 Otherwise no focal neurological deficits noted cranial nerves II through XII grossly intact.  Musculoskeletal: No warm swelling or erythema around joints, no spinal tenderness noted.    Lab Results:  [facility] 08/10/11 2025 08/10/11 0425  NA 137 136  K 4.1 3.6  CL 104 106  CO2 23 21  GLUCOSE 108* 100*  BUN 24* 27*  CREATININE 2.01* 2.11*  CALCIUM 8.9 8.7  MG -- --  PHOS -- --     Basename 08/10/11 2025  AST 25  ALT 21  ALKPHOS 64  BILITOT 0.2*  PROT 6.3  ALBUMIN 2.3*   No results found for this basename: LIPASE:2,AMYLASE:2 in the last 72 hours  Basename 08/10/11 2025 08/10/11 0425  WBC 15.7* 15.9*  NEUTROABS 12.9* 12.4*  HGB 7.5* 7.2*  HCT 23.7* 22.5*  MCV 86.2 85.9  PLT 285 251   No results found for this basename: CKTOTAL:3,CKMB:3,CKMBINDEX:3,TROPONINI:3 in the last 72 hours No components found with this basename: POCBNP:3 No results found for this basename: DDIMER:2 in the last 72 hours  Basename 08/09/11 0346  HGBA1C 6.0*   No results found for this basename: CHOL:2,HDL:2,LDLCALC:2,TRIG:2,CHOLHDL:2,LDLDIRECT:2 in the last 72 hours No results found for this basename: TSH,T4TOTAL,FREET3,T3FREE,THYROIDAB in the last 72 hours No results found for this basename: VITAMINB12:2,FOLATE:2,FERRITIN:2,TIBC:2,IRON:2,RETICCTPCT:2 in the last 72 hours  Micro Results: Recent Results (from the past 240 hour(s))  CULTURE, BLOOD (ROUTINE X 2)     Status: Normal   Collection Time   08/04/11  3:00 PM      Component Value Range Status Comment   Specimen Description BLOOD LEFT WRIST  5 ML IN [facility] BOTTLE   Final    Special Requests Normal   Final    Culture  Setup Time [id number]   Final    Culture     Final    Value: KLEBSIELLA  PNEUMONIAE     Note: SUSCEPTIBILITIES PERFORMED ON PREVIOUS CULTURE WITHIN THE LAST 5 DAYS.     Note: Gram Stain Report Called to,Read Back By and Verified With: RN A. ARNOLD ON 08/05/11 AT 1630 BY TEDAR   Report Status 08/07/2011 FINAL   Final   CULTURE, BLOOD (ROUTINE X 2)     Status: Normal   Collection Time   08/04/11  3:55 PM      Component Value Range Status Comment   Specimen Description BLOOD RIGHT HAND  5 ML IN [facility] BOTTLE   Final    Special Requests Normal   Final    Culture  Setup Time [id number]   Final    Culture     Final    Value: KLEBSIELLA PNEUMONIAE     Note: Confirmed Extended Spectrum Beta-Lactamase Producer  (ESBL)     Note: Gram Stain Report Called to,Read Back By and Verified With: RN A. ARNOLD ON 08/05/11 AT 1630 BY TEDAR   Report Status 08/07/2011 FINAL   Final    Organism ID, Bacteria KLEBSIELLA PNEUMONIAE   Final   URINE CULTURE     Status: Normal   Collection Time   08/04/11  8:24 PM      Component Value Range Status Comment   Specimen Description URINE, CATHETERIZED   Final    Special Requests Normal   Final    Culture  Setup Time [id number]   Final    Colony Count 75,000 COLONIES/ML   Final    Culture     Final    Value: Multiple bacterial morphotypes present, none predominant. Suggest appropriate recollection if clinically indicated.   Report Status 08/05/2011 FINAL   Final   MRSA PCR SCREENING     Status: Normal   Collection Time   08/04/11  9:48 PM      Component Value Range Status Comment   MRSA by PCR NEGATIVE  NEGATIVE  Final     Studies/Results: Ct Abdomen Pelvis Wo Contrast  08/07/2011  *RADIOLOGY REPORT*  Clinical Data: Possible left ureteral stone with left hydronephrosis and acute renal failure  CT ABDOMEN AND PELVIS WITHOUT CONTRAST  Technique:  Multidetector CT imaging of the abdomen and pelvis was performed following the standard protocol without intravenous contrast.  Comparison: 09/19/2010  Findings: Lung bases shows small left pleural effusion with left lower lobe posterior atelectasis or infiltrate.  Trace right pleural effusion with right basilar atelectasis.  Sagittal images of the spine shows osteopenia and degenerative changes thoracolumbar spine.  Unenhanced liver is unremarkable.  Unenhanced spleen, pancreas and adrenal glands are unremarkable.  There is a nonobstructive calcified calculus mid pole of the right kidney measures 11 x 8.7 mm.  There is mild right perinephric stranding.  In axial image 47 there is 5 mm and 9 mm calcified calculus in the proximal right ureter at L3-L4 disc space.  There is mild left hydronephrosis.  Multiple calcified calculi are noted  in the upper pole of the left kidney the largest measures 9.7 mm.  Calcified calculus in mid pole of the left kidney measures 4.7 mm.  In axial image 43 there is a 10.4 by 8 mm calcified obstructive calculus in proximal left ureter at level of L3 vertebral body. There is  left perinephric stranding.  No calcified gallstones are noted within gallbladder. Atherosclerotic calcifications are noted abdominal aorta and the iliac arteries.  Bilateral distal ureter is unremarkable.  There is a Foley catheter in a decompressed urinary bladder.  Moderate  stool noted within rectum.  The rectum is distended measures 7.6 cm in diameter suspicious for fecal impaction.  No small bowel obstruction.  No pericecal inflammation.  No destructive bony lesions are noted within pelvis. 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  In upper pole of the left kidney there is a cyst measures 4.3 x 4 cm.  IMPRESSION:  1.  Bilateral pleural effusions left greater than right with bilateral basilar posterior atelectasis. 2.  Bilateral nephrolithiasis.  Largest nonobstructive calcified calculus in mid pole of the left kidney measures 11 x 8.7 mm. Largest calculus in upper pole of the left kidney measures 9.7 mm. 3.  There is a calcified calculus in proximal right ureter measures 5 x  9 mm. Mild left perinephric stranding. 4.  There is a obstructive calcified calculus in proximal left ureter measures 10.4 x 8 mm.  Mild left hydronephrosis and mild left perinephric stranding. 5.  Atherosclerotic calcifications of the abdominal aorta and the iliac arteries.  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 6. Significant stool noted within rectum which is distended measures at least 7.6 cm in diameter suspicious for fecal impaction. 7.  There is a Foley catheter in a decompressed urinary bladder.  Original Report Authenticated By: Natasha Mead, M.D.   Ct Head Wo Contrast  08/04/2011  *RADIOLOGY  REPORT*  Clinical Data: Dementia.  Stroke.  Mental status changes.  CT HEAD WITHOUT CONTRAST  Technique:  Contiguous axial images were obtained from the base of the skull through the vertex without contrast.  Comparison: 07/11/2010  Findings: The brain shows generalized atrophy.  There is a old infarction in the left middle cerebral artery territory which has progressed atrophy encephalomalacia, similar to the previous study. No sign of acute infarction, mass lesion, hemorrhage, hydrocephalus or extra-axial collection.  The calvarium is unremarkable.  There are mucosal inflammatory changes of the maxillary and ethmoid sinuses.  IMPRESSION: No acute intracranial finding.  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  Some mucosal inflammation of the paranasal sinuses.  Original Report Authenticated By: Thomasenia Sales, M.D.   US Renal Port  08/06/2011  *RADIOLOGY REPORT*  Clinical Data: Hydronephrosis, renal failure.  RENAL/URINARY TRACT ULTRASOUND COMPLETE  Comparison:  CT 09/19/2010  Findings:  Right Kidney:  10.6 cm.  Lower pole not well visualized due to overlying bowel gas.  No hydronephrosis.  Left Kidney:  13.6 cm.  There is mild left hydronephrosis. Elongated echogenic area noted within the proximal left ureter just below the ureteropelvic junction, presumably obstructing stone. 4.6 cm exophytic cyst off the upper pole.  Bladder:  Foley catheter in place.  The bladder is decompressed.  IMPRESSION: Mild left hydronephrosis.  There appears to be an obstructing proximal left ureteral stone.  Original Report Authenticated By: Cyndie Chime, M.D.   Dg Chest Portable 1 View  08/04/2011  *RADIOLOGY REPORT*  Clinical Data: Shortness of breath.  Diabetes.  Chronic bronchitis.  PORTABLE CHEST - 1 VIEW  Comparison: 09/25/2010.  Findings: Normal sized heart.  Clear lungs.  Mild osteopenia and bilateral shoulder degenerative changes.  Thoracic spine degenerative changes and mild scoliosis.  IMPRESSION: No acute abnormality.   Original Report Authenticated By: Darrol Angel, M.D.   Dg Abd 2 Views  08/04/2011  *RADIOLOGY REPORT*  Clinical Data: Abdominal pain.  ABDOMEN - 2 VIEW  Comparison: Portable chest obtained earlier today.  Findings: Mildly dilated small bowel loops in the right mid to upper abdomen.  Mildly prominent colon filled with gas and stool. Prominent stool in the rectum.  No free peritoneal air.  Lumbar and lower thoracic spine degenerative changes.  Faintly visualized left renal calculi, better seen on the CT dated 09/08/2010.  Previously demonstrated right renal calculi are obscured by the bowel.  Right inferior pelvic phlebolith.  IMPRESSION:  1.  Mild small bowel ileus or partial obstruction. 2.  Mildly prominent stool in the rectum. 3.  Left renal calculi.  Original Report Authenticated By: Darrol Angel, M.D.    Medications: I have reviewed the patient's current medications. Scheduled Meds:    . ipratropium  0.5 mg Nebulization Q4H   And  . albuterol  2.5 mg Nebulization Q4H  . aspirin  81 mg Oral q morning - 10a  . citalopram  20 mg Oral QHS  . enoxaparin  40 mg Subcutaneous Q24H  . ertapenem  1 g Intravenous Q24H  . famotidine  20 mg Oral QHS  . ferrous sulfate  325 mg Oral Q breakfast  . Fluticasone-Salmeterol  1 puff Inhalation BID  . insulin aspart  0-15 Units Subcutaneous TID WC  . insulin aspart  0-5 Units Subcutaneous QHS  . insulin glargine  5 Units Subcutaneous QHS  . mulitivitamin with minerals  1 tablet Oral Daily  . simvastatin  40 mg Oral QHS  . sodium chloride  1,000 mL Intravenous Once  . sodium chloride  3 mL Intravenous Q12H  . thiamine  100 mg Oral Daily  . DISCONTD: enoxaparin  30 mg Subcutaneous Q24H  . DISCONTD: imipenem-cilastatin  250 mg Intravenous Q6H  . DISCONTD: insulin aspart  0-9 Units Subcutaneous Q4H   Continuous Infusions:    . sodium chloride 1,000 mL (08/10/11 0823)   PRN Meds:.acetaminophen, acetaminophen, alum & mag hydroxide-simeth,  HYDROmorphone, LORazepam, ondansetron (ZOFRAN) IV, ondansetron, oxyCODONE, sodium chloride, zolpidem Assessment/Plan: Patient Active Hospital Problem List: Urinary retention (08/04/2011)   Assessment: Pt turned out to have retention of approximately 800 ml of urine most likely secondary to an occluded catheter tip. The patient is now having clear and adequate urine via catheter.    Plan: Will decrease IVF to [facility] and continue to monitor urine output through today. If no further complications, will plan to D/C to SNF tomorrow.  Sepsis (08/04/2011)   Assessment: Sepsis physiology resolved. PT now has stable BP and HR and appears non-toxic. Pt was treated with  Primaxin fro 3 days, and per discussion wit ID, has been changed to [employer] daily. He will continue on Invanz for a total of 14 days total of antibiotics.   Plan: I've discussed this patient with ID and in anticipation of going to SNF has been changed to Invanz and  PICC line inserted.  Obstructive chronic bronchitis with exacerbation ()   Assessment: Quiescent    Type II or unspecified type diabetes mellitus without mention of complication, not stated as uncontrolled ()   Assessment:  Blood sugars adequately controlled   Plan: Continue SSI  Acute renal failure) (08/04/2011)   Assessment: Pt has a baseline Cr of 1.4. Presently at 2.01   Plan: Continue Hydration.   Fever (08/04/2011)   Assessment: Pt has been afebrile for last 12 hours    Leukocytosis (08/04/2011)   Assessment: Still elevated. Will continue to monitor.    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    Plan: Defer to Urology  Fecal Impaction (  08/08/2011)   Assessment: Will give a soap suds enema secondary to fecal retention.  Anticipate D/C to SNF in 24-48 hours.  Spoke with Pt's guardian Ms Arcadio Cope and updated her on  Patient's condition and plan for D/C to SNF  tomorrow.   LOS: 7 days

## 2011-08-11 NOTE — Progress Notes (Signed)
Soap sud enema ordered performed. Non productive. Annitta Needs, RN

## 2011-08-12 DIAGNOSIS — R5081 Fever presenting with conditions classified elsewhere: Secondary | ICD-10-CM

## 2011-08-12 DIAGNOSIS — R7881 Bacteremia: Secondary | ICD-10-CM

## 2011-08-12 DIAGNOSIS — N179 Acute kidney failure, unspecified: Secondary | ICD-10-CM

## 2011-08-12 DIAGNOSIS — R339 Retention of urine, unspecified: Secondary | ICD-10-CM

## 2011-08-12 LAB — BASIC METABOLIC PANEL
BUN: 18 mg/dL (ref 6–23)
CO2: 26 mEq/L (ref 19–32)
Calcium: 9.1 mg/dL (ref 8.4–10.5)
Glucose, Bld: 88 mg/dL (ref 70–99)
Sodium: 136 mEq/L (ref 135–145)

## 2011-08-12 LAB — DIFFERENTIAL
Basophils Relative: 1 % (ref 0–1)
Eosinophils Relative: 4 % (ref 0–5)
Monocytes Absolute: 0.8 10*3/uL (ref 0.1–1.0)
Neutro Abs: 9.6 10*3/uL — ABNORMAL HIGH (ref 1.7–7.7)
Neutrophils Relative %: 76 % (ref 43–77)

## 2011-08-12 LAB — CBC
HCT: 22.6 % — ABNORMAL LOW (ref 39.0–52.0)
Hemoglobin: 7.2 g/dL — ABNORMAL LOW (ref 13.0–17.0)
MCH: 27.5 pg (ref 26.0–34.0)
MCHC: 31.9 g/dL (ref 30.0–36.0)
MCV: 86.3 fL (ref 78.0–100.0)
RBC: 2.62 MIL/uL — ABNORMAL LOW (ref 4.22–5.81)

## 2011-08-12 MED ORDER — OXYCODONE HCL 5 MG PO TABS: 10.0000 mg | ORAL_TABLET | ORAL | Status: DC | PRN

## 2011-08-12 MED ORDER — SODIUM CHLORIDE 0.9 % IV SOLN: 1.0000 g | INTRAVENOUS | Status: AC

## 2011-08-12 NOTE — Discharge Summary (Signed)
Justin Sherman MRN: [id number] DOB/AGE: 1941/01/31 71 y.o.  Admit date: 08/04/2011 Discharge date: 08/12/2011  Primary Care Physician:  Kimber Relic, MD, MD   Discharge Diagnoses:   Patient Active Problem List  Diagnoses  . Obstructive chronic bronchitis with exacerbation  . Type II or unspecified type diabetes mellitus without mention of complication, not stated as uncontrolled  . Dementia  . Sepsis  . ARF (acute renal failure)  . Urinary retention  . Fever  . Leukocytosis  . Flaccid hemiplegia affecting dominant side  . Urethral stricture    DISCHARGE MEDICATION: Medication List  As of 08/12/2011 10:23 AM   STOP taking these medications         acetaminophen 325 MG tablet         TAKE these medications         albuterol (5 MG/ML) 0.5% nebulizer solution   Commonly known as: PROVENTIL   Take 2.5 mg by nebulization every 2 (two) hours as needed. For shortness of breath or wheezing.      aspirin 81 MG chewable tablet   Chew 81 mg by mouth every morning.      citalopram 20 MG tablet   Commonly known as: CELEXA   Take 20 mg by mouth at bedtime.      ferrous sulfate 325 (65 FE) MG tablet   Take 325 mg by mouth daily with breakfast.      Fluticasone-Salmeterol 250-50 MCG/DOSE Aepb   Commonly known as: ADVAIR   Inhale 1 puff into the lungs 2 (two) times daily.      guaiFENesin 600 MG 12 hr tablet   Commonly known as: MUCINEX   Take 600 mg by mouth 2 (two) times daily.      insulin aspart 100 UNIT/ML injection   Commonly known as: novoLOG   Inject 5 Units into the skin 4 (four) times daily -  before meals and at bedtime. Give for CBG > 150.      insulin glargine 100 UNIT/ML injection   Commonly known as: LANTUS   Inject 5 Units into the skin at bedtime.      ipratropium-albuterol 0.5-2.5 (3) MG/3ML Soln   Commonly known as: DUONEB   Take 3 mLs by nebulization every 6 (six) hours as needed. For shortness of breath      LORazepam 0.5 MG tablet   Commonly known  as: ATIVAN   Take 0.5 mg by mouth daily as needed. For panic attacks or anxiety.      multivitamins ther. w/minerals Tabs   Take 1 tablet by mouth every morning.      oxyCODONE 5 MG immediate release tablet   Commonly known as: Oxy IR/ROXICODONE   Take 2 tablets (10 mg total) by mouth every 4 (four) hours as needed. For pain      promethazine 25 MG tablet   Commonly known as: PHENERGAN   Take 25 mg by mouth every 6 (six) hours as needed. For nausea      Proteinex Liqd   Take 30 mLs by mouth every morning.      ranitidine 75 MG tablet   Commonly known as: ZANTAC   Take 75 mg by mouth at bedtime.      simvastatin 40 MG tablet   Commonly known as: ZOCOR   Take 40 mg by mouth at bedtime.      sodium chloride 0.9 % SOLN 50 mL with ertapenem 1 G SOLR 1 g   Inject 1 g into the vein  daily.      thiamine 100 MG tablet   Take 100 mg by mouth every morning.              Consults: Treatment Team:  [name], MD   SIGNIFICANT DIAGNOSTIC STUDIES:  Ct Abdomen Pelvis Wo Contrast  08/10/2011  *RADIOLOGY REPORT*  Clinical Data: 71 year old male with abdominal pain and distention. Recent obstructive uropathy.  CT ABDOMEN AND PELVIS WITHOUT CONTRAST  Technique:  Multidetector CT imaging of the abdomen and pelvis was performed following the standard protocol without intravenous contrast.  Comparison: CT abdomen pelvis 08/07/2011.  Findings: Increased layering bilateral pleural effusions, moderate on the left.  Associated compressive atelectasis.  No pericardial effusion.  Stable visualized osseous structures.  Continued stool ball in the rectum.  Presacral stranding / fluid re- identified.  No definite rectal wall thickening.  Sigmoid colon and left colon decompressed.  Transverse colon is decompressed.  Right colon is redundant with retained stool.  Cecum in the midline.  Negative appendix.  No dilated small bowel.  Oral contrast 93 the terminal ileum.  Stomach and duodenum within  normal limits.  Stable noncontrast liver, gallbladder, spleen, pancreas and adrenal glands.  Interval placement of bilateral double-J ureteral stents. On the left, one of the triangular lower pole calculi has moved closer to the renal pelvis.  The recently seen an oval obstructing calculus at the left UPJ is no longer identified.  There may be a small calculus adjacent to the stent in the proximal left ureter on series 2 image 43.  The left renal collecting system has slightly decompressed.  Left perinephric stranding has mildly diminished. The distal left ureter remains decompressed.  The distal left stent is appropriately placed. On the right, no definite residual intrarenal calculus is identified.  No definite calculus along the course of the right stent, motion artifact is more severe on the right and degrades visualization.  The right perinephric stranding and renal collecting systems size are stable.  The proximal right ureter has decompressed.  The distal right pigtail is appropriately placed.  There is a Foley catheter in the bladder but the bladder remains distended.  Small volume retroperitoneal free fluid primarily at the pelvic inlet.  IMPRESSION: 1.  Interval placement of bilateral double J ureteral stents with no adverse features.  Right intrarenal and ureteral calculi no longer identified.  Previously obstructing left ureteral calculus no longer identified.  One of the residual left intrarenal calculi has moved closer to the left renal pelvis.  2. Foley catheter in the bladder abut the bladder is distended. Clinical correlation recommended. 3.  Increased pleural effusions, now moderate on the left. 4.  Continued stool ball in the rectum suspicious for fecal impaction.  Presacral stranding may indicate mild proctitis.  Original Report Authenticated By: Harley Hallmark, M.D.   Ct Abdomen Pelvis Wo Contrast  08/07/2011  *RADIOLOGY REPORT*  Clinical Data: Possible left ureteral stone with left  hydronephrosis and acute renal failure  CT ABDOMEN AND PELVIS WITHOUT CONTRAST  Technique:  Multidetector CT imaging of the abdomen and pelvis was performed following the standard protocol without intravenous contrast.  Comparison: 09/19/2010  Findings: Lung bases shows small left pleural effusion with left lower lobe posterior atelectasis or infiltrate.  Trace right pleural effusion with right basilar atelectasis.  Sagittal images of the spine shows osteopenia and degenerative changes thoracolumbar spine.  Unenhanced liver is unremarkable.  Unenhanced spleen, pancreas and adrenal glands are unremarkable.  There is a nonobstructive calcified calculus mid  pole of the right kidney measures 11 x 8.7 mm.  There is mild right perinephric stranding.  In axial image 47 there is 5 mm and 9 mm calcified calculus in the proximal right ureter at L3-L4 disc space.  There is mild left hydronephrosis.  Multiple calcified calculi are noted in the upper pole of the left kidney the largest measures 9.7 mm.  Calcified calculus in mid pole of the left kidney measures 4.7 mm.  In axial image 43 there is a 10.4 by 8 mm calcified obstructive calculus in proximal left ureter at level of L3 vertebral body. There is  left perinephric stranding.  No calcified gallstones are noted within gallbladder. Atherosclerotic calcifications are noted abdominal aorta and the iliac arteries.  Bilateral distal ureter is unremarkable.  There is a Foley catheter in a decompressed urinary bladder.  Moderate stool noted within rectum.  The rectum is distended measures 7.6 cm in diameter suspicious for fecal impaction.  No small bowel obstruction.  No pericecal inflammation.  No destructive bony lesions are noted within pelvis. 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  In upper pole of the left kidney there is a cyst measures 4.3 x 4 cm.  IMPRESSION:  1.  Bilateral pleural effusions left greater than right with bilateral basilar  posterior atelectasis. 2.  Bilateral nephrolithiasis.  Largest nonobstructive calcified calculus in mid pole of the left kidney measures 11 x 8.7 mm. Largest calculus in upper pole of the left kidney measures 9.7 mm. 3.  There is a calcified calculus in proximal right ureter measures 5 x  9 mm. Mild left perinephric stranding. 4.  There is a obstructive calcified calculus in proximal left ureter measures 10.4 x 8 mm.  Mild left hydronephrosis and mild left perinephric stranding. 5.  Atherosclerotic calcifications of the abdominal aorta and the iliac arteries.  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 6. Significant stool noted within rectum which is distended measures at least 7.6 cm in diameter suspicious for fecal impaction. 7.  There is a Foley catheter in a decompressed urinary bladder.  Original Report Authenticated By: Natasha Mead, M.D.   Ct Head Wo Contrast  08/04/2011  *RADIOLOGY REPORT*  Clinical Data: Dementia.  Stroke.  Mental status changes.  CT HEAD WITHOUT CONTRAST  Technique:  Contiguous axial images were obtained from the base of the skull through the vertex without contrast.  Comparison: 07/11/2010  Findings: The brain shows generalized atrophy.  There is a old infarction in the left middle cerebral artery territory which has progressed atrophy encephalomalacia, similar to the previous study. No sign of acute infarction, mass lesion, hemorrhage, hydrocephalus or extra-axial collection.  The calvarium is unremarkable.  There are mucosal inflammatory changes of the maxillary and ethmoid sinuses.  IMPRESSION: No acute intracranial finding.  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  Some mucosal inflammation of the paranasal sinuses.  Original Report Authenticated By: Thomasenia Sales, M.D.   US Renal Port  08/06/2011  *RADIOLOGY REPORT*  Clinical Data: Hydronephrosis, renal failure.  RENAL/URINARY TRACT ULTRASOUND COMPLETE  Comparison:  CT 09/19/2010  Findings:  Right  Kidney:  10.6 cm.  Lower pole not well visualized due to overlying bowel gas.  No hydronephrosis.  Left Kidney:  13.6 cm.  There is mild left hydronephrosis. Elongated echogenic area noted within the proximal left ureter just below the ureteropelvic junction, presumably obstructing stone. 4.6 cm exophytic cyst off the upper pole.  Bladder:  Foley catheter in place.  The bladder is decompressed.  IMPRESSION: Mild left hydronephrosis.  There appears to be an obstructing proximal left ureteral stone.  Original Report Authenticated By: Cyndie Chime, M.D.   Dg Chest Portable 1 View  08/04/2011  *RADIOLOGY REPORT*  Clinical Data: Shortness of breath.  Diabetes.  Chronic bronchitis.  PORTABLE CHEST - 1 VIEW  Comparison: 09/25/2010.  Findings: Normal sized heart.  Clear lungs.  Mild osteopenia and bilateral shoulder degenerative changes.  Thoracic spine degenerative changes and mild scoliosis.  IMPRESSION: No acute abnormality.  Original Report Authenticated By: Darrol Angel, M.D.   Dg Abd 2 Views  08/04/2011  *RADIOLOGY REPORT*  Clinical Data: Abdominal pain.  ABDOMEN - 2 VIEW  Comparison: Portable chest obtained earlier today.  Findings: Mildly dilated small bowel loops in the right mid to upper abdomen.  Mildly prominent colon filled with gas and stool. Prominent stool in the rectum.  No free peritoneal air.  Lumbar and lower thoracic spine degenerative changes.  Faintly visualized left renal calculi, better seen on the CT dated 09/08/2010.  Previously demonstrated right renal calculi are obscured by the bowel.  Right inferior pelvic phlebolith.  IMPRESSION:  1.  Mild small bowel ileus or partial obstruction. 2.  Mildly prominent stool in the rectum. 3.  Left renal calculi.  Original Report Authenticated By: Darrol Angel, M.D.   Dg Abd Acute W/chest  08/10/2011  *RADIOLOGY REPORT*  Clinical Data: 71 year old male with abdominal pain and distention.  ACUTE ABDOMEN SERIES (ABDOMEN 2 VIEW & CHEST 1 VIEW)   Comparison: CT abdomen pelvis 08/07/2011 and earlier.  Findings: Semi upright AP view the chest.  Left side PICC line in place, tip at the level of the cavoatrial junction.  Lower lung volumes.  Increased retrocardiac opacity.  No pneumothorax.  No pneumoperitoneum.  Crowding of chronic interstitial opacity.  No overt pulmonary edema.  Bilateral double J ureteral stent now in place.  No pneumoperitoneum.  Ureteral stents appear appropriately positioned. Mildly distended large bowel with gas.  Gas continues to the level of the distal descending colon. No acute osseous abnormality identified.  IMPRESSION: 1. Nonobstructed bowel gas pattern, no free air. 2.  Lower lung volumes with retrocardiac atelectasis or consolidation. 3.  Bilateral ureteral stent now in place, appear properly position. 4.  Left PICC line in place, tip at the cavoatrial junction level.  Original Report Authenticated By: Harley Hallmark, M.D.        OTHER PROCEDURES:Cystoscopy, bilateral retrograde pyelogram with interpretation, bilateral JJ stents Cystourethroscopy was accomplished and showed an uncircumcised penis, with with distal urethral stricture, which had been dilated. The cystoscope was placed in the bladder, and the bladder appeared to be hemorrhagic  There was no evidence of bladder tumor. He had granular material within the bladder, however. The left orifice was identified, cannulated, electrically PolyGram was performed which showed hydronephrosis. I did not see the upper ureteral calculus well, which was seen on CT scan. Guidewire was placed into the renal pelvis, and a 6 French by 26 cm double-J stent was placed in the renal pelvis. A large amount of pus was collected in the bladder after placement of the double-J stent. Right retrograde PolyGram was performed, which was difficult. Therefore the short ureteroscope was placed in the right ureteral orifice, and the true ureter identified. A guidewire was passed in the renal pelvis  retrograde PolyGram was performed. Retropyelogram showed no definite stone in the right upper ureter, but the patient a tortuous  ureter. Guidewire was placed in the renal pelvis, and again, a 6 [place] by 26 cm catheter was placed. Fluoroscopic control showed that the double-J stent were in excellent position. The patient had a B. and O. suppository. He was awakened and taken to recovery room in good condition. I have is that the patient's daughter-in-law (guardian), and note that he is intubated, and will try for extubation in recovery room   Recent Results (from the past 240 hour(s))  CULTURE, BLOOD (ROUTINE X 2)     Status: Normal   Collection Time   08/04/11  3:00 PM      Component Value Range Status Comment   Specimen Description BLOOD LEFT WRIST  5 ML IN [facility] BOTTLE   Final    Special Requests Normal   Final    Culture  Setup Time [id number]   Final    Culture     Final    Value: KLEBSIELLA PNEUMONIAE     Note: SUSCEPTIBILITIES PERFORMED ON PREVIOUS CULTURE WITHIN THE LAST 5 DAYS.     Note: Gram Stain Report Called to,Read Back By and Verified With: RN A. ARNOLD ON 08/05/11 AT 1630 BY TEDAR   Report Status 08/07/2011 FINAL   Final   CULTURE, BLOOD (ROUTINE X 2)     Status: Normal   Collection Time   08/04/11  3:55 PM      Component Value Range Status Comment   Specimen Description BLOOD RIGHT HAND  5 ML IN [facility] BOTTLE   Final    Special Requests Normal   Final    Culture  Setup Time [id number]   Final    Culture     Final    Value: KLEBSIELLA PNEUMONIAE     Note: Confirmed Extended Spectrum Beta-Lactamase Producer (ESBL)     Note: Gram Stain Report Called to,Read Back By and Verified With: RN A. ARNOLD ON 08/05/11 AT 1630 BY TEDAR   Report Status 08/07/2011 FINAL   Final    Organism ID, Bacteria KLEBSIELLA PNEUMONIAE   Final   URINE CULTURE     Status: Normal   Collection Time   08/04/11  8:24 PM      Component Value Range Status Comment   Specimen Description URINE, CATHETERIZED    Final    Special Requests Normal   Final    Culture  Setup Time [id number]   Final    Colony Count 75,000 COLONIES/ML   Final    Culture     Final    Value: Multiple bacterial morphotypes present, none predominant. Suggest appropriate recollection if clinically indicated.   Report Status 08/05/2011 FINAL   Final   MRSA PCR SCREENING     Status: Normal   Collection Time   08/04/11  9:48 PM      Component Value Range Status Comment   MRSA by PCR NEGATIVE  NEGATIVE  Final     BRIEF ADMITTING H & P: HPI:  Shamond Skelton is an 71 y.o. male with multiple medical problems including COPD on continuous O2 from [facility] who was sent to ED with fever and SOB. In the ED he was found to have a temperature of 102. He was found to have urinary retention in the ED, and Urology had to be called to place a foley catheter due to increased difficulty passing the foley. 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     Hospital Course:  Present on Admission:  .Sepsis: Patient was admitted with overwhelming sepsis secondary to bacteremia. He was started on broad-spectrum antibiotics with vancomycin imipenem. Blood cultures subsequently showed ESBL Klebsiella in the blood and vancomycin was discontinued. The patient has completed a total of 5/14 days of antibiotics and is to complete an additional 9 for completion of course of therapy. The patient has a PICC line in place and is being discharged on Invanz 1 g IV every 24 hours for a total of 9 additional days.  .Urinary retention : Patient was found to have urinary retention in the emergency room. He underwent a cystoscopy which showed urinary stricture and a double-J stent was placed by urology. During his hospitalization the patient also had an episode with the tip of the catheter appeared to be occluded the patient had another episode of  urinary retention. Once the catheter was flushed there was good urine flow without any further sequela. In discussion with the urologist it was noted the patient will run the risk of having the catheter occluded and should have it flushed to ensure good urinary drainage before any other interventions. The patient is being discharged to skilled nursing facility with his catheter in place and is to leave the catheter in place until he sees Dr. Patsi Sears in the office for further evaluation. An appointment should be made to see Dr. Patsi Sears approximately one week. This information has been communicated to the patient's guardian Ms. Hassell Done.   .ARF (acute renal failure): The patient had acute renal failure with a peak serum creatinine of 3.09. It is felt that this was secondary to the hydronephrosis associated with urinary retention as well as possibly drug effect. Once the double-J stent were placed in the vancomycin was discontinued the patient had a steady decline in his creatinine at the time of discharge the patient's creatinine is 1.9 approaching his baseline of 1.04   .Leukocytosis: Patient had leukocytosis which was felt to be associated with his bacteremia. At the time of discharge the patient's white blood cell count is down to 12.6.   Marland KitchenType II or unspecified type diabetes mellitus without mention of complication, not stated as uncontrolled: During hospitalization the patient was treated with sliding scale and 5 mg of Lantus. His blood sugars are relatively well controlled and he is being continued on his prehospital regimen   Please note discharge plans and clinical condition at discharge had been discussed and communicated to the patient's guardian Ms. Hassell Done.  Disposition and Follow-up:   The patient is being discharged back to skilled nursing facility. He should followup with his primary care physician Dr. Chilton Si within 3 days. He is also to followup with Dr. Patsi Sears in the  urology office in one week. Please note Foley catheter is to be left in place until the patient was seen by Dr. Patsi Sears and further instructions given Discharge Orders    Future Orders Please Complete By Expires   Diet Carb Modified      Activity as tolerated - No restrictions      Discharge instructions      Comments:   Leave foley catheter in place until seen in the office by Dr. Patsi Sears      DISCHARGE EXAM:  General: The patient is a well-appearing. He does have some mild expressive aphasia but is able to adequately communicate his answers and needs to hospital personnel. He is alert and oriented x3, in no acute distress.  Vital Signs:Blood pressure 129/70, pulse 82, temperature 98.7 F (37.1 C), temperature source Oral, resp. rate 16, height 5\' 10"  (1.778 m), weight 84.1 kg (185 lb 6.5 oz), SpO2 94.00%. HEENT: [place]/AT PEERL, EOMI  Neck: Trachea midline, no masses, no thyromegal,y no JVD, no carotid bruit  OROPHARYNX: Moist, No exudate/ erythema/lesions.  Heart: Regular rate and rhythm, without murmurs, rubs, gallops, PMI non-displaced, no heaves or thrills on palpation.  Lungs: Clear to auscultation, no wheezing or rhonchi noted. No increased vocal fremitus resonant to percussion  Abdomen: Non distended, soft, positive bowel sounds, no masses no hepatosplenomegaly noted..  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 Otherwise no focal neurological deficits noted cranial nerves II through XII grossly intact.  Musculoskeletal: No warm swelling or erythema around joints, no spinal tenderness noted   Basename 08/12/11 0543 08/10/11 2025  NA 136 137  K 3.5 4.1  CL 101 104  CO2 26 23  GLUCOSE 88 108*  BUN 18 24*  CREATININE 1.93* 2.01*  CALCIUM 9.1 8.9  MG -- --  PHOS -- --    Basename 08/10/11 2025  AST 25  ALT 21  ALKPHOS 64  BILITOT 0.2*  PROT 6.3  ALBUMIN 2.3*   No results found for this basename: LIPASE:2,AMYLASE:2 in the last 72  hours  Basename 08/12/11 0543 08/10/11 2025  WBC 12.6* 15.7*  NEUTROABS 9.6* 12.9*  HGB 7.2* 7.5*  HCT 22.6* 23.7*  MCV 86.3 86.2  PLT 305 285   Total time for discharge process including face-to-face time approximately 42 minutes  Signed: MATTHEWS,MICHELLE A. 08/12/2011, 10:23 AM

## 2011-08-12 NOTE — Progress Notes (Signed)
[facility]. Foley and PICC in place per md order. Assessment unchanged from previous.

## 2011-08-12 NOTE — Progress Notes (Signed)
Clinical Social Work:  Weekend Coverage  Received call that patient is medically stable for dc today.  Called patient facility: [facility] [place].  Aware of dc and agreeable to accept patient today.  Completed all dc paperwork and faxed to facility.  Patient will require an EMS transport in which CSW arranged and followed up with RN.  No other needs at this time.   Patient aware of dc and will call son with regards to dc back to SNF.  Will sign off.  DC to SNF: [facility]  Ashley Jacobs, MSW LCSW [phone]

## 2011-09-01 ENCOUNTER — Emergency Department (HOSPITAL_COMMUNITY): Payer: Medicare Other

## 2011-09-01 ENCOUNTER — Emergency Department (HOSPITAL_COMMUNITY)
Admission: EM | Admit: 2011-09-01 | Discharge: 2011-09-01 | Disposition: A | Discharge status: Skilled Nursing Facility | Payer: Medicare Other | Attending: Emergency Medicine | Admitting: Emergency Medicine

## 2011-09-01 ENCOUNTER — Encounter (HOSPITAL_COMMUNITY): Payer: Self-pay | Admitting: *Deleted

## 2011-09-01 DIAGNOSIS — F411 Generalized anxiety disorder: Secondary | ICD-10-CM | POA: Insufficient documentation

## 2011-09-01 DIAGNOSIS — G8100 Flaccid hemiplegia affecting unspecified side: Secondary | ICD-10-CM | POA: Insufficient documentation

## 2011-09-01 DIAGNOSIS — F3289 Other specified depressive episodes: Secondary | ICD-10-CM | POA: Insufficient documentation

## 2011-09-01 DIAGNOSIS — I69998 Other sequelae following unspecified cerebrovascular disease: Secondary | ICD-10-CM | POA: Insufficient documentation

## 2011-09-01 DIAGNOSIS — F329 Major depressive disorder, single episode, unspecified: Secondary | ICD-10-CM | POA: Insufficient documentation

## 2011-09-01 DIAGNOSIS — K5900 Constipation, unspecified: Secondary | ICD-10-CM | POA: Insufficient documentation

## 2011-09-01 DIAGNOSIS — E785 Hyperlipidemia, unspecified: Secondary | ICD-10-CM | POA: Insufficient documentation

## 2011-09-01 DIAGNOSIS — D72829 Elevated white blood cell count, unspecified: Secondary | ICD-10-CM

## 2011-09-01 DIAGNOSIS — R209 Unspecified disturbances of skin sensation: Secondary | ICD-10-CM | POA: Insufficient documentation

## 2011-09-01 DIAGNOSIS — I10 Essential (primary) hypertension: Secondary | ICD-10-CM | POA: Insufficient documentation

## 2011-09-01 DIAGNOSIS — Z794 Long term (current) use of insulin: Secondary | ICD-10-CM | POA: Insufficient documentation

## 2011-09-01 DIAGNOSIS — J449 Chronic obstructive pulmonary disease, unspecified: Secondary | ICD-10-CM | POA: Insufficient documentation

## 2011-09-01 DIAGNOSIS — J4489 Other specified chronic obstructive pulmonary disease: Secondary | ICD-10-CM | POA: Insufficient documentation

## 2011-09-01 DIAGNOSIS — Z79899 Other long term (current) drug therapy: Secondary | ICD-10-CM | POA: Insufficient documentation

## 2011-09-01 DIAGNOSIS — K219 Gastro-esophageal reflux disease without esophagitis: Secondary | ICD-10-CM | POA: Insufficient documentation

## 2011-09-01 DIAGNOSIS — N390 Urinary tract infection, site not specified: Secondary | ICD-10-CM

## 2011-09-01 DIAGNOSIS — F0390 Unspecified dementia without behavioral disturbance: Secondary | ICD-10-CM | POA: Insufficient documentation

## 2011-09-01 DIAGNOSIS — R509 Fever, unspecified: Secondary | ICD-10-CM

## 2011-09-01 DIAGNOSIS — E119 Type 2 diabetes mellitus without complications: Secondary | ICD-10-CM | POA: Insufficient documentation

## 2011-09-01 LAB — URINALYSIS, ROUTINE W REFLEX MICROSCOPIC
Bilirubin Urine: NEGATIVE
Glucose, UA: NEGATIVE mg/dL
Specific Gravity, Urine: 1.015 (ref 1.005–1.030)
Urobilinogen, UA: 0.2 mg/dL (ref 0.0–1.0)
pH: 5.5 (ref 5.0–8.0)

## 2011-09-01 LAB — BASIC METABOLIC PANEL
BUN: 27 mg/dL — ABNORMAL HIGH (ref 6–23)
CO2: 26 mEq/L (ref 19–32)
GFR calc non Af Amer: 26 mL/min — ABNORMAL LOW (ref >90)
Glucose, Bld: 136 mg/dL — ABNORMAL HIGH (ref 70–99)
Potassium: 4.1 mEq/L (ref 3.5–5.1)
Sodium: 132 mEq/L — ABNORMAL LOW (ref 135–145)

## 2011-09-01 LAB — URINE MICROSCOPIC-ADD ON

## 2011-09-01 LAB — DIFFERENTIAL
Eosinophils Absolute: 0.1 10*3/uL (ref 0.0–0.7)
Eosinophils Relative: 1 % (ref 0–5)
Lymphocytes Relative: 8 % — ABNORMAL LOW (ref 12–46)
Lymphs Abs: 1.7 10*3/uL (ref 0.7–4.0)
Monocytes Relative: 12 % (ref 3–12)
Neutrophils Relative %: 79 % — ABNORMAL HIGH (ref 43–77)

## 2011-09-01 LAB — CBC
Hemoglobin: 8.1 g/dL — ABNORMAL LOW (ref 13.0–17.0)
MCH: 27.2 pg (ref 26.0–34.0)
MCV: 86.6 fL (ref 78.0–100.0)
RBC: 2.98 MIL/uL — ABNORMAL LOW (ref 4.22–5.81)
WBC: 20.9 10*3/uL — ABNORMAL HIGH (ref 4.0–10.5)

## 2011-09-01 MED ORDER — CIPROFLOXACIN HCL 500 MG PO TABS: 500.0000 mg | ORAL_TABLET | Freq: Two times a day (BID) | ORAL | Status: AC

## 2011-09-01 MED ORDER — DEXTROSE 5 % IV SOLN
1.0000 g | Freq: Once | INTRAVENOUS | Status: AC
  Administered 2011-09-01: 1 g via INTRAVENOUS
  Filled 2011-09-01: qty 10

## 2011-09-01 MED ORDER — IOHEXOL 300 MG/ML  SOLN
20.0000 mL | INTRAMUSCULAR | Status: AC
  Administered 2011-09-01: 20 mL via ORAL

## 2011-09-01 MED ORDER — ACETAMINOPHEN 325 MG PO TABS
650.0000 mg | ORAL_TABLET | Freq: Once | ORAL | Status: AC
  Administered 2011-09-01: 650 mg via ORAL
  Filled 2011-09-01: qty 2

## 2011-09-01 NOTE — ED Notes (Signed)
Spoke w/ Alecia Lemming, RN - caregiver of pt at [facility] [phone]) - states staff can administer IV antibiotics to pt at that facility through a PIV. Updated caregiver on plan of care for pt and possible d/c back to facility on IV antibiotics for UTI - Caregiver verbalized understanding and denies any further questions or concerns at present.

## 2011-09-01 NOTE — ED Notes (Signed)
Patient transported to X-ray 

## 2011-09-01 NOTE — ED Notes (Signed)
Per EMS pt from [facility] - pt recently d/c'd from [facility] s/p sepsis - pt "hasn't been feeling well" labs drawn at facility and noted to have increased creatinine. Pt on continuous oxygen 2L.

## 2011-09-01 NOTE — ED Notes (Signed)
Pt poor historian d/t hx of dementia - pt only answers "yea" when asked questions. Pt in no acute distress on assessment - after reviewing pt's paperwork from nursing facility - pt is being sent for eval d/t increase in WBC, from 10.6 on 07/18/2011 to 20.6 on 08/31/2011, as well as increase in creatinine of 1.58 on 05/23/2011 to 2.12 on 08/31/2011.

## 2011-09-01 NOTE — Discharge Instructions (Signed)
You have a urinary tract infection.  We began your treatment in the emergency department.  Use Cipro 500 mg daily for 10 days for infection.  Use Tylenol or Motrin for fever.  Followup with your Dr. for reevaluation.  Return for worse symptoms

## 2011-09-01 NOTE — ED Provider Notes (Signed)
History     CSN: [id number]  Arrival date & time 09/01/11  0031   First MD Initiated Contact with Patient 09/01/11 0300      Chief Complaint  Patient presents with  . Fever    (Consider location/radiation/quality/duration/timing/severity/associated sxs/prior treatment) Patient is a 71 y.o. male presenting with fever. The history is provided by medical records and the nursing home. The history is limited by the condition of the patient.  Fever Primary symptoms of the febrile illness include fever.  pt has dementia and hx of stroke.  His speech is not intelligible. [facility] for eval of fever. Level 5 caveat for severe dementia.  Past Medical History  Diagnosis Date  . CVA, old, alterations of sensations   . Acute conjunctivitis, unspecified   . Aortic aneurysm of unspecified site without mention of rupture   . Flaccid hemiplegia affecting dominant side   . Type II or unspecified type diabetes mellitus without mention of complication, not stated as uncontrolled   . Depressive disorder, not elsewhere classified   . Unspecified essential hypertension   . Obstructive chronic bronchitis with exacerbation   . Anxiety state, unspecified   . Esophageal reflux   . Other and unspecified hyperlipidemia   . Pressure ulcer, other site   . Dementia     Past Surgical History  Procedure Date  . Cystoscopy w/ ureteral stent placement 08/07/2011    Procedure: CYSTOSCOPY WITH RETROGRADE PYELOGRAM/URETERAL STENT PLACEMENT;  Surgeon: Kathi Ludwig, MD;  Location: [facility];  Service: Urology;  Laterality: Bilateral;  bilateral stent placement ureterosopy    History reviewed. No pertinent family history.  History  Substance Use Topics  . Smoking status: Never Smoker   . Smokeless tobacco: Not on file  . Alcohol Use: No      Review of Systems  Unable to perform ROS Constitutional: Positive for fever.    Allergies  Review of patient's allergies indicates no known  allergies.  Home Medications   Current Outpatient Rx  Name Route Sig Dispense Refill  . ALBUTEROL SULFATE (5 MG/ML) 0.5% IN NEBU Nebulization Take 2.5 mg by nebulization every 2 (two) hours as needed. For shortness of breath or wheezing.    Marland Kitchen PROTEINEX PO LIQD Oral Take 30 mLs by mouth every morning.    . ASPIRIN 81 MG PO CHEW Oral Chew 81 mg by mouth every morning.    Marland Kitchen CITALOPRAM HYDROBROMIDE 20 MG PO TABS Oral Take 20 mg by mouth at bedtime.      Marland Kitchen FERROUS SULFATE 325 (65 FE) MG PO TABS Oral Take 325 mg by mouth daily with breakfast.      . FLUTICASONE-SALMETEROL 250-50 MCG/DOSE IN AEPB Inhalation Inhale 1 puff into the lungs 2 (two) times daily.     . GUAIFENESIN ER 600 MG PO TB12 Oral Take 600 mg by mouth 2 (two) times daily.      . INSULIN ASPART 100 UNIT/ML [place] SOLN Subcutaneous Inject 5 Units into the skin 4 (four) times daily -  before meals and at bedtime. Give for CBG > 150.    Marland Kitchen INSULIN GLARGINE 100 UNIT/ML [place] SOLN Subcutaneous Inject 5 Units into the skin at bedtime.      . IPRATROPIUM-ALBUTEROL 0.5-2.5 (3) MG/3ML IN SOLN Nebulization Take 3 mLs by nebulization every 6 (six) hours as needed. For shortness of breath     . LORAZEPAM 0.5 MG PO TABS Oral Take 0.5 mg by mouth daily as needed. For panic attacks  or anxiety.    Carma Leaven M PLUS PO TABS Oral Take 1 tablet by mouth every morning.     . OXYCODONE HCL 5 MG PO TABS Oral Take 2 tablets (10 mg total) by mouth every 4 (four) hours as needed. For pain 30 tablet 0  . PROMETHAZINE HCL 25 MG PO TABS Oral Take 25 mg by mouth every 6 (six) hours as needed. For nausea     . RANITIDINE HCL 75 MG PO TABS Oral Take 75 mg by mouth at bedtime.    Marland Kitchen SIMVASTATIN 40 MG PO TABS Oral Take 40 mg by mouth at bedtime.      . THIAMINE HCL 100 MG PO TABS Oral Take 100 mg by mouth every morning.       BP 119/68  Pulse 94  Temp(Src) 101.7 F (38.7 C) (Oral)  Resp 18  SpO2 99%  Physical Exam  Constitutional: He appears well-developed and  well-nourished. No distress.  HENT:  Head: Normocephalic and atraumatic.  Eyes: Conjunctivae are normal.  Neck: Normal range of motion. Neck supple.  Cardiovascular: Normal rate and regular rhythm.   Pulmonary/Chest: Effort normal. He has no rales.  Abdominal: Soft. Bowel sounds are normal. There is tenderness. There is no guarding.       Diffuse mild tenderness.  No guard  Neurological: He is alert.  Skin: Skin is warm and dry.  Psychiatric:       Unable to assess because of profound dementia    ED Course  Procedures (including critical care time) Fever of unknown source.  In profoundly demented male, will perform chest x-ray, laboratory testing, and CAT scan for further evaluation  Labs Reviewed  CBC - Abnormal; Notable for the following:    WBC 20.9 (*)    RBC 2.98 (*)    Hemoglobin 8.1 (*)    HCT 25.8 (*)    All other components within normal limits  DIFFERENTIAL - Abnormal; Notable for the following:    Neutrophils Relative 79 (*)    Neutro Abs 16.5 (*)    Lymphocytes Relative 8 (*)    Monocytes Absolute 2.6 (*)    All other components within normal limits  BASIC METABOLIC PANEL - Abnormal; Notable for the following:    Sodium 132 (*)    Glucose, Bld 136 (*)    BUN 27 (*)    Creatinine, Ser 2.31 (*)    GFR calc non Af Amer 26 (*)    GFR calc Af Amer 31 (*)    All other components within normal limits  URINALYSIS, ROUTINE W REFLEX MICROSCOPIC   Dg Chest 2 View  09/01/2011  *RADIOLOGY REPORT*  Clinical Data: Cold like symptoms.  Possible pneumonia.  CHEST - 2 VIEW  Comparison: 08/04/2011  Findings: Shallow inspiration.  Normal heart size and pulmonary vascularity.  No focal airspace consolidation in the lungs.  No blunting of costophrenic angles.  Degenerative changes in the thoracic spine.  Apparent ureteral stent present.  No significant change since previous study.  IMPRESSION: No evidence of active pulmonary disease.  Original Report Authenticated By: Marlon Pel, M.D.     No diagnosis found.    MDM  Fever UTI leukocytosis        Cheri Guppy, MD 09/01/11 [phone]

## 2011-09-01 NOTE — ED Notes (Signed)
Attempted to In and out cath pt  for a urinalysis, met a lot of resistance. Nurse notified.

## 2011-09-01 NOTE — ED Provider Notes (Addendum)
Pt seen by Dr Weldon Inches.  CT scan reviewed by me.  No sign of acute abnormality other than the constipation noted.  Pt discharged back to nursing facility with abx as per Dr Cathlean Sauer plan  Celene Kras, MD 09/01/11 1015  Celene Kras, MD 09/01/11 [phone]

## 2011-09-01 NOTE — ED Notes (Signed)
Patient transported to CT 

## 2011-09-10 ENCOUNTER — Other Ambulatory Visit: Payer: Self-pay | Admitting: Urology

## 2011-11-29 ENCOUNTER — Encounter (HOSPITAL_COMMUNITY): Payer: Self-pay | Admitting: Pharmacy Technician

## 2011-11-29 ENCOUNTER — Encounter (HOSPITAL_COMMUNITY): Payer: Self-pay | Admitting: *Deleted

## 2011-12-07 ENCOUNTER — Encounter (HOSPITAL_COMMUNITY): Payer: Self-pay

## 2011-12-07 ENCOUNTER — Ambulatory Visit (HOSPITAL_COMMUNITY): Payer: Medicare Other | Admitting: Anesthesiology

## 2011-12-07 ENCOUNTER — Encounter (HOSPITAL_COMMUNITY): Payer: Self-pay | Admitting: Anesthesiology

## 2011-12-07 ENCOUNTER — Ambulatory Visit (HOSPITAL_COMMUNITY)
Admission: RE | Admit: 2011-12-07 | Discharge: 2011-12-07 | Disposition: A | Discharge status: Skilled Nursing Facility | Payer: Medicare Other | Source: Ambulatory Visit | Attending: Urology | Admitting: Urology

## 2011-12-07 ENCOUNTER — Encounter (HOSPITAL_COMMUNITY)
Admission: RE | Disposition: A | Discharge status: Skilled Nursing Facility | Payer: Self-pay | Source: Ambulatory Visit | Attending: Urology

## 2011-12-07 DIAGNOSIS — Z8673 Personal history of transient ischemic attack (TIA), and cerebral infarction without residual deficits: Secondary | ICD-10-CM | POA: Insufficient documentation

## 2011-12-07 DIAGNOSIS — N201 Calculus of ureter: Secondary | ICD-10-CM | POA: Insufficient documentation

## 2011-12-07 DIAGNOSIS — Z87442 Personal history of urinary calculi: Secondary | ICD-10-CM

## 2011-12-07 DIAGNOSIS — E7800 Pure hypercholesterolemia, unspecified: Secondary | ICD-10-CM | POA: Insufficient documentation

## 2011-12-07 DIAGNOSIS — I10 Essential (primary) hypertension: Secondary | ICD-10-CM | POA: Insufficient documentation

## 2011-12-07 DIAGNOSIS — R339 Retention of urine, unspecified: Secondary | ICD-10-CM

## 2011-12-07 DIAGNOSIS — E119 Type 2 diabetes mellitus without complications: Secondary | ICD-10-CM | POA: Insufficient documentation

## 2011-12-07 DIAGNOSIS — Z79899 Other long term (current) drug therapy: Secondary | ICD-10-CM | POA: Insufficient documentation

## 2011-12-07 HISTORY — DX: Chronic kidney disease, unspecified: N18.9

## 2011-12-07 HISTORY — PX: CYSTOSCOPY W/ URETERAL STENT PLACEMENT: SHX1429

## 2011-12-07 HISTORY — PX: CYSTOSCOPY/RETROGRADE/URETEROSCOPY: SHX5316

## 2011-12-07 HISTORY — DX: Cerebral infarction, unspecified: I63.9

## 2011-12-07 HISTORY — PX: CYSTOSCOPY WITH URETHRAL DILATATION: SHX5125

## 2011-12-07 LAB — BASIC METABOLIC PANEL
CO2: 32 mEq/L (ref 19–32)
Calcium: 10.1 mg/dL (ref 8.4–10.5)
Creatinine, Ser: 1.91 mg/dL — ABNORMAL HIGH (ref 0.50–1.35)
GFR calc non Af Amer: 33 mL/min — ABNORMAL LOW (ref >90)
Sodium: 138 mEq/L (ref 135–145)

## 2011-12-07 LAB — SURGICAL PCR SCREEN
MRSA, PCR: NEGATIVE
Staphylococcus aureus: POSITIVE — AB

## 2011-12-07 LAB — CBC
HCT: 30.3 % — ABNORMAL LOW (ref 39.0–52.0)
MCHC: 31 g/dL (ref 30.0–36.0)
Platelets: 202 10*3/uL (ref 150–400)
RDW: 14.9 % (ref 11.5–15.5)
WBC: 8.9 10*3/uL (ref 4.0–10.5)

## 2011-12-07 LAB — GLUCOSE, CAPILLARY
Glucose-Capillary: 89 mg/dL (ref 70–99)
Glucose-Capillary: 83 mg/dL (ref 70–99)

## 2011-12-07 SURGERY — CYSTOSCOPY/RETROGRADE/URETEROSCOPY
Anesthesia: General | Site: Urethra | Laterality: Bilateral | Wound class: Clean Contaminated

## 2011-12-07 MED ORDER — HEPARIN SODIUM (PORCINE) 5000 UNIT/ML IJ SOLN
5000.0000 [IU] | Freq: Once | INTRAMUSCULAR | Status: AC
  Administered 2011-12-07: 5000 [IU] via SUBCUTANEOUS
  Filled 2011-12-07: qty 1

## 2011-12-07 MED ORDER — LIDOCAINE HCL 2 % EX GEL
CUTANEOUS | Status: AC
Start: 2011-12-07 — End: 2011-12-07
  Filled 2011-12-07: qty 10

## 2011-12-07 MED ORDER — FENTANYL CITRATE 0.05 MG/ML IJ SOLN
25.0000 ug | INTRAMUSCULAR | Status: DC | PRN
  Administered 2011-12-07: 25 ug via INTRAVENOUS
  Administered 2011-12-07: 50 ug via INTRAVENOUS

## 2011-12-07 MED ORDER — FENTANYL CITRATE 0.05 MG/ML IJ SOLN
INTRAMUSCULAR | Status: DC | PRN
  Administered 2011-12-07: 100 ug via INTRAVENOUS

## 2011-12-07 MED ORDER — CEFAZOLIN SODIUM-DEXTROSE 2-3 GM-% IV SOLR
INTRAVENOUS | Status: AC
  Filled 2011-12-07: qty 50

## 2011-12-07 MED ORDER — ONDANSETRON HCL 4 MG/2ML IJ SOLN
INTRAMUSCULAR | Status: DC | PRN
  Administered 2011-12-07: 4 mg via INTRAVENOUS

## 2011-12-07 MED ORDER — ACETAMINOPHEN 10 MG/ML IV SOLN
INTRAVENOUS | Status: AC
Start: 2011-12-07 — End: 2011-12-07
  Filled 2011-12-07: qty 100

## 2011-12-07 MED ORDER — INDIGOTINDISULFONATE SODIUM 8 MG/ML IJ SOLN
INTRAMUSCULAR | Status: DC | PRN
  Administered 2011-12-07: 5 mL via INTRAVENOUS

## 2011-12-07 MED ORDER — SODIUM CHLORIDE 0.9 % IR SOLN
Status: DC | PRN
  Administered 2011-12-07: 3000 mL

## 2011-12-07 MED ORDER — IOHEXOL 300 MG/ML  SOLN
INTRAMUSCULAR | Status: AC
  Filled 2011-12-07: qty 1

## 2011-12-07 MED ORDER — CEFAZOLIN SODIUM-DEXTROSE 2-3 GM-% IV SOLR
INTRAVENOUS | Status: DC | PRN
  Administered 2011-12-07: 2 g via INTRAVENOUS

## 2011-12-07 MED ORDER — BELLADONNA ALKALOIDS-OPIUM 16.2-60 MG RE SUPP
RECTAL | Status: AC
  Filled 2011-12-07: qty 1

## 2011-12-07 MED ORDER — LACTATED RINGERS IV SOLN
INTRAVENOUS | Status: DC
  Administered 2011-12-07: 1000 mL via INTRAVENOUS

## 2011-12-07 MED ORDER — LACTATED RINGERS IV SOLN
INTRAVENOUS | Status: DC | PRN
  Administered 2011-12-07: 11:00:00 via INTRAVENOUS

## 2011-12-07 MED ORDER — ESTRADIOL 0.1 MG/GM VA CREA
TOPICAL_CREAM | VAGINAL | Status: AC
  Filled 2011-12-07: qty 42.5

## 2011-12-07 MED ORDER — ALBUTEROL SULFATE (5 MG/ML) 0.5% IN NEBU
2.5000 mg | INHALATION_SOLUTION | Freq: Once | RESPIRATORY_TRACT | Status: AC
  Administered 2011-12-07: 2.5 mg via RESPIRATORY_TRACT

## 2011-12-07 MED ORDER — FENTANYL CITRATE 0.05 MG/ML IJ SOLN
INTRAMUSCULAR | Status: AC
  Filled 2011-12-07: qty 2

## 2011-12-07 MED ORDER — INDIGOTINDISULFONATE SODIUM 8 MG/ML IJ SOLN
INTRAMUSCULAR | Status: AC
Start: 2011-12-07 — End: 2011-12-07
  Filled 2011-12-07: qty 5

## 2011-12-07 MED ORDER — ALBUTEROL SULFATE (5 MG/ML) 0.5% IN NEBU
INHALATION_SOLUTION | RESPIRATORY_TRACT | Status: AC
  Filled 2011-12-07: qty 0.5

## 2011-12-07 MED ORDER — PROPOFOL 10 MG/ML IV BOLUS
INTRAVENOUS | Status: DC | PRN
  Administered 2011-12-07: 150 mg via INTRAVENOUS

## 2011-12-07 MED ORDER — MUPIROCIN 2 % EX OINT
TOPICAL_OINTMENT | Freq: Two times a day (BID) | CUTANEOUS | Status: DC
  Administered 2011-12-07: 10:00:00 via NASAL
  Filled 2011-12-07: qty 22

## 2011-12-07 MED ORDER — EPHEDRINE SULFATE 50 MG/ML IJ SOLN
INTRAMUSCULAR | Status: DC | PRN
  Administered 2011-12-07: 10 mg via INTRAVENOUS

## 2011-12-07 SURGICAL SUPPLY — 16 items
ADAPTER CATH URET PLST 4-6FR (CATHETERS) ×3 IMPLANT
BAG URO CATCHER STRL LF (DRAPE) ×3 IMPLANT
BASKET ZERO TIP NITINOL 2.4FR (BASKET) IMPLANT
CATH INTERMIT  6FR 70CM (CATHETERS) ×3 IMPLANT
CLOTH BEACON ORANGE TIMEOUT ST (SAFETY) ×3 IMPLANT
DRAPE CAMERA CLOSED 9X96 (DRAPES) ×3 IMPLANT
GLOVE BIOGEL M STRL SZ7.5 (GLOVE) ×3 IMPLANT
GOWN STRL NON-REIN LRG LVL3 (GOWN DISPOSABLE) ×3 IMPLANT
GOWN STRL REIN XL XLG (GOWN DISPOSABLE) ×3 IMPLANT
GUIDEWIRE STR DUAL SENSOR (WIRE) ×3 IMPLANT
MANIFOLD NEPTUNE II (INSTRUMENTS) ×3 IMPLANT
NS IRRIG 1000ML POUR BTL (IV SOLUTION) ×3 IMPLANT
PACK CYSTO (CUSTOM PROCEDURE TRAY) ×3 IMPLANT
SCRUB PCMX 4 OZ (MISCELLANEOUS) ×3 IMPLANT
STENT CONTOUR 6FRX24X.038 (STENTS) ×6 IMPLANT
TUBING CONNECTING 10 (TUBING) ×3 IMPLANT

## 2011-12-07 NOTE — Anesthesia Preprocedure Evaluation (Addendum)
Anesthesia Evaluation  Patient identified by MRN, date of birth, ID band Patient awake    Reviewed: Allergy & Precautions, H&P , NPO status , Patient's Chart, lab work & pertinent test results  Airway Mallampati: II TM Distance: >3 FB Neck ROM: full    Dental  (+) Poor Dentition, Missing and Dental Advisory Given Many missing teeth in front:   Pulmonary neg pulmonary ROS, COPD COPD inhaler,  breath sounds clear to auscultation  Pulmonary exam normal       Cardiovascular Exercise Tolerance: Good hypertension, Pt. on medications negative cardio ROS  Rhythm:regular Rate:Normal  Abdominal aortic aneurysm.   Neuro/Psych Dementia. Flaccid hemiplegia on left. CVA, Residual Symptoms negative neurological ROS  negative psych ROS   GI/Hepatic negative GI ROS, Neg liver ROS, GERD-  Medicated and Controlled,  Endo/Other  negative endocrine ROSWell Controlled, Type 2, Insulin Dependent  Renal/GU ARFRenal diseasenegative Renal ROS4/13 ARF superimposed on chronic renal insuff. Cr. 1.91 now.  negative genitourinary   Musculoskeletal   Abdominal   Peds  Hematology negative hematology ROS (+) Blood dyscrasia, anemia , Hgb. 9.4   Anesthesia Other Findings   Reproductive/Obstetrics negative OB ROS                          Anesthesia Physical Anesthesia Plan  ASA: III  Anesthesia Plan: General   Post-op Pain Management:    Induction: Intravenous  Airway Management Planned: LMA  Additional Equipment:   Intra-op Plan:   Post-operative Plan:   Informed Consent: I have reviewed the patients History and Physical, chart, labs and discussed the procedure including the risks, benefits and alternatives for the proposed anesthesia with the patient or authorized representative who has indicated his/her understanding and acceptance.   Dental Advisory Given  Plan Discussed with: CRNA and Surgeon  Anesthesia  Plan Comments:         Anesthesia Quick Evaluation

## 2011-12-07 NOTE — Transfer of Care (Signed)
Immediate Anesthesia Transfer of Care Note  Patient: Justin Sherman  Procedure(s) Performed: Procedure(s) (LRB): CYSTOSCOPY/RETROGRADE/URETEROSCOPY (Bilateral) CYSTOSCOPY WITH STENT REPLACEMENT (Bilateral) CYSTOSCOPY WITH URETHRAL DILATATION ()  Patient Location: PACU  Anesthesia Type: General  Level of Consciousness: awake, alert  and patient cooperative  Airway & Oxygen Therapy: Patient Spontanous Breathing and Patient connected to face mask oxygen  Post-op Assessment: Report given to PACU RN and Post -op Vital signs reviewed and stable  Post vital signs: Reviewed and stable  Complications: No apparent anesthesia complications

## 2011-12-07 NOTE — Progress Notes (Signed)
Dr. Leta Jungling in to check pt

## 2011-12-07 NOTE — H&P (Signed)
ctive Problems Problems  1. Proximal Ureteral Stone Bilaterally 592.1  History of Present Illness     71 yo diabetic, divorced male presents today for f/u after recent hospitalization on 08/07/11 for urosepsis with bilateral ureteral stones with obstruction.  He had cysto/bilateral ureteral JJ stents placed on 08/07/11.  Hx of urethral stricture as well.   Past Medical History Problems  1. History of  Anxiety (Symptom) 300.00 2. History of  Heart Disease 429.9 3. History of  Heartburn 787.1 4. History of  Hypercholesterolemia 272.0 5. History of  Hypertension 401.9 6. History of  Renal Disease 593.9 7. History of  Transient Ischemic Attack 435.9  Surgical History Problems  1. History of  Cystoscopy With Insertion Of Ureteral Stent Bilateral 2. History of  Cystoscopy With Insertion Of Ureteral Stent Bilateral  Current Meds 1. Advair Diskus 250-50 MCG/DOSE Inhalation Aerosol Powder Breath Activated; Therapy:  (Recorded:15May2013) to 2. Aspirin 81 MG Oral Tablet; Therapy: (Recorded:15May2013) to 3. Ativan 0.5 MG Oral Tablet; Therapy: (Recorded:15May2013) to 4. CeleXA 20 MG Oral Tablet; Therapy: (Recorded:15May2013) to 5. Ferrous Sulfate 325 (65 Fe) MG Oral Tablet Delayed Release; Therapy: (Recorded:15May2013) to 6. Guaifenesin-Codeine 100-10 MG/5ML Oral Solution; Therapy: (Recorded:15May2013) to 7. Jodelle Gross; Therapy: (Recorded:15May2013) to 8. Lantus SOLN; Therapy: (Recorded:15May2013) to 9. NovoLOG SOLN; Therapy: (Recorded:15May2013) to 10. OxyCODONE HCl 5 MG Oral Tablet; Therapy: (Recorded:15May2013) to 11. Zocor 40 MG Oral Tablet; Therapy: (Recorded:15May2013) to  Allergies Medication  1. No Known Drug Allergies  Family History Problems  1. Family history of  Family Health Status - Mother's Age 26 2. Family history of  Family Health Status Number Of Children 1 daughter 3. Family history of  Father Deceased At Age ____  Social History Problems  1. Caffeine Use 2.  Marital History - Divorced V61.03 3. Never A Smoker 4. Retired From Work Denied  5. History of  Alcohol Use  Review of Systems Genitourinary, constitutional, skin, eye, otolaryngeal, hematologic/lymphatic, cardiovascular, pulmonary, endocrine, musculoskeletal, gastrointestinal, neurological and psychiatric system(s) were reviewed and pertinent findings if present are noted.  Gastrointestinal: heartburn.  Integumentary: skin rash/lesion.  Respiratory: shortness of breath.  Psychiatric: anxiety and depression.    Vitals Vital Signs [Data Includes: Last 1 Day]  15May2013 03:21PM  BMI Calculated: 29.8 BSA Calculated: 2.22 Height: 6 ft  Weight: 220 lb  Blood Pressure: 123 / 72 Temperature: 98.3 F Heart Rate: 61  Assessment Assessed  1. Proximal Ureteral Stone Bilaterally 592.1   I have reviewed the the hospital xrays with the patient and his brother. Wife is the POA. pt is saying he does not want surgery to remove stones today..Brother will discuss with his wife to decide what should be done. Options would be to leave stents in temporarily, then remove or replace them 4-6 months; another option is to take stents out and leave them out, and other option is to try to operate on stones and remove them. =this will be difficult with flexible ureteroscopy and laser of stones and JJ removal.   Plan  Pt 's brother will discuss with wife ( POA) and will let me know.   Signatures Electronically signed by : Jethro Bolus, M.D.; Aug 22 2011  4:16PM

## 2011-12-07 NOTE — Op Note (Signed)
Pre-operative diagnosis : Bilateral ureteral obstruction with retained double-J stents  Postoperative diagnosis: Same  Operation: Urethral dilation to 42F with the [employer] Sounds; Cystourethroscopy, removal of bilateral 6 x 26 cm double-J stents, bilateral retrograde PolyGram interpretation, reinsertion of 6 x 24 cm bilateral double-J stent  Surgeon:  S. Patsi Sears, MD  First assistant: None  Anesthesia: General LMA  Preparation: After appropriate preanesthesia, the patient was brought to the operating room, placed on the operating table in the dorsal supine position where general LMA anesthesia was introduced. He was then replaced in the dorsal lithotomy position where the pubis was prepped with Betadine solution and draped in usual fashion. The arm band was double checked.  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 He had cysto/bilateral ureteral JJ stents placed on 08/07/11. Hx of urethral stricture as well. He is now for exchange of JJ stents. Sister is POA.    Statement of  Likelihood of Success: Excellent. TIME-OUT observed.:  Procedure: Examination of the penis shows the patient has severe penile phimosis the perineum unable to open the foreskin in order to clean the glans. I am able to place a dilator in the urethra, and note that the 22 cystoscope could not be passed into the bladder, because of a stricture, at the penoscrotal junction. This is dilated from a size 27 [place] to a size 48 [place] with the [employer]. From this, unable to pass the 22 cystoscope into the bladder. The double-J stents are identified. They are size 6 [place] by 26 cm. They are inclined at the level of the trigone. They are blackened in color. There is no stone on the stents. There is no evidence of stone, tumor, or diverticulum the bladder.  There is marked edema around the right ureteral orifice and the left ureteral orifice.  Using the alligator forceps, the double-J stent scar removed. Indigocarmine was given because I am unable to find the ureteral orifices. Using a 6 open-ended catheter, and probing, unable to find the what appears to be the right ureteral orifice. Freshly progress performed, which shows a tiny bit of contrast going up the right ureter. An 038 guidewire is then manipulated into the ureter, and into the right renal pelvis. No definite calculus is seen the ureter. However, no indigo carmine was ever seen during the entire procedure. I elected to proceed placing the double-J stent, but shortening the length of stents the 24th of his. Therefore, 6 [place] by 24 cm catheter was coiled in the renal pelvis and in the bladder on the right side. This was anuric the left side. Again, edema serenity entire ureteral orifice. Retropyelogram was performed through what appeared to be the orifice, and again, only a small amount of ureter could be identified. The guidewire was manipulated into the intramural ureter, and into the right renal pelvis. The 6 x 24 catheter was then inserted into the renal pelvis, and coiled in the bladder. Again, a 6 x 24 cm stent was placed. The bladder is drained of fluid. Again, no indigo carmine was identified during the procedure. The patient was awakened, taken to recovery room in good condition. It is noted that his serum creatinine is less than 2.

## 2011-12-07 NOTE — Interval H&P Note (Signed)
History and Physical Interval Note:  12/07/2011 11:23 AM  Justin Sherman  has presented today for surgery, with the diagnosis of Bilateral Ureteral Obstruction  The various methods of treatment have been discussed with the patient and family. After consideration of risks, benefits and other options for treatment, the patient has consented to  Procedure(s) (LRB): CYSTOSCOPY/RETROGRADE/URETEROSCOPY (Bilateral) CYSTOSCOPY WITH STENT REPLACEMENT (Bilateral) as a surgical intervention .  The patient's history has been reviewed, patient examined, no change in status, stable for surgery.  I have reviewed the patient's chart and labs.  Questions were answered to the patient's satisfaction.     Jethro Bolus I

## 2011-12-07 NOTE — Preoperative (Signed)
Beta Blockers   Reason not to administer Beta Blockers:Not Applicable 

## 2011-12-07 NOTE — Anesthesia Postprocedure Evaluation (Signed)
  Anesthesia Post-op Note  Patient: Justin Sherman  Procedure(s) Performed: Procedure(s) (LRB): CYSTOSCOPY/RETROGRADE/URETEROSCOPY (Bilateral) CYSTOSCOPY WITH STENT REPLACEMENT (Bilateral) CYSTOSCOPY WITH URETHRAL DILATATION ()  Patient Location: PACU  Anesthesia Type: General  Level of Consciousness: awake and alert   Airway and Oxygen Therapy: Patient Spontanous Breathing  Post-op Pain: mild  Post-op Assessment: Post-op Vital signs reviewed, Patient's Cardiovascular Status Stable, Respiratory Function Stable, Patent Airway and No signs of Nausea or vomiting  Post-op Vital Signs: stable  Complications: No apparent anesthesia complications

## 2011-12-07 NOTE — Progress Notes (Signed)
Pt's O2 sats around 90; slight expiratory wheeze; Dr. Leta Jungling notified, order rec'd and vent tx given

## 2011-12-11 ENCOUNTER — Encounter (HOSPITAL_COMMUNITY): Payer: Self-pay | Admitting: Urology

## 2012-01-17 ENCOUNTER — Emergency Department (HOSPITAL_COMMUNITY): Payer: Medicare Other

## 2012-01-17 ENCOUNTER — Encounter (HOSPITAL_COMMUNITY): Payer: Self-pay | Admitting: Emergency Medicine

## 2012-01-17 ENCOUNTER — Emergency Department (HOSPITAL_COMMUNITY)
Admission: EM | Admit: 2012-01-17 | Discharge: 2012-01-17 | Disposition: A | Discharge status: Skilled Nursing Facility | Payer: Medicare Other | Attending: Emergency Medicine | Admitting: Emergency Medicine

## 2012-01-17 DIAGNOSIS — F0390 Unspecified dementia without behavioral disturbance: Secondary | ICD-10-CM | POA: Insufficient documentation

## 2012-01-17 DIAGNOSIS — Z79899 Other long term (current) drug therapy: Secondary | ICD-10-CM | POA: Insufficient documentation

## 2012-01-17 DIAGNOSIS — S0181XA Laceration without foreign body of other part of head, initial encounter: Secondary | ICD-10-CM

## 2012-01-17 DIAGNOSIS — F411 Generalized anxiety disorder: Secondary | ICD-10-CM | POA: Insufficient documentation

## 2012-01-17 DIAGNOSIS — E785 Hyperlipidemia, unspecified: Secondary | ICD-10-CM | POA: Insufficient documentation

## 2012-01-17 DIAGNOSIS — J4489 Other specified chronic obstructive pulmonary disease: Secondary | ICD-10-CM | POA: Insufficient documentation

## 2012-01-17 DIAGNOSIS — S0180XA Unspecified open wound of other part of head, initial encounter: Secondary | ICD-10-CM | POA: Insufficient documentation

## 2012-01-17 DIAGNOSIS — J449 Chronic obstructive pulmonary disease, unspecified: Secondary | ICD-10-CM | POA: Insufficient documentation

## 2012-01-17 DIAGNOSIS — Z8673 Personal history of transient ischemic attack (TIA), and cerebral infarction without residual deficits: Secondary | ICD-10-CM | POA: Insufficient documentation

## 2012-01-17 DIAGNOSIS — W07XXXA Fall from chair, initial encounter: Secondary | ICD-10-CM | POA: Insufficient documentation

## 2012-01-17 DIAGNOSIS — E119 Type 2 diabetes mellitus without complications: Secondary | ICD-10-CM | POA: Insufficient documentation

## 2012-01-17 DIAGNOSIS — I10 Essential (primary) hypertension: Secondary | ICD-10-CM | POA: Insufficient documentation

## 2012-01-17 DIAGNOSIS — Y93E1 Activity, personal bathing and showering: Secondary | ICD-10-CM | POA: Insufficient documentation

## 2012-01-17 DIAGNOSIS — K219 Gastro-esophageal reflux disease without esophagitis: Secondary | ICD-10-CM | POA: Insufficient documentation

## 2012-01-17 DIAGNOSIS — Y9210 Unspecified residential institution as the place of occurrence of the external cause: Secondary | ICD-10-CM | POA: Insufficient documentation

## 2012-01-17 MED ORDER — LIDOCAINE-EPINEPHRINE-TETRACAINE (LET) SOLUTION
3.0000 mL | Freq: Once | NASAL | Status: AC
  Administered 2012-01-17: 3 mL via TOPICAL
  Filled 2012-01-17: qty 3

## 2012-01-17 NOTE — ED Notes (Signed)
[facility] chair and slipped - hit head, small laceration above left eyebrow. approx 1". Bleeding controlled. No loc, just fell forward out of chair. 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

## 2012-01-17 NOTE — ED Provider Notes (Signed)
History     CSN: [id number]  Arrival date & time 01/17/12  1639   First MD Initiated Contact with Patient 01/17/12 1659      Chief Complaint  Patient presents with  . Facial Laceration    Level V caveat aphasia HPI The patient is a resident of [facility]. He has history of stroke with right hemiparesis. The patient was in a shower chair and slipped falling forward and hitting his head. Patient sustained a small laceration. He states he did lose consciousness although reports indicate that he did not. He denies any neck pain back pain chest pain abdominal pain or other injury. Patient states the pain is mild. Nothing particular makes it worse Past Medical History  Diagnosis Date  . 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 Acute conjunctivitis, unspecified   . Aortic aneurysm of unspecified site without mention of rupture   . Flaccid hemiplegia affecting dominant side   . Type II or unspecified type diabetes mellitus without mention of complication, not stated as uncontrolled   . Depressive disorder, not elsewhere classified   . Unspecified essential hypertension   . Obstructive chronic bronchitis with exacerbation   . Anxiety state, unspecified   . Esophageal reflux   . Other and unspecified hyperlipidemia   . Pressure ulcer, other site(707.09)   . Dementia   . Chronic kidney disease 08/04/2011    Acute renal failure per note 08/04/2011-Dr. Art Chilton Si  . Stroke     Past Surgical History  Procedure Date  . Cystoscopy w/ ureteral stent placement 08/07/2011    Procedure: CYSTOSCOPY WITH RETROGRADE PYELOGRAM/URETERAL STENT PLACEMENT;  Surgeon: Kathi Ludwig, MD;  Location: [facility];  Service: Urology;  Laterality: Bilateral;  bilateral stent placement ureterosopy  . Cystoscopy/retrograde/ureteroscopy 12/07/2011    Procedure: CYSTOSCOPY/RETROGRADE/URETEROSCOPY;  Surgeon: Kathi Ludwig, MD;  Location: [facility];  Service:  Urology;  Laterality: Bilateral;  . Cystoscopy w/ ureteral stent placement 12/07/2011    Procedure: CYSTOSCOPY WITH STENT REPLACEMENT;  Surgeon: Kathi Ludwig, MD;  Location: [facility];  Service: Urology;  Laterality: Bilateral;  (BIL) JJ STENT EXCHANGE  . Cystoscopy with urethral dilatation 12/07/2011    Procedure: CYSTOSCOPY WITH URETHRAL DILATATION;  Surgeon: Kathi Ludwig, MD;  Location: [facility];  Service: Urology;;    No family history on file.  History  Substance Use Topics  . Smoking status: Never Smoker   . Smokeless tobacco: Not on file  . Alcohol Use: No      Review of Systems  All other systems reviewed and are negative.    Allergies  Review of patient's allergies indicates no known allergies.  Home Medications   Current Outpatient Rx  Name Route Sig Dispense Refill  . ALBUTEROL SULFATE (5 MG/ML) 0.5% IN NEBU Nebulization Take 2.5 mg by nebulization every 2 (two) hours as needed. For shortness of breath or wheezing.    Marland Kitchen ALPRAZOLAM 0.5 MG PO TABS Oral Take 0.5 mg by mouth daily as needed. For panic attacks.    Marland Kitchen PROTEINEX PO LIQD Oral Take 30 mLs by mouth every morning.    . ASPIRIN EC 81 MG PO TBEC Oral Take 81 mg by mouth daily.    Marland Kitchen CITALOPRAM HYDROBROMIDE 20 MG PO TABS Oral Take 20 mg by mouth at bedtime.     Marland Kitchen FERROUS SULFATE 325 (65 FE) MG PO TABS Oral Take 325 mg by mouth daily with breakfast.     . FLUTICASONE-SALMETEROL 250-50 MCG/DOSE  IN AEPB Inhalation Inhale 1 puff into the lungs 2 (two) times daily.     . GUAIFENESIN ER 600 MG PO TB12 Oral Take 600 mg by mouth 2 (two) times daily.     . INSULIN ASPART 100 UNIT/ML Maxwell SOLN Subcutaneous Inject 5 Units into the skin 4 (four) times daily -  before meals and at bedtime. Give for CBG > 150. Before meals and at bedtime    . INSULIN GLARGINE 100 UNIT/ML  SOLN Subcutaneous Inject 5 Units into the skin at bedtime.     . IPRATROPIUM-ALBUTEROL 0.5-2.5 (3) MG/3ML IN SOLN Nebulization Take 3 mLs by  nebulization every 6 (six) hours as needed. For shortness of breath    . THERA M PLUS PO TABS Oral Take 1 tablet by mouth daily.     . OXYCODONE HCL 5 MG PO TABS Oral Take 2 tablets (10 mg total) by mouth every 4 (four) hours as needed. For pain 30 tablet 0  . POLYETHYLENE GLYCOL 3350 PO PACK Oral Take 17 g by mouth daily.    Marland Kitchen PROMETHAZINE HCL 25 MG PO TABS Oral Take 25 mg by mouth every 6 (six) hours as needed. For nausea    . RANITIDINE HCL 75 MG PO TABS Oral Take 75 mg by mouth at bedtime.    Marland Kitchen SIMVASTATIN 40 MG PO TABS Oral Take 40 mg by mouth at bedtime.     . THIAMINE HCL 100 MG PO TABS Oral Take 100 mg by mouth every morning.       BP 153/78  Pulse 76  Temp 99.2 F (37.3 C) (Oral)  Resp 20  SpO2 100%  Physical Exam  Nursing note and vitals reviewed. Constitutional: He appears well-developed and well-nourished. No distress.  HENT:  Head: Normocephalic.  Right Ear: External ear normal.  Left Ear: External ear normal.       Proximally 1 cm forehead laceration small amount of bleeding no pulsatile flow  Eyes: Conjunctivae normal are normal. Right eye exhibits no discharge. Left eye exhibits no discharge. No scleral icterus.  Neck: Neck supple. No tracheal deviation present.  Cardiovascular: Normal rate, regular rhythm and intact distal pulses.   Pulmonary/Chest: Effort normal and breath sounds normal. No stridor. No respiratory distress. He has no wheezes. He has no rales.  Abdominal: Soft. Bowel sounds are normal. He exhibits no distension. There is no tenderness. There is no rebound and no guarding.  Musculoskeletal: He exhibits no edema and no tenderness.       Cervical back: He exhibits normal range of motion and no tenderness.       Thoracic back: He exhibits normal range of motion and no tenderness.       Lumbar back: He exhibits normal range of motion and no tenderness.  Neurological: He is alert. He has normal strength. A cranial nerve deficit (Aphasia) is present. No  sensory deficit. He exhibits normal muscle tone. He displays no seizure activity. Coordination normal.       Right hemiplegia  Skin: Skin is warm and dry. No rash noted.  Psychiatric: He has a normal mood and affect.    ED Course  Procedures (including critical care time) LACERATION REPAIR Performed by: ONGEX,BMW R Consent: Verbal consent obtained. Risks and benefits: risks, benefits and alternatives were discussed Patient identity confirmed: provided demographic data Time out performed prior to procedure Prepped and Draped in normal sterile fashion Wound explored Laceration Location: Forehead Laceration Length: 1 cm No Foreign Bodies seen or palpated Anesthesia:  local infiltration Local anesthetic: let Anesthetic total:  Irrigation method: syringe Amount of cleaning: standard Skin closure: Dermabond  Number of sutures or staples: Not applicable  Technique: Not applicable  Patient tolerance: Patient tolerated the procedure well with no immediate complications.  Labs Reviewed - No data to display Ct Head Wo Contrast  01/17/2012  *RADIOLOGY REPORT*  Clinical Data: Facial laceration status post fall.  History of stroke.  CT HEAD WITHOUT CONTRAST  Technique:  Contiguous axial images were obtained from the base of the skull through the vertex without contrast.  Comparison: 08/04/2011 and 07/11/2010.  Findings: There is stable encephalomalacia within the left middle cerebral artery distribution consistent with an old stroke.  No acute intracranial hemorrhage, mass lesion, brain edema or extra- axial fluid collection is seen.  There is generalized atrophy and mild chronic small vessel ischemic change.  Again demonstrated is diffuse paranasal sinus inflammatory change with mucosal thickening and near complete opacification of the ethmoid sinuses bilaterally.  There are air-fluid levels in the maxillary and frontal sinuses bilaterally.  The mastoids and middle ears are clear.  The calvarium is  intact.  IMPRESSION:  1.  No acute intracranial findings identified. 2.  Old left MCA territory infarct. 3.  Pansinusitis with air-fluid levels suggesting an acute component.   Original Report Authenticated By: Gerrianne Scale, M.D.      1. Forehead laceration       MDM  Patient status post mechanical fall without signs of intracranial injury. Laceration repaired with Dermabond. Patient tolerated well        Celene Kras, MD 01/18/12 [phone]

## 2012-01-17 NOTE — ED Notes (Signed)
Bed:RESA<BR> Expected date:<BR> Expected time:<BR> Means of arrival:<BR> Comments:<BR> ems

## 2012-05-27 ENCOUNTER — Other Ambulatory Visit (HOSPITAL_COMMUNITY): Payer: Self-pay | Admitting: Interventional Radiology

## 2012-05-27 ENCOUNTER — Other Ambulatory Visit (HOSPITAL_COMMUNITY): Payer: Self-pay | Admitting: Diagnostic Radiology

## 2012-05-27 DIAGNOSIS — B999 Unspecified infectious disease: Secondary | ICD-10-CM

## 2012-05-28 ENCOUNTER — Ambulatory Visit (HOSPITAL_COMMUNITY)
Admission: RE | Admit: 2012-05-28 | Discharge: 2012-05-28 | Disposition: A | Discharge status: Home / Self Care | Payer: Medicare Other | Source: Ambulatory Visit | Attending: Interventional Radiology | Admitting: Interventional Radiology

## 2012-05-28 ENCOUNTER — Other Ambulatory Visit (HOSPITAL_COMMUNITY): Payer: Self-pay | Admitting: Interventional Radiology

## 2012-05-28 DIAGNOSIS — B999 Unspecified infectious disease: Secondary | ICD-10-CM

## 2012-05-28 DIAGNOSIS — N390 Urinary tract infection, site not specified: Secondary | ICD-10-CM | POA: Insufficient documentation

## 2012-05-28 NOTE — Procedures (Signed)
R SL PICC 43cm to SVC/RA jct No complication No blood loss. See complete dictation in [facility].

## 2012-05-29 ENCOUNTER — Telehealth (HOSPITAL_COMMUNITY): Payer: Self-pay | Admitting: *Deleted

## 2012-07-02 ENCOUNTER — Non-Acute Institutional Stay (SKILLED_NURSING_FACILITY): Payer: Medicare Other | Admitting: Internal Medicine

## 2012-07-02 DIAGNOSIS — J441 Chronic obstructive pulmonary disease with (acute) exacerbation: Secondary | ICD-10-CM

## 2012-07-02 DIAGNOSIS — F0390 Unspecified dementia without behavioral disturbance: Secondary | ICD-10-CM

## 2012-07-02 DIAGNOSIS — E119 Type 2 diabetes mellitus without complications: Secondary | ICD-10-CM

## 2012-07-02 DIAGNOSIS — N201 Calculus of ureter: Secondary | ICD-10-CM

## 2012-07-03 ENCOUNTER — Other Ambulatory Visit (HOSPITAL_COMMUNITY): Payer: Self-pay | Admitting: Internal Medicine

## 2012-07-03 DIAGNOSIS — R0602 Shortness of breath: Secondary | ICD-10-CM

## 2012-07-06 ENCOUNTER — Encounter: Payer: Self-pay | Admitting: Internal Medicine

## 2012-07-06 DIAGNOSIS — N201 Calculus of ureter: Secondary | ICD-10-CM | POA: Insufficient documentation

## 2012-07-06 NOTE — Assessment & Plan Note (Signed)
No current exacerbation.  No active wheezing and good air movement today.  Is on chronic oxygen 2L, advair and neb treatments.  Evaluate PFTs prior to any surgeries b/c my impression is that he will be hard to wean from the vent.

## 2012-07-06 NOTE — Assessment & Plan Note (Signed)
Has been fairly well controlled with low doses of lantus and as needed novolog with meals.  Is on statin, but bp runs low and has not permitted ACE/ARB.

## 2012-07-06 NOTE — Assessment & Plan Note (Signed)
Will obtain cbc, cmp, hba1c, pfts and ekg prior to his surgery..plan is for ureteroscopy for stone removal.

## 2012-07-06 NOTE — Assessment & Plan Note (Signed)
Unclear if he has dementia.  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  Usually is appropriate in responses.  Ideally should be accompanied by a family member or staff member for his testing, especially PFTs

## 2012-07-06 NOTE — Progress Notes (Signed)
Patient ID: Justin Sherman, male   DOB: Mar 03, 1941, 72 y.o.   MRN: [id number]  Chief Complaint: medical clearance for uteroscopy with stone removal  HPI:  72 yo male with h/o COPD with frequent exacerbations currently on advair and neb treatments w/o any documented PFTs, anxiety with prior panic attacks, controlled DMII, prior stroke with hemiparesis and aphasia, depression, hyperlipidemia and ureteral stones for which he has previously undergone cystoscopy and exchange of bilateral ureteral stents in august of last year.  He is alert and appropriate, but unable to converse appropriately due to his aphasia.  He is typically up in his wheelchair and does participate in activities.  He can follow commands.    Review of Systems:  Review of Systems  Constitutional: Negative for fever, chills, weight loss and malaise/fatigue.  Eyes:       No visual changes  Respiratory: Positive for cough, sputum production, shortness of breath and wheezing.        All chronic, no increase in sputum or cough recently  Cardiovascular: Negative for chest pain, palpitations, orthopnea, leg swelling and PND.  Gastrointestinal: Negative for heartburn, nausea, vomiting, abdominal pain, diarrhea and constipation.  Genitourinary: Negative for dysuria.  Musculoskeletal: Negative for myalgias.  Skin: Negative for rash.  Neurological: Negative for dizziness and headaches.       Hemiparetic s/p stroke years ago, aphasic  Psychiatric/Behavioral:       H/o depression and anxiety with panic attacks on celexa and was recently switched from ativan to xanax by psychiatry     Medications: Patient's Medications  New Prescriptions   No medications on file  Previous Medications   ALBUTEROL (PROVENTIL) (5 MG/ML) 0.5% NEBULIZER SOLUTION    Take 2.5 mg by nebulization every 2 (two) hours as needed. For shortness of breath or wheezing.   ALPRAZOLAM (XANAX) 0.5 MG TABLET    Take 0.5 mg by mouth daily as needed. For panic attacks.    AMINO ACIDS-PROTEIN HYDROLYS (PROTEINEX) LIQD    Take 30 mLs by mouth every morning.   ASPIRIN EC 81 MG TABLET    Take 81 mg by mouth daily.   CITALOPRAM (CELEXA) 20 MG TABLET    Take 20 mg by mouth at bedtime.    FERROUS SULFATE 325 (65 FE) MG TABLET    Take 325 mg by mouth daily with breakfast.    FLUTICASONE-SALMETEROL (ADVAIR) 250-50 MCG/DOSE AEPB    Inhale 1 puff into the lungs 2 (two) times daily.    GUAIFENESIN (MUCINEX) 600 MG 12 HR TABLET    Take 600 mg by mouth 2 (two) times daily.    INSULIN ASPART (NOVOLOG) 100 UNIT/ML INJECTION    Inject 5 Units into the skin 4 (four) times daily -  before meals and at bedtime. Give for CBG > 150. Before meals and at bedtime   INSULIN GLARGINE (LANTUS) 100 UNIT/ML INJECTION    Inject 5 Units into the skin at bedtime.    IPRATROPIUM-ALBUTEROL (DUONEB) 0.5-2.5 (3) MG/3ML SOLN    Take 3 mLs by nebulization every 6 (six) hours as needed. For shortness of breath   MULTIPLE VITAMINS-MINERALS (MULTIVITAMINS THER. W/MINERALS) TABS    Take 1 tablet by mouth daily.    OXYCODONE (OXY IR/ROXICODONE) 5 MG IMMEDIATE RELEASE TABLET    Take 2 tablets (10 mg total) by mouth every 4 (four) hours as needed. For pain   POLYETHYLENE GLYCOL (MIRALAX / GLYCOLAX) PACKET    Take 17 g by mouth daily.   PROMETHAZINE (PHENERGAN) 25 MG TABLET  Take 25 mg by mouth every 6 (six) hours as needed. For nausea   RANITIDINE (ZANTAC) 75 MG TABLET    Take 75 mg by mouth at bedtime.   SIMVASTATIN (ZOCOR) 40 MG TABLET    Take 40 mg by mouth at bedtime.    THIAMINE 100 MG TABLET    Take 100 mg by mouth every morning.   Modified Medications   No medications on file  Discontinued Medications   No medications on file     Physical Exam: There were no vitals filed for this visit. Physical Exam  Constitutional: He appears well-developed and well-nourished.  HENT:  Head: Normocephalic and atraumatic.  Eyes: Conjunctivae and EOM are normal. Pupils are equal, round, and reactive to  light. Right eye exhibits no discharge. Left eye exhibits no discharge. No scleral icterus.  Neck: Normal range of motion. No JVD present. No tracheal deviation present. No thyromegaly present.  Cardiovascular: Regular rhythm, normal heart sounds and intact distal pulses.  Exam reveals no gallop and no friction rub.   No murmur heard. Pulmonary/Chest: Effort normal and breath sounds normal. No respiratory distress. He has no wheezes. He has no rales. He exhibits no tenderness.  Abdominal: Soft. Bowel sounds are normal. He exhibits no distension and no mass. There is no tenderness.  Musculoskeletal:  Hemiparesis  Neurological: He is alert. He displays abnormal reflex. He exhibits abnormal muscle tone. Coordination abnormal.  Aphasic, able to answer yes and no, usually appropriate, some small phrases  Psychiatric: He has a normal mood and affect.      Labs reviewed: Basic Metabolic Panel:  Recent Labs  16/10/96 0543 09/01/11 0128 12/07/11 0945  NA 136 132* 138  K 3.5 4.1 4.5  CL 101 97 101  CO2 26 26 32  GLUCOSE 88 136* 90  BUN 18 27* 27*  CREATININE 1.93* 2.31* 1.91*  CALCIUM 9.1 9.5 10.1    Liver Function Tests:  Recent Labs  08/04/11 1500 08/10/11 2025  AST 18 25  ALT 10 21  ALKPHOS 67 64  BILITOT 0.3 0.2*  PROT 7.0 6.3  ALBUMIN 3.3* 2.3*    CBC:  Recent Labs  08/10/11 2025 08/12/11 0543 09/01/11 0128 12/07/11 0945  WBC 15.7* 12.6* 20.9* 8.9  NEUTROABS 12.9* 9.6* 16.5*  --   HGB 7.5* 7.2* 8.1* 9.4*  HCT 23.7* 22.6* 25.8* 30.3*  MCV 86.2 86.3 86.6 85.8  PLT 285 305 162 202    Significant Diagnostic Results: CT-scan of abdomen and pelvis reviewed from [facility]   Assessment/Plan No problem-specific assessment & plan notes found for this encounter.     Family/ staff Communication: Discussed with unit supervisor and NP.     Labs/tests ordered:  PFTs, EKG, cbc, cmp next draw

## 2012-07-10 ENCOUNTER — Ambulatory Visit (HOSPITAL_COMMUNITY)
Admission: RE | Admit: 2012-07-10 | Discharge: 2012-07-10 | Disposition: A | Discharge status: Home / Self Care | Payer: Medicare Other | Source: Ambulatory Visit | Attending: Internal Medicine | Admitting: Internal Medicine

## 2012-07-10 DIAGNOSIS — R0602 Shortness of breath: Secondary | ICD-10-CM | POA: Insufficient documentation

## 2012-07-10 MED ORDER — ALBUTEROL SULFATE (5 MG/ML) 0.5% IN NEBU
2.5000 mg | INHALATION_SOLUTION | Freq: Once | RESPIRATORY_TRACT | Status: AC
  Administered 2012-07-10: 2.5 mg via RESPIRATORY_TRACT

## 2012-07-29 ENCOUNTER — Other Ambulatory Visit: Payer: Self-pay | Admitting: Internal Medicine

## 2012-07-29 DIAGNOSIS — N390 Urinary tract infection, site not specified: Secondary | ICD-10-CM

## 2012-07-30 ENCOUNTER — Other Ambulatory Visit: Payer: Self-pay | Admitting: Internal Medicine

## 2012-07-30 ENCOUNTER — Ambulatory Visit (HOSPITAL_COMMUNITY)
Admission: RE | Admit: 2012-07-30 | Discharge: 2012-07-30 | Disposition: A | Discharge status: Home / Self Care | Payer: Medicare Other | Source: Ambulatory Visit | Attending: Internal Medicine | Admitting: Internal Medicine

## 2012-07-30 DIAGNOSIS — N390 Urinary tract infection, site not specified: Secondary | ICD-10-CM

## 2012-07-30 NOTE — Procedures (Signed)
Interventional Radiology Procedure Note  Procedure: Placement of a Right basilic vein 39 cm single lumen PowerPICC.  Tip at the superior CAJ and ready for use. Complications: None Recommendations: - Routine line care  Signed,  Sterling Big, MD Vascular & Interventional Radiologist [facility]

## 2012-08-15 ENCOUNTER — Encounter: Payer: Self-pay | Admitting: Nurse Practitioner

## 2012-08-15 ENCOUNTER — Non-Acute Institutional Stay (SKILLED_NURSING_FACILITY): Payer: Medicare Other | Admitting: Nurse Practitioner

## 2012-08-15 DIAGNOSIS — N390 Urinary tract infection, site not specified: Secondary | ICD-10-CM

## 2012-08-15 DIAGNOSIS — J449 Chronic obstructive pulmonary disease, unspecified: Secondary | ICD-10-CM

## 2012-08-15 DIAGNOSIS — E119 Type 2 diabetes mellitus without complications: Secondary | ICD-10-CM

## 2012-08-15 NOTE — Progress Notes (Signed)
Patient ID: Justin Sherman, male   DOB: [birth year], 72 y.o.   MRN: [id number]  Chief Complaint: medical management of chronic conditions   HPI:  72 yo male with h/o COPD with frequent exacerbations, anxiety, controlled DMII, prior stroke with hemiparesis and aphasia, depression, hyperlipidemia and ureteral stones for which he has previously undergone cystoscopy and exchange of bilateral ureteral stents in august of last year now awaiting uteroscopy with stone removal. He is currently alert and appropriate but due to his aphasia unable to get much history. He is up in his wheelchair  And  follows commands. Staff unsure when his procedure is scheduled. He is currently getting Imipenem q 6 hours via PICC for 2 weeks for UTI  Review of Systems:  Unable due to aphagia   Medications: Patient's Medications  New Prescriptions   No medications on file  Previous Medications   ALBUTEROL (PROVENTIL) (5 MG/ML) 0.5% NEBULIZER SOLUTION    Take 2.5 mg by nebulization every 2 (two) hours as needed. For shortness of breath or wheezing.   AMINO ACIDS-PROTEIN HYDROLYS (PROTEINEX) LIQD    Take 30 mLs by mouth every morning.   ASPIRIN EC 81 MG TABLET    Take 81 mg by mouth daily.   CITALOPRAM (CELEXA) 20 MG TABLET    Take 20 mg by mouth at bedtime.    FERROUS SULFATE 325 (65 FE) MG TABLET    Take 325 mg by mouth daily with breakfast.    FLUTICASONE-SALMETEROL (ADVAIR) 250-50 MCG/DOSE AEPB    Inhale 1 puff into the lungs 2 (two) times daily.    GUAIFENESIN (MUCINEX) 600 MG 12 HR TABLET    Take 600 mg by mouth 2 (two) times daily.    INSULIN ASPART (NOVOLOG) 100 UNIT/ML INJECTION    Inject 5 Units into the skin 4 (four) times daily -  before meals and at bedtime. Give for CBG > 150. Before meals and at bedtime   INSULIN GLARGINE (LANTUS) 100 UNIT/ML INJECTION    Inject 5 Units into the skin at bedtime.    IPRATROPIUM-ALBUTEROL (DUONEB) 0.5-2.5 (3) MG/3ML SOLN    Take 3 mLs by nebulization every 6 (six) hours as  needed. For shortness of breath   LORAZEPAM (ATIVAN) 0.5 MG TABLET    Take 0.5 mg by mouth daily as needed for anxiety.   MULTIPLE VITAMINS-MINERALS (MULTIVITAMINS THER. W/MINERALS) TABS    Take 1 tablet by mouth daily.    OXYCODONE (OXY IR/ROXICODONE) 5 MG IMMEDIATE RELEASE TABLET    Take 2 tablets (10 mg total) by mouth every 4 (four) hours as needed. For pain   POLYETHYLENE GLYCOL (MIRALAX / GLYCOLAX) PACKET    Take 17 g by mouth daily.   RANITIDINE (ZANTAC) 75 MG TABLET    Take 75 mg by mouth at bedtime.   SIMVASTATIN (ZOCOR) 40 MG TABLET    Take 40 mg by mouth at bedtime.    THIAMINE 100 MG TABLET    Take 100 mg by mouth every morning.   Modified Medications   No medications on file  Discontinued Medications   ALPRAZOLAM (XANAX) 0.5 MG TABLET    Take 0.5 mg by mouth daily as needed. For panic attacks.   PROMETHAZINE (PHENERGAN) 25 MG TABLET    Take 25 mg by mouth every 6 (six) hours as needed. For nausea     Physical Exam: Physical Exam  Constitutional: He is well-developed, well-nourished, and in no distress. No distress.  Cardiovascular: Normal rate, regular rhythm and normal heart sounds.  Pulmonary/Chest: Effort normal and breath sounds normal.  Abdominal: Soft. Bowel sounds are normal.  Musculoskeletal: He exhibits no edema and no tenderness.  Right sided weakness due to CVA  Neurological: He is alert.  Skin: Skin is warm and dry. He is not diaphoretic.     Filed Vitals:   08/15/12 1430  BP: 135/77  Pulse: 82  Temp: 98.4 F (36.9 C)  Resp: 20      Labs reviewed: Basic Metabolic Panel:  Recent Labs  81/19/14 0128 12/07/11 0945  NA 132* 138  K 4.1 4.5  CL 97 101  CO2 26 32  GLUCOSE 136* 90  BUN 27* 27*  CREATININE 2.31* 1.91*  CALCIUM 9.5 10.1    Liver Function Tests: No results found for this basename: AST, ALT, ALKPHOS, BILITOT, PROT, ALBUMIN,  in the last 8760 hours  CBC:  Recent Labs  09/01/11 0128 12/07/11 0945  WBC 20.9* 8.9  NEUTROABS  16.5*  --   HGB 8.1* 9.4*  HCT 25.8* 30.3*  MCV 86.6 85.8  PLT 162 202     Assessment/Plan  Type II or unspecified type diabetes mellitus without mention of complication, not stated as uncontrolled - cont current medications. Will cont to follow diabetes.   Dementia Patient is stable; continue current regimen. Will monitor and make changes as necessary.  UTI (lower urinary tract infection)  - cont with IV antibiotics awaiting for uteroscope with stone removal at this time.   COPD, moderate- stable at this time.  cont with current medications    staff Communication: Pt was seen in march for medical clearance for uteroscopy with stone removal however unsure if this procedure has been plan or is still going to be done.

## 2012-08-17 ENCOUNTER — Encounter (HOSPITAL_COMMUNITY): Payer: Self-pay | Admitting: Emergency Medicine

## 2012-08-17 ENCOUNTER — Emergency Department (HOSPITAL_COMMUNITY): Payer: Medicare Other

## 2012-08-17 ENCOUNTER — Inpatient Hospital Stay (HOSPITAL_COMMUNITY)
Admission: EM | Admit: 2012-08-17 | Discharge: 2012-08-20 | DRG: 314 | Disposition: A | Discharge status: Skilled Nursing Facility | Payer: Medicare Other | Attending: Internal Medicine | Admitting: Internal Medicine

## 2012-08-17 DIAGNOSIS — N390 Urinary tract infection, site not specified: Secondary | ICD-10-CM

## 2012-08-17 DIAGNOSIS — N201 Calculus of ureter: Secondary | ICD-10-CM

## 2012-08-17 DIAGNOSIS — J441 Chronic obstructive pulmonary disease with (acute) exacerbation: Secondary | ICD-10-CM

## 2012-08-17 DIAGNOSIS — N039 Chronic nephritic syndrome with unspecified morphologic changes: Secondary | ICD-10-CM | POA: Diagnosis present

## 2012-08-17 DIAGNOSIS — R509 Fever, unspecified: Secondary | ICD-10-CM

## 2012-08-17 DIAGNOSIS — G8100 Flaccid hemiplegia affecting unspecified side: Secondary | ICD-10-CM

## 2012-08-17 DIAGNOSIS — D72829 Elevated white blood cell count, unspecified: Secondary | ICD-10-CM

## 2012-08-17 DIAGNOSIS — T80211A Bloodstream infection due to central venous catheter, initial encounter: Principal | ICD-10-CM | POA: Diagnosis present

## 2012-08-17 DIAGNOSIS — E119 Type 2 diabetes mellitus without complications: Secondary | ICD-10-CM

## 2012-08-17 DIAGNOSIS — T80219A Unspecified infection due to central venous catheter, initial encounter: Secondary | ICD-10-CM

## 2012-08-17 DIAGNOSIS — T80218A Other infection due to central venous catheter, initial encounter: Secondary | ICD-10-CM

## 2012-08-17 DIAGNOSIS — A419 Sepsis, unspecified organism: Secondary | ICD-10-CM

## 2012-08-17 DIAGNOSIS — E1149 Type 2 diabetes mellitus with other diabetic neurological complication: Secondary | ICD-10-CM | POA: Diagnosis present

## 2012-08-17 DIAGNOSIS — D631 Anemia in chronic kidney disease: Secondary | ICD-10-CM | POA: Diagnosis present

## 2012-08-17 DIAGNOSIS — Z792 Long term (current) use of antibiotics: Secondary | ICD-10-CM

## 2012-08-17 DIAGNOSIS — F329 Major depressive disorder, single episode, unspecified: Secondary | ICD-10-CM

## 2012-08-17 DIAGNOSIS — R339 Retention of urine, unspecified: Secondary | ICD-10-CM

## 2012-08-17 DIAGNOSIS — Z794 Long term (current) use of insulin: Secondary | ICD-10-CM

## 2012-08-17 DIAGNOSIS — F0390 Unspecified dementia without behavioral disturbance: Secondary | ICD-10-CM

## 2012-08-17 DIAGNOSIS — Z452 Encounter for adjustment and management of vascular access device: Secondary | ICD-10-CM

## 2012-08-17 DIAGNOSIS — E785 Hyperlipidemia, unspecified: Secondary | ICD-10-CM

## 2012-08-17 DIAGNOSIS — D649 Anemia, unspecified: Secondary | ICD-10-CM | POA: Diagnosis present

## 2012-08-17 DIAGNOSIS — F32A Depression, unspecified: Secondary | ICD-10-CM

## 2012-08-17 DIAGNOSIS — K219 Gastro-esophageal reflux disease without esophagitis: Secondary | ICD-10-CM | POA: Diagnosis present

## 2012-08-17 DIAGNOSIS — Z79899 Other long term (current) drug therapy: Secondary | ICD-10-CM

## 2012-08-17 DIAGNOSIS — E1169 Type 2 diabetes mellitus with other specified complication: Secondary | ICD-10-CM | POA: Diagnosis present

## 2012-08-17 DIAGNOSIS — F3289 Other specified depressive episodes: Secondary | ICD-10-CM | POA: Diagnosis present

## 2012-08-17 DIAGNOSIS — R209 Unspecified disturbances of skin sensation: Secondary | ICD-10-CM

## 2012-08-17 DIAGNOSIS — J4489 Other specified chronic obstructive pulmonary disease: Secondary | ICD-10-CM | POA: Diagnosis present

## 2012-08-17 DIAGNOSIS — J449 Chronic obstructive pulmonary disease, unspecified: Secondary | ICD-10-CM

## 2012-08-17 DIAGNOSIS — T80212A Local infection due to central venous catheter, initial encounter: Secondary | ICD-10-CM | POA: Diagnosis present

## 2012-08-17 DIAGNOSIS — Y849 Medical procedure, unspecified as the cause of abnormal reaction of the patient, or of later complication, without mention of misadventure at the time of the procedure: Secondary | ICD-10-CM | POA: Diagnosis present

## 2012-08-17 DIAGNOSIS — N183 Chronic kidney disease, stage 3 unspecified: Secondary | ICD-10-CM

## 2012-08-17 DIAGNOSIS — N179 Acute kidney failure, unspecified: Secondary | ICD-10-CM

## 2012-08-17 DIAGNOSIS — I69998 Other sequelae following unspecified cerebrovascular disease: Secondary | ICD-10-CM

## 2012-08-17 LAB — URINE MICROSCOPIC-ADD ON

## 2012-08-17 LAB — CBC WITH DIFFERENTIAL/PLATELET
Basophils Absolute: 0.1 10*3/uL (ref 0.0–0.1)
Basophils Relative: 0 % (ref 0–1)
Eosinophils Relative: 1 % (ref 0–5)
HCT: 34.3 % — ABNORMAL LOW (ref 39.0–52.0)
MCH: 27 pg (ref 26.0–34.0)
MCHC: 31.8 g/dL (ref 30.0–36.0)
MCV: 85.1 fL (ref 78.0–100.0)
Monocytes Absolute: 1.3 10*3/uL — ABNORMAL HIGH (ref 0.1–1.0)
RDW: 14.9 % (ref 11.5–15.5)

## 2012-08-17 LAB — COMPREHENSIVE METABOLIC PANEL
AST: 11 U/L (ref 0–37)
Albumin: 3.1 g/dL — ABNORMAL LOW (ref 3.5–5.2)
CO2: 25 mEq/L (ref 19–32)
Calcium: 9.8 mg/dL (ref 8.4–10.5)
Creatinine, Ser: 1.93 mg/dL — ABNORMAL HIGH (ref 0.50–1.35)
GFR calc non Af Amer: 33 mL/min — ABNORMAL LOW (ref >90)

## 2012-08-17 LAB — URINALYSIS, ROUTINE W REFLEX MICROSCOPIC
Bilirubin Urine: NEGATIVE
Ketones, ur: NEGATIVE mg/dL
Specific Gravity, Urine: 1.017 (ref 1.005–1.030)
Urobilinogen, UA: 0.2 mg/dL (ref 0.0–1.0)

## 2012-08-17 LAB — HEMOGLOBIN A1C: Hgb A1c MFr Bld: 6.1 % — ABNORMAL HIGH (ref <5.7)

## 2012-08-17 MED ORDER — MOMETASONE FURO-FORMOTEROL FUM 100-5 MCG/ACT IN AERO
2.0000 | INHALATION_SPRAY | Freq: Two times a day (BID) | RESPIRATORY_TRACT | Status: DC
  Administered 2012-08-18 – 2012-08-20 (×5): 2 via RESPIRATORY_TRACT
  Filled 2012-08-17: qty 8.8

## 2012-08-17 MED ORDER — ONDANSETRON HCL 4 MG/2ML IJ SOLN
4.0000 mg | Freq: Once | INTRAMUSCULAR | Status: AC
Start: 2012-08-17 — End: 2012-08-17
  Administered 2012-08-17: 4 mg via INTRAVENOUS
  Filled 2012-08-17: qty 2

## 2012-08-17 MED ORDER — INSULIN GLARGINE 100 UNIT/ML ~~LOC~~ SOLN
5.0000 [IU] | Freq: Every day | SUBCUTANEOUS | Status: DC
  Administered 2012-08-17 – 2012-08-19 (×3): 5 [IU] via SUBCUTANEOUS
  Filled 2012-08-17 (×4): qty 0.05

## 2012-08-17 MED ORDER — LORAZEPAM 0.5 MG PO TABS: 0.5000 mg | ORAL_TABLET | Freq: Every day | ORAL | Status: DC | PRN

## 2012-08-17 MED ORDER — SIMVASTATIN 40 MG PO TABS
40.0000 mg | ORAL_TABLET | Freq: Every day | ORAL | Status: DC
  Administered 2012-08-17 – 2012-08-19 (×3): 40 mg via ORAL
  Filled 2012-08-17 (×4): qty 1

## 2012-08-17 MED ORDER — ENOXAPARIN SODIUM 40 MG/0.4ML ~~LOC~~ SOLN
40.0000 mg | SUBCUTANEOUS | Status: DC
  Administered 2012-08-17 – 2012-08-18 (×2): 40 mg via SUBCUTANEOUS
  Filled 2012-08-17 (×3): qty 0.4

## 2012-08-17 MED ORDER — VITAMIN B-1 100 MG PO TABS
100.0000 mg | ORAL_TABLET | Freq: Every morning | ORAL | Status: DC
  Administered 2012-08-18 – 2012-08-20 (×3): 100 mg via ORAL
  Filled 2012-08-17 (×3): qty 1

## 2012-08-17 MED ORDER — CITALOPRAM HYDROBROMIDE 20 MG PO TABS
20.0000 mg | ORAL_TABLET | Freq: Every day | ORAL | Status: DC
  Administered 2012-08-17 – 2012-08-19 (×3): 20 mg via ORAL
  Filled 2012-08-17 (×4): qty 1

## 2012-08-17 MED ORDER — SODIUM CHLORIDE 0.9 % IV BOLUS (SEPSIS)
1000.0000 mL | Freq: Once | INTRAVENOUS | Status: AC
  Administered 2012-08-17: 1000 mL via INTRAVENOUS

## 2012-08-17 MED ORDER — ALBUTEROL SULFATE (5 MG/ML) 0.5% IN NEBU: 2.5000 mg | INHALATION_SOLUTION | RESPIRATORY_TRACT | Status: DC | PRN

## 2012-08-17 MED ORDER — VANCOMYCIN HCL IN DEXTROSE 1-5 GM/200ML-% IV SOLN
1000.0000 mg | INTRAVENOUS | Status: DC
  Administered 2012-08-17 – 2012-08-19 (×3): 1000 mg via INTRAVENOUS
  Filled 2012-08-17 (×3): qty 200

## 2012-08-17 MED ORDER — ACETAMINOPHEN 650 MG RE SUPP: 650.0000 mg | Freq: Four times a day (QID) | RECTAL | Status: DC | PRN

## 2012-08-17 MED ORDER — INSULIN ASPART 100 UNIT/ML ~~LOC~~ SOLN
0.0000 [IU] | Freq: Three times a day (TID) | SUBCUTANEOUS | Status: DC
  Administered 2012-08-18: 12:00:00 via SUBCUTANEOUS
  Administered 2012-08-19: 3 [IU] via SUBCUTANEOUS

## 2012-08-17 MED ORDER — POLYETHYLENE GLYCOL 3350 17 G PO PACK
17.0000 g | PACK | Freq: Every day | ORAL | Status: DC
  Administered 2012-08-18 – 2012-08-20 (×3): 17 g via ORAL
  Filled 2012-08-17 (×4): qty 1

## 2012-08-17 MED ORDER — FAMOTIDINE 10 MG PO TABS
10.0000 mg | ORAL_TABLET | Freq: Every day | ORAL | Status: DC
  Administered 2012-08-18 – 2012-08-20 (×3): 10 mg via ORAL
  Filled 2012-08-17 (×4): qty 1

## 2012-08-17 MED ORDER — CHLORHEXIDINE GLUCONATE CLOTH 2 % EX PADS
6.0000 | MEDICATED_PAD | Freq: Every day | CUTANEOUS | Status: DC
  Administered 2012-08-18 – 2012-08-20 (×2): 6 via TOPICAL

## 2012-08-17 MED ORDER — ADULT MULTIVITAMIN W/MINERALS CH
1.0000 | ORAL_TABLET | Freq: Every day | ORAL | Status: DC
  Administered 2012-08-18 – 2012-08-20 (×3): 1 via ORAL
  Filled 2012-08-17 (×3): qty 1

## 2012-08-17 MED ORDER — FERROUS SULFATE 325 (65 FE) MG PO TABS
325.0000 mg | ORAL_TABLET | Freq: Every day | ORAL | Status: DC
  Administered 2012-08-18 – 2012-08-20 (×3): 325 mg via ORAL
  Filled 2012-08-17 (×5): qty 1

## 2012-08-17 MED ORDER — ACETAMINOPHEN 325 MG PO TABS
650.0000 mg | ORAL_TABLET | Freq: Four times a day (QID) | ORAL | Status: DC | PRN
  Administered 2012-08-17 – 2012-08-18 (×2): 650 mg via ORAL
  Filled 2012-08-17 (×2): qty 2

## 2012-08-17 MED ORDER — OXYCODONE HCL 5 MG PO TABS: 5.0000 mg | ORAL_TABLET | ORAL | Status: DC | PRN

## 2012-08-17 MED ORDER — PIPERACILLIN-TAZOBACTAM 3.375 G IVPB
3.3750 g | Freq: Three times a day (TID) | INTRAVENOUS | Status: DC
  Administered 2012-08-17 – 2012-08-20 (×8): 3.375 g via INTRAVENOUS
  Filled 2012-08-17 (×9): qty 50

## 2012-08-17 MED ORDER — MUPIROCIN 2 % EX OINT
1.0000 "application " | TOPICAL_OINTMENT | Freq: Two times a day (BID) | CUTANEOUS | Status: DC
  Administered 2012-08-17 – 2012-08-20 (×6): 1 via NASAL
  Filled 2012-08-17: qty 22

## 2012-08-17 MED ORDER — ONDANSETRON HCL 4 MG/2ML IJ SOLN: 4.0000 mg | Freq: Four times a day (QID) | INTRAMUSCULAR | Status: DC | PRN

## 2012-08-17 MED ORDER — SODIUM CHLORIDE 0.9 % IJ SOLN
3.0000 mL | Freq: Two times a day (BID) | INTRAMUSCULAR | Status: DC
  Administered 2012-08-18 – 2012-08-20 (×4): 3 mL via INTRAVENOUS

## 2012-08-17 MED ORDER — INSULIN ASPART 100 UNIT/ML ~~LOC~~ SOLN
4.0000 [IU] | Freq: Three times a day (TID) | SUBCUTANEOUS | Status: DC
  Administered 2012-08-18: 4 [IU] via SUBCUTANEOUS
  Administered 2012-08-18: 11:00:00 via SUBCUTANEOUS
  Administered 2012-08-19 – 2012-08-20 (×2): 4 [IU] via SUBCUTANEOUS

## 2012-08-17 MED ORDER — SODIUM CHLORIDE 0.9 % IJ SOLN: 3.0000 mL | INTRAMUSCULAR | Status: DC | PRN

## 2012-08-17 MED ORDER — SENNOSIDES-DOCUSATE SODIUM 8.6-50 MG PO TABS
1.0000 | ORAL_TABLET | Freq: Every evening | ORAL | Status: DC | PRN
  Filled 2012-08-17: qty 1

## 2012-08-17 MED ORDER — GUAIFENESIN ER 600 MG PO TB12
600.0000 mg | ORAL_TABLET | Freq: Two times a day (BID) | ORAL | Status: DC
  Administered 2012-08-17 – 2012-08-20 (×6): 600 mg via ORAL
  Filled 2012-08-17 (×7): qty 1

## 2012-08-17 MED ORDER — ASPIRIN EC 81 MG PO TBEC
81.0000 mg | DELAYED_RELEASE_TABLET | Freq: Every day | ORAL | Status: DC
Start: 2012-08-17 — End: 2012-08-20
  Administered 2012-08-17 – 2012-08-20 (×4): 81 mg via ORAL
  Filled 2012-08-17 (×4): qty 1

## 2012-08-17 MED ORDER — ONDANSETRON HCL 4 MG PO TABS: 4.0000 mg | ORAL_TABLET | Freq: Four times a day (QID) | ORAL | Status: DC | PRN

## 2012-08-17 MED ORDER — SODIUM CHLORIDE 0.9 % IV SOLN: 250.0000 mL | INTRAVENOUS | Status: DC | PRN

## 2012-08-17 NOTE — Progress Notes (Signed)
Patient arrived to unit in stable condition, alert to self. Patient required 2 person assist with transfer from stretcher to bed. Will continue to monitor throughout shift.

## 2012-08-17 NOTE — ED Provider Notes (Signed)
History     CSN: [id number]  Arrival date & time 08/17/12  4098   First MD Initiated Contact with Patient 08/17/12 0940      Chief Complaint  Patient presents with  . Nausea  . Emesis    (Consider location/radiation/quality/duration/timing/severity/associated sxs/prior treatment) HPI Comments: Patient is a 72 year old male who presents here today from been living nursing home. He was sent to the ED via EMS because he was noted to have a fever and the area surrounding his PICC line looked infected. He has a PICC line to receive IV imipenem for a UTI that is resistant to essentially everything else. He vomited once this a.m. Patient is a poor historian and no one is in the room with him. When asked patient states he feels well.   The history is provided by the patient. No language interpreter was used.    Past Medical History  Diagnosis Date  . 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 Acute conjunctivitis, unspecified   . Aortic aneurysm of unspecified site without mention of rupture   . Flaccid hemiplegia affecting dominant side   . Type II or unspecified type diabetes mellitus without mention of complication, not stated as uncontrolled   . Depressive disorder, not elsewhere classified   . Unspecified essential hypertension   . Obstructive chronic bronchitis with exacerbation   . Anxiety state, unspecified   . Esophageal reflux   . Other and unspecified hyperlipidemia   . Pressure ulcer, other site(707.09)   . Dementia   . Chronic kidney disease 08/04/2011    Acute renal failure per note 08/04/2011-Dr. Art Chilton Si  . Stroke     Past Surgical History  Procedure Laterality Date  . Cystoscopy w/ ureteral stent placement  08/07/2011    Procedure: CYSTOSCOPY WITH RETROGRADE PYELOGRAM/URETERAL STENT PLACEMENT;  Surgeon: Kathi Ludwig, MD;  Location: [facility];  Service: Urology;  Laterality: Bilateral;  bilateral stent placement ureterosopy   . Cystoscopy/retrograde/ureteroscopy  12/07/2011    Procedure: CYSTOSCOPY/RETROGRADE/URETEROSCOPY;  Surgeon: Kathi Ludwig, MD;  Location: [facility];  Service: Urology;  Laterality: Bilateral;  . Cystoscopy w/ ureteral stent placement  12/07/2011    Procedure: CYSTOSCOPY WITH STENT REPLACEMENT;  Surgeon: Kathi Ludwig, MD;  Location: [facility];  Service: Urology;  Laterality: Bilateral;  (BIL) JJ STENT EXCHANGE  . Cystoscopy with urethral dilatation  12/07/2011    Procedure: CYSTOSCOPY WITH URETHRAL DILATATION;  Surgeon: Kathi Ludwig, MD;  Location: [facility];  Service: Urology;;    No family history on file.  History  Substance Use Topics  . Smoking status: Never Smoker   . Smokeless tobacco: Not on file  . Alcohol Use: No      Review of Systems  Unable to perform ROS: Dementia    Allergies  Review of patient's allergies indicates no known allergies.  Home Medications   Current Outpatient Rx  Name  Route  Sig  Dispense  Refill  . albuterol (PROVENTIL) (2.5 MG/3ML) 0.083% nebulizer solution   Nebulization   Take 2.5 mg by nebulization every 2 (two) hours as needed for wheezing.         Marland Kitchen aspirin EC 81 MG tablet   Oral   Take 81 mg by mouth daily.         . citalopram (CELEXA) 20 MG tablet   Oral   Take 20 mg by mouth at bedtime.          . ferrous sulfate 325 (65 FE)  MG tablet   Oral   Take 325 mg by mouth daily with breakfast.          . Fluticasone-Salmeterol (ADVAIR) 250-50 MCG/DOSE AEPB   Inhalation   Inhale 1 puff into the lungs 2 (two) times daily.          Marland Kitchen guaiFENesin (MUCINEX) 600 MG 12 hr tablet   Oral   Take 600 mg by mouth 2 (two) times daily.          . insulin aspart (NOVOLOG) 100 UNIT/ML injection   Subcutaneous   Inject 5 Units into the skin 4 (four) times daily -  before meals and at bedtime. Give for CBG > 150. Before meals and at bedtime         . insulin glargine (LANTUS) 100 UNIT/ML injection   Subcutaneous    Inject 5 Units into the skin at bedtime.          Marland Kitchen ipratropium-albuterol (DUONEB) 0.5-2.5 (3) MG/3ML SOLN   Nebulization   Take 3 mLs by nebulization every 6 (six) hours as needed. For shortness of breath         . LORazepam (ATIVAN) 0.5 MG tablet   Oral   Take 0.5 mg by mouth daily as needed for anxiety.         . Multiple Vitamins-Minerals (MULTIVITAMINS THER. W/MINERALS) TABS   Oral   Take 1 tablet by mouth daily.          Marland Kitchen oxyCODONE (OXY IR/ROXICODONE) 5 MG immediate release tablet   Oral   Take 2 tablets (10 mg total) by mouth every 4 (four) hours as needed. For pain   30 tablet   0   . polyethylene glycol (MIRALAX / GLYCOLAX) packet   Oral   Take 17 g by mouth daily.         . ranitidine (ZANTAC) 75 MG tablet   Oral   Take 75 mg by mouth daily as needed for heartburn.          . simvastatin (ZOCOR) 40 MG tablet   Oral   Take 40 mg by mouth at bedtime.          . thiamine 100 MG tablet   Oral   Take 100 mg by mouth every morning.          Marland Kitchen imipenem-cilastatin (PRIMAXIN IV) 500 MG injection   Intravenous   Inject 500 mg into the vein every 6 (six) hours.           BP 118/74  Pulse 77  Temp(Src) 98 F (36.7 C) (Oral)  Resp 18  SpO2 100%  Physical Exam  Nursing note and vitals reviewed. Constitutional: He is oriented to person, place, and time. He appears well-developed and well-nourished. No distress.  HENT:  Head: Normocephalic and atraumatic.  Right Ear: External ear normal.  Left Ear: External ear normal.  Nose: Nose normal.  Eyes: Conjunctivae are normal.  Neck: Normal range of motion. No tracheal deviation present.  Cardiovascular: Normal rate, regular rhythm and normal heart sounds.   Pulmonary/Chest: Effort normal and breath sounds normal. No stridor.  Abdominal: Soft. He exhibits no distension. There is no tenderness.  Musculoskeletal: Normal range of motion.  Neurological: He is alert and oriented to person, place, and  time.  Skin: Skin is warm and dry. He is not diaphoretic.  Swelling and erythema surrounding PICC line in right arm - tender to palpation   Psychiatric: He has a normal mood and affect.  His behavior is normal. His speech is slurred.  Patient pleasantly confused     ED Course  Procedures (including critical care time)  Labs Reviewed  CBC WITH DIFFERENTIAL - Abnormal; Notable for the following:    WBC 20.5 (*)    RBC 4.03 (*)    Hemoglobin 10.9 (*)    HCT 34.3 (*)    Neutrophils Relative 85 (*)    Neutro Abs 17.5 (*)    Lymphocytes Relative 7 (*)    Monocytes Absolute 1.3 (*)    All other components within normal limits  COMPREHENSIVE METABOLIC PANEL - Abnormal; Notable for the following:    Sodium 131 (*)    Glucose, Bld 135 (*)    BUN 30 (*)    Creatinine, Ser 1.93 (*)    Albumin 3.1 (*)    GFR calc non Af Amer 33 (*)    GFR calc Af Amer 38 (*)    All other components within normal limits  URINALYSIS, ROUTINE W REFLEX MICROSCOPIC - Abnormal; Notable for the following:    APPearance TURBID (*)    Hgb urine dipstick LARGE (*)    Protein, ur 100 (*)    Leukocytes, UA LARGE (*)    All other components within normal limits  URINE MICROSCOPIC-ADD ON - Abnormal; Notable for the following:    Squamous Epithelial / LPF FEW (*)    Bacteria, UA MANY (*)    All other components within normal limits  URINE CULTURE   Dg Chest 2 View  08/17/2012  *RADIOLOGY REPORT*  Clinical Data: Nausea.  CHEST - 2 VIEW  Comparison: None.  Findings: Heart size is normal.  The lungs are clear.  Mild rightward curvature is evident in the thoracic spine.  Degenerative endplate changes are noted.  Degenerative changes are present at the shoulders bilaterally.  IMPRESSION: No acute cardiopulmonary disease.   Original Report Authenticated By: Marin Roberts, M.D.    Reviewed labs from [facility]. Urine culture from 07/29/12 shows bacteria sensitive to imipenem. Receiving imipenem through PICC line.  Today ua shows continue UTI. Plan to admit due to leukocytosis, PICC line infection, and resistant UTI. Plan to remove PICC line. This case was discussed with Dr. Adriana Simas.    1. CKD (chronic kidney disease) stage 3, GFR 30-59 ml/min   2. Dementia   3. UTI (lower urinary tract infection)   4. PICC line infection, initial encounter       MDM  Patient is a pleasantly confused 72 year old male who presents today from the nursing home with PICC line infection and persistent UTI after treatment with imipenem. Discussed case with Dr. Adriana Simas. Plan to admit to hospitalist service due to leukocytosis, PICC line infection, and resistant UTI. Hospitalist service agrees to admission.     Medications  sodium chloride 0.9 % bolus 1,000 mL (0 mLs Intravenous Stopped 08/17/12 1132)  ondansetron (ZOFRAN) injection 4 mg (4 mg Intravenous Given 08/17/12 1032)       Mora Bellman, PA-C 08/17/12 1600

## 2012-08-17 NOTE — Progress Notes (Addendum)
Pt arrived on the unit at 1715. Rt. Sided weakness noted and unable to move his feet. Rt. Arm is very sensitive to touch. Pt has hx of dementia. Swab from nares came back positive for mersa, pt on precautions. He has said only a couple words. Sleeping. Repositioned to Rt. Side. Barrier cream applied to scrotum, which is redden. Condom cath applied. CBG-120, no coverage needed. Pt has several abrasions on both of his shins, they are dry and scabbed over.

## 2012-08-17 NOTE — ED Notes (Signed)
Bed:WA09<BR> Expected date:<BR> Expected time:<BR> Means of arrival:<BR> Comments:<BR>

## 2012-08-17 NOTE — Progress Notes (Signed)
ANTIBIOTIC CONSULT NOTE - INITIAL  Pharmacy Consult for Vancomycin and Zosyn Indication: PICC infection  No Known Allergies  Patient Measurements: Weight: 173 lb (78.472 kg)  Vital Signs: Temp: 99.3 F (37.4 C) (05/11 1705) Temp src: Oral (05/11 1705) BP: 144/66 mmHg (05/11 1705) Pulse Rate: 96 (05/11 1705) Intake/Output from previous day:   Intake/Output from this shift:    Labs:  Recent Labs  08/17/12 1000  WBC 20.5*  HGB 10.9*  PLT 165  CREATININE 1.93*   CrCl 34 ml/min/1.15m2 (normalized)  Microbiology: No results found for this or any previous visit (from the past 720 hour(s)).  Medical History: Past Medical History  Diagnosis Date  . 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 Acute conjunctivitis, unspecified   . Aortic aneurysm of unspecified site without mention of rupture   . Flaccid hemiplegia affecting dominant side   . Type II or unspecified type diabetes mellitus without mention of complication, not stated as uncontrolled   . Depressive disorder, not elsewhere classified   . Unspecified essential hypertension   . Obstructive chronic bronchitis with exacerbation   . Anxiety state, unspecified   . Esophageal reflux   . Other and unspecified hyperlipidemia   . Pressure ulcer, other site(707.09)   . Dementia   . Chronic kidney disease 08/04/2011    Acute renal failure per note 08/04/2011-Dr. Art Chilton Si  . Stroke     Medications:  Scheduled:  . aspirin EC  81 mg Oral Daily  . citalopram  20 mg Oral QHS  . enoxaparin (LOVENOX) injection  40 mg Subcutaneous Q24H  . [START ON 08/18/2012] famotidine  10 mg Oral Daily  . [START ON 08/18/2012] ferrous sulfate  325 mg Oral Q breakfast  . guaiFENesin  600 mg Oral BID  . [START ON 08/18/2012] insulin aspart  0-15 Units Subcutaneous TID WC  . [START ON 08/18/2012] insulin aspart  4 Units Subcutaneous TID WC  . insulin glargine  5 Units Subcutaneous QHS  .  mometasone-formoterol  2 puff Inhalation BID  . [START ON 08/18/2012] multivitamin with minerals  1 tablet Oral Daily  . [COMPLETED] ondansetron (ZOFRAN) IV  4 mg Intravenous Once  . [START ON 08/18/2012] polyethylene glycol  17 g Oral Daily  . simvastatin  40 mg Oral QHS  . [COMPLETED] sodium chloride  1,000 mL Intravenous Once  . sodium chloride  3 mL Intravenous Q12H  . [START ON 08/18/2012] thiamine  100 mg Oral q morning - 10a   Assessment: 72 yo male admitted from nursing home with presumed PICC infection. Recently completed 10 days therapy with Primaxin for multi-drug resistant UTI. To have PICC removed and begin broad spectrum antibiotics with Vanc/Zosyn for cellulitis/bacteremia, not intending to treat UTI as MD suspects chronic colonization.  CKD stage III, current CrCl ~35 ml/min  WBC elevated  Febrile currently 99.3  Urine and blood cultures ordered  Goal of Therapy:  Vancomycin trough level 15-20 mcg/ml Zosyn per renal function  Plan:   Vancomycin 1g IV q24h Check trough at steady state Zosyn 3.375gm IV q8h (4hr extended infusions) Follow up renal function & cultures  Loralee Pacas, PharmD, BCPS Pager: [phone] 08/17/2012,5:23 PM

## 2012-08-17 NOTE — ED Notes (Addendum)
Pt is from [facility] and this am began having low fever, n/v they noticed his picc line red and warm to touch, pt is receiving antibiotics via picc line for an uti. Pt alert x4 at this time. cbg 130

## 2012-08-17 NOTE — H&P (Addendum)
[facility]          History and Physical    PCP:   GREEN, Lenon Curt, MD   Chief Complaint:  Diaphoresis, nausea, swelling at site of PICC line  HPI: Patient is a 72 year old white man with past medical history significant for chronic kidney disease stage III, history of urinary retention status post double-J stent placement, insulin-dependent diabetes, dementia who presents to the hospital today from skilled nursing facility with the above-mentioned complaints. Unfortunately patient is a very difficult historian given his dementia. I have called the nurse in charge of him at the nursing facility for his history. It appears that about 2 weeks ago on routine culture patient was noted to have a UTI. Upon sensitivities he was only sensitive to IV antibiotics so a PICC line was placed and he was given Primaxin for a total of 10 days which he has completed 3 days ago. Today they noted that he was diaphoretic, he complained to them of nausea although he did not actually have emesis, and upon evaluation of his PICC site it was found to be edematous and erythematous. Hence he was sent to the hospital where we have been asked to admit him for further evaluation and management. Of note he does have a white count of 20.5. He is afebrile.  Allergies:  No Known Allergies    Past Medical History  Diagnosis Date  . 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 Acute conjunctivitis, unspecified   . Aortic aneurysm of unspecified site without mention of rupture   . Flaccid hemiplegia affecting dominant side   . Type II or unspecified type diabetes mellitus without mention of complication, not stated as uncontrolled   . Depressive disorder, not elsewhere classified   . Unspecified essential hypertension   . Obstructive chronic bronchitis with exacerbation   . Anxiety state, unspecified   . Esophageal reflux   . Other and unspecified hyperlipidemia   .  Pressure ulcer, other site(707.09)   . Dementia   . Chronic kidney disease 08/04/2011    Acute renal failure per note 08/04/2011-Dr. Art Chilton Si  . Stroke     Past Surgical History  Procedure Laterality Date  . Cystoscopy w/ ureteral stent placement  08/07/2011    Procedure: CYSTOSCOPY WITH RETROGRADE PYELOGRAM/URETERAL STENT PLACEMENT;  Surgeon: Kathi Ludwig, MD;  Location: [facility];  Service: Urology;  Laterality: Bilateral;  bilateral stent placement ureterosopy  . Cystoscopy/retrograde/ureteroscopy  12/07/2011    Procedure: CYSTOSCOPY/RETROGRADE/URETEROSCOPY;  Surgeon: Kathi Ludwig, MD;  Location: [facility];  Service: Urology;  Laterality: Bilateral;  . Cystoscopy w/ ureteral stent placement  12/07/2011    Procedure: CYSTOSCOPY WITH STENT REPLACEMENT;  Surgeon: Kathi Ludwig, MD;  Location: [facility];  Service: Urology;  Laterality: Bilateral;  (BIL) JJ STENT EXCHANGE  . Cystoscopy with urethral dilatation  12/07/2011    Procedure: CYSTOSCOPY WITH URETHRAL DILATATION;  Surgeon: Kathi Ludwig, MD;  Location: [facility];  Service: Urology;;    Prior to Admission medications   Medication Sig Start Date End Date Taking? Authorizing Provider  albuterol (PROVENTIL) (2.5 MG/3ML) 0.083% nebulizer solution Take 2.5 mg by nebulization every 2 (two) hours as needed for wheezing.   Yes Historical Provider, MD  aspirin EC 81 MG tablet Take 81 mg by mouth daily.   Yes Historical Provider, MD  citalopram (CELEXA) 20 MG tablet Take 20 mg by mouth at bedtime.    Yes Historical Provider, MD  ferrous sulfate 325 (65  FE) MG tablet Take 325 mg by mouth daily with breakfast.    Yes Historical Provider, MD  Fluticasone-Salmeterol (ADVAIR) 250-50 MCG/DOSE AEPB Inhale 1 puff into the lungs 2 (two) times daily.    Yes Historical Provider, MD  guaiFENesin (MUCINEX) 600 MG 12 hr tablet Take 600 mg by mouth 2 (two) times daily.    Yes Historical Provider, MD  insulin aspart (NOVOLOG) 100 UNIT/ML  injection Inject 5 Units into the skin 4 (four) times daily -  before meals and at bedtime. Give for CBG > 150. Before meals and at bedtime   Yes Historical Provider, MD  insulin glargine (LANTUS) 100 UNIT/ML injection Inject 5 Units into the skin at bedtime.    Yes Historical Provider, MD  ipratropium-albuterol (DUONEB) 0.5-2.5 (3) MG/3ML SOLN Take 3 mLs by nebulization every 6 (six) hours as needed. For shortness of breath   Yes Historical Provider, MD  LORazepam (ATIVAN) 0.5 MG tablet Take 0.5 mg by mouth daily as needed for anxiety.   Yes Historical Provider, MD  Multiple Vitamins-Minerals (MULTIVITAMINS THER. W/MINERALS) TABS Take 1 tablet by mouth daily.    Yes Historical Provider, MD  oxyCODONE (OXY IR/ROXICODONE) 5 MG immediate release tablet Take 2 tablets (10 mg total) by mouth every 4 (four) hours as needed. For pain 08/12/11  Yes Altha Harm, MD  polyethylene glycol (MIRALAX / GLYCOLAX) packet Take 17 g by mouth daily.   Yes Historical Provider, MD  ranitidine (ZANTAC) 75 MG tablet Take 75 mg by mouth daily as needed for heartburn.    Yes Historical Provider, MD  simvastatin (ZOCOR) 40 MG tablet Take 40 mg by mouth at bedtime.    Yes Historical Provider, MD  thiamine 100 MG tablet Take 100 mg by mouth every morning.    Yes Historical Provider, MD  imipenem-cilastatin (PRIMAXIN IV) 500 MG injection Inject 500 mg into the vein every 6 (six) hours.    Historical Provider, MD    Social History:  reports that he has never smoked. He does not have any smokeless tobacco history on file. He reports that he does not drink alcohol or use illicit drugs.  No family history on file.  Review of Systems:  Unable to obtain given dementia to  Physical Exam: Blood pressure 142/69, pulse 76, temperature 98 F (36.7 C), temperature source Oral, resp. rate 16, SpO2 100.00%. Gen., awake, can answer simple questions, does not appear to be in distress. HEENT: Normocephalic, atraumatic, pupils  equal and reactive to light, extraocular movements intact, moist mucous membranes. Neck: Supple, no JVD, no lymphadenopathy, no bruits, no goiter. Cardiovascular: Regular rate and rhythm, no murmurs, rubs or gallops. Lungs: Clear to auscultation bilaterally. Abdomen: Soft, nontender, nondistended, positive bowel sounds, no masses or organomegaly noted. Extremities: No clubbing, cyanosis or edema, positive pedal pulses. At the site of his PICC line in his right upper arm there is erythema and significant edema. Neurologic: Nonfocal. He moves all 4 spontaneously.  Labs on Admission:  Results for orders placed during the hospital encounter of 08/17/12 (from the past 48 hour(s))  CBC WITH DIFFERENTIAL     Status: Abnormal   Collection Time    08/17/12 10:00 AM      Result Value Range   WBC 20.5 (*) 4.0 - 10.5 K/uL   RBC 4.03 (*) 4.22 - 5.81 MIL/uL   Hemoglobin 10.9 (*) 13.0 - 17.0 g/dL   HCT 16.1 (*) 09.6 - 04.5 %   MCV 85.1  78.0 - 100.0 fL  MCH 27.0  26.0 - 34.0 pg   MCHC 31.8  30.0 - 36.0 g/dL   RDW 78.2  95.6 - 21.3 %   Platelets 165  150 - 400 K/uL   Neutrophils Relative 85 (*) 43 - 77 %   Neutro Abs 17.5 (*) 1.7 - 7.7 K/uL   Lymphocytes Relative 7 (*) 12 - 46 %   Lymphs Abs 1.3  0.7 - 4.0 K/uL   Monocytes Relative 6  3 - 12 %   Monocytes Absolute 1.3 (*) 0.1 - 1.0 K/uL   Eosinophils Relative 1  0 - 5 %   Eosinophils Absolute 0.3  0.0 - 0.7 K/uL   Basophils Relative 0  0 - 1 %   Basophils Absolute 0.1  0.0 - 0.1 K/uL  COMPREHENSIVE METABOLIC PANEL     Status: Abnormal   Collection Time    08/17/12 10:00 AM      Result Value Range   Sodium 131 (*) 135 - 145 mEq/L   Potassium 4.5  3.5 - 5.1 mEq/L   Chloride 97  96 - 112 mEq/L   CO2 25  19 - 32 mEq/L   Glucose, Bld 135 (*) 70 - 99 mg/dL   BUN 30 (*) 6 - 23 mg/dL   Creatinine, Ser 0.86 (*) 0.50 - 1.35 mg/dL   Calcium 9.8  8.4 - 57.8 mg/dL   Total Protein 7.1  6.0 - 8.3 g/dL   Albumin 3.1 (*) 3.5 - 5.2 g/dL   AST 11  0 -  37 U/L   ALT 7  0 - 53 U/L   Alkaline Phosphatase 76  39 - 117 U/L   Total Bilirubin 0.5  0.3 - 1.2 mg/dL   GFR calc non Af Amer 33 (*) >90 mL/min   GFR calc Af Amer 38 (*) >90 mL/min   Comment:            The eGFR has been calculated     using the CKD EPI equation.     This calculation has not been     validated in all clinical     situations.     eGFR's persistently     <90 mL/min signify     possible Chronic Kidney Disease.  URINALYSIS, ROUTINE W REFLEX MICROSCOPIC     Status: Abnormal   Collection Time    08/17/12 11:50 AM      Result Value Range   Color, Urine YELLOW  YELLOW   APPearance TURBID (*) CLEAR   Specific Gravity, Urine 1.017  1.005 - 1.030   pH 6.0  5.0 - 8.0   Glucose, UA NEGATIVE  NEGATIVE mg/dL   Hgb urine dipstick LARGE (*) NEGATIVE   Bilirubin Urine NEGATIVE  NEGATIVE   Ketones, ur NEGATIVE  NEGATIVE mg/dL   Protein, ur 469 (*) NEGATIVE mg/dL   Urobilinogen, UA 0.2  0.0 - 1.0 mg/dL   Nitrite NEGATIVE  NEGATIVE   Leukocytes, UA LARGE (*) NEGATIVE  URINE MICROSCOPIC-ADD ON     Status: Abnormal   Collection Time    08/17/12 11:50 AM      Result Value Range   Squamous Epithelial / LPF FEW (*) RARE   WBC, UA TOO NUMEROUS TO COUNT  <3 WBC/hpf   RBC / HPF TOO NUMEROUS TO COUNT  <3 RBC/hpf   Bacteria, UA MANY (*) RARE    Radiological Exams on Admission: Dg Chest 2 View  08/17/2012  *RADIOLOGY REPORT*  Clinical Data: Nausea.  CHEST - 2  VIEW  Comparison: None.  Findings: Heart size is normal.  The lungs are clear.  Mild rightward curvature is evident in the thoracic spine.  Degenerative endplate changes are noted.  Degenerative changes are present at the shoulders bilaterally.  IMPRESSION: No acute cardiopulmonary disease.   Original Report Authenticated By: Marin Roberts, M.D.     Assessment/Plan Principal Problem:   PICC line infection Active Problems:   Dementia   Leukocytosis   CKD (chronic kidney disease) stage 3, GFR 30-59 ml/min   UTI  (lower urinary tract infection)    Probable PICC line infection -Site does appear inflamed. - Will place order for removal of PICC line as well as blood cultures to be done prior to initiation of antibiotics. -If he is indeed bacteremic, may need a 2-D echo for further evaluation. -Vancomycin and Zosyn have been ordered.  Questionable UTI -He does have indwelling double-J stents that were placed in August of 2013. -I have checked in the computer as far back as April 2012 and he has never had a normal urinalysis or a negative culture. -I suspect he may be colonized and as such does not need to be treated for any further UTIs in my opinion.  Chronic kidney disease stage III  -Creatinine is at his baseline of 1.9.  Leukocytosis -Likely secondary to PICC line infection. -Follow trend with antibiotics.  DVT prophylaxis -Lovenox.  Time Spent on Admission: 85 minutes  HERNANDEZ ACOSTA,ESTELA [facility] Pager: [phone] 08/17/2012, 2:54 PM

## 2012-08-18 ENCOUNTER — Encounter (HOSPITAL_COMMUNITY): Payer: Self-pay | Admitting: *Deleted

## 2012-08-18 DIAGNOSIS — F329 Major depressive disorder, single episode, unspecified: Secondary | ICD-10-CM | POA: Diagnosis present

## 2012-08-18 DIAGNOSIS — A419 Sepsis, unspecified organism: Secondary | ICD-10-CM | POA: Diagnosis present

## 2012-08-18 DIAGNOSIS — E785 Hyperlipidemia, unspecified: Secondary | ICD-10-CM | POA: Diagnosis present

## 2012-08-18 DIAGNOSIS — J449 Chronic obstructive pulmonary disease, unspecified: Secondary | ICD-10-CM | POA: Diagnosis present

## 2012-08-18 DIAGNOSIS — D649 Anemia, unspecified: Secondary | ICD-10-CM | POA: Diagnosis present

## 2012-08-18 LAB — BASIC METABOLIC PANEL
CO2: 28 mEq/L (ref 19–32)
Calcium: 9.5 mg/dL (ref 8.4–10.5)
Creatinine, Ser: 2.22 mg/dL — ABNORMAL HIGH (ref 0.50–1.35)
GFR calc non Af Amer: 27 mL/min — ABNORMAL LOW (ref >90)
Glucose, Bld: 137 mg/dL — ABNORMAL HIGH (ref 70–99)

## 2012-08-18 LAB — GLUCOSE, CAPILLARY
Glucose-Capillary: 143 mg/dL — ABNORMAL HIGH (ref 70–99)
Glucose-Capillary: 86 mg/dL (ref 70–99)
Glucose-Capillary: 149 mg/dL — ABNORMAL HIGH (ref 70–99)

## 2012-08-18 LAB — CBC
MCH: 27.1 pg (ref 26.0–34.0)
MCV: 86.2 fL (ref 78.0–100.0)
Platelets: 136 10*3/uL — ABNORMAL LOW (ref 150–400)
RDW: 15 % (ref 11.5–15.5)

## 2012-08-18 MED ORDER — GLUCERNA SHAKE PO LIQD
237.0000 mL | Freq: Three times a day (TID) | ORAL | Status: DC
  Administered 2012-08-18 – 2012-08-20 (×6): 237 mL via ORAL
  Filled 2012-08-18 (×8): qty 237

## 2012-08-18 NOTE — Progress Notes (Signed)
IV nurse removed picc line,states pus and foul smell noted from entry site. Area reddened. Tip of catheter sent to lab.Pt remaining flat for at least 30 minutes.

## 2012-08-18 NOTE — Progress Notes (Signed)
[facility] PROGRESS NOTE  Justin Sherman ZOX:[id number] DOB: 21-Mar-1941 DOA: 08/17/2012 PCP: Kimber Relic, MD  Brief narrative: Justin Sherman is an 72 y.o. male with a  PMH of stage III chronic kidney disease, history of urinary retention status post double J stent placement, insulin-dependent diabetes, dementia, and urinary tract infection requiring a prolonged course of IV Primaxin for which a PICC line was placed, who was admitted to the hospital from his SNF on 08/17/2012 with what appears to be a PICC line infection.  Assessment/Plan: Principal Problem:   Sepsis secondary to PICC line infection -Site inflamed and suspicious for infection. Remove PICC line and send catheter tip for culture. -Followup blood cultures. -Continue empiric vancomycin and Zosyn. -PICC line was initially placed for ESBL bacteremia. Active Problems:   Normocytic anemia of chronic kidney disease -Hemoglobin stable, monitor. No current indications for transfusion.   Hyperlipidemia -Continue statin therapy.   Diabetes -Currently receiving Lantus 5 units each bedtime and moderate scale SSI. -CBGs 113-174.   COPD -Stable on bronchodilator treatments as needed and Advair.   Depression -Continue Celexa.   Dementia -Return to skilled nursing facility when stable.   Leukocytosis -White blood cell count trending down on antibiotics.   CKD (chronic kidney disease) stage 3, GFR 30-59 ml/min -Baseline creatinine 1.9. Monitor closely.   UTI (lower urinary tract infection) -Indwelling double-J stent placed August 2013. -Persistently abnormal urinalysis in the face of negative culture results noted. -Followup urine cultures.  Code Status: Full. Family Communication: No family at the bedside. Disposition Plan: Back to SNF when stable.   Medical Consultants:  None.  Other Consultants:  None.  Anti-infectives:  Vancomycin 08/17/2012--->  Zosyn 08/17/2012--->  HPI/Subjective: Justin Sherman is  awake and alert. He is answering questions appropriately. Spiked a fever to 102.8 last night. He is complaining of some right arm discomfort but no other pain, no dyspnea, no cough, no nausea, no vomiting or diarrhea. He states his appetite is good and that his bowels have been moving normally.  Objective: Filed Vitals:   08/17/12 2100 08/18/12 0029 08/18/12 0500 08/18/12 0816  BP: 127/59  141/73   Pulse: 107  77   Temp: 102.8 F (39.3 C) 99.2 F (37.3 C) 97.3 F (36.3 C)   TempSrc: Oral Oral Oral   Resp: 21  20   Weight:      SpO2: 98%  100% 98%    Intake/Output Summary (Last 24 hours) at 08/18/12 1030 Last data filed at 08/18/12 0600  Gross per 24 hour  Intake    540 ml  Output    300 ml  Net    240 ml    Exam: Gen:  NAD Cardiovascular:  RRR, No M/R/G Respiratory:  Lungs CTAB Gastrointestinal:  Abdomen soft, NT/ND, + BS Extremities:  Right upper extremity inflamed, erythematous, swollen and tender.  Data Reviewed: Basic Metabolic Panel:  Recent Labs Lab 08/17/12 1000 08/18/12 0140  NA 131* 135  K 4.5 4.3  CL 97 99  CO2 25 28  GLUCOSE 135* 137*  BUN 30* 29*  CREATININE 1.93* 2.22*  CALCIUM 9.8 9.5   GFR The CrCl is unknown because both a height and weight (above a minimum accepted value) are required for this calculation. Liver Function Tests:  Recent Labs Lab 08/17/12 1000  AST 11  ALT 7  ALKPHOS 76  BILITOT 0.5  PROT 7.1  ALBUMIN 3.1*   CBC:  Recent Labs Lab 08/17/12 1000 08/18/12 0140  WBC 20.5* 12.7*  NEUTROABS 17.5*  --  HGB 10.9* 10.6*  HCT 34.3* 33.7*  MCV 85.1 86.2  PLT 165 136*   CBG:  Recent Labs Lab 08/17/12 1812 08/17/12 2204 08/18/12 0802  GLUCAP 120* 174* 113*   Hgb A1c  Recent Labs  08/17/12 1000  HGBA1C 6.1*   Microbiology Recent Results (from the past 240 hour(s))  MRSA PCR SCREENING     Status: Abnormal   Collection Time    08/17/12  5:32 PM      Result Value Range Status   MRSA by PCR POSITIVE (*)  NEGATIVE Final   Comment:            The GeneXpert MRSA Assay (FDA     approved for NASAL specimens     only), is one component of a     comprehensive MRSA colonization     surveillance program. It is not     intended to diagnose MRSA     infection nor to guide or     monitor treatment for     MRSA infections.     RESULT CALLED TO, READ BACK BY AND VERIFIED WITH:     Ledora Bottcher [id number] @ 2014 BY J SCOTTON     Procedures and Diagnostic Studies: Dg Chest 2 View  08/17/2012  *RADIOLOGY REPORT*  Clinical Data: Nausea.  CHEST - 2 VIEW  Comparison: None.  Findings: Heart size is normal.  The lungs are clear.  Mild rightward curvature is evident in the thoracic spine.  Degenerative endplate changes are noted.  Degenerative changes are present at the shoulders bilaterally.  IMPRESSION: No acute cardiopulmonary disease.   Original Report Authenticated By: Marin Roberts, M.D.     Scheduled Meds: . aspirin EC  81 mg Oral Daily  . Chlorhexidine Gluconate Cloth  6 each Topical Q0600  . citalopram  20 mg Oral QHS  . enoxaparin (LOVENOX) injection  40 mg Subcutaneous Q24H  . famotidine  10 mg Oral Daily  . ferrous sulfate  325 mg Oral Q breakfast  . guaiFENesin  600 mg Oral BID  . insulin aspart  0-15 Units Subcutaneous TID WC  . insulin aspart  4 Units Subcutaneous TID WC  . insulin glargine  5 Units Subcutaneous QHS  . mometasone-formoterol  2 puff Inhalation BID  . multivitamin with minerals  1 tablet Oral Daily  . mupirocin ointment  1 application Nasal BID  . piperacillin-tazobactam (ZOSYN)  IV  3.375 g Intravenous Q8H  . polyethylene glycol  17 g Oral Daily  . simvastatin  40 mg Oral QHS  . sodium chloride  3 mL Intravenous Q12H  . thiamine  100 mg Oral q morning - 10a  . vancomycin  1,000 mg Intravenous Q24H   Continuous Infusions:   Time spent: 35 minutes.   LOS: 1 day   Araya Roel  [facility] Pager [phone].  If 8PM-8AM, please contact  night-coverage at [email or web address], password [facility] 08/18/2012, 10:30 AM

## 2012-08-18 NOTE — Progress Notes (Signed)
Today pt is trying to speak more, his speech is garbled. He is also able to move his Lt foot,which he was unable to do yesterday. Follows commands.

## 2012-08-18 NOTE — Progress Notes (Signed)
CRITICAL VALUE ALERT  Critical value received:  MRSA PCR +  Date of notification:  08/17/12   Time of notification:  2015  Critical value read back: yes  Nurse who received alert:  Cynda Familia, RN  MD notified (1st page):  R. Riedler, PA  Time of first page:  2035  MD notified (2nd page):   Time of second page:  Responding MD:  Boyce Medici, PA  Time MD responded:  2050  Standing orders for Positive MRSA PCR initiated.

## 2012-08-18 NOTE — Progress Notes (Signed)
INITIAL NUTRITION ASSESSMENT  DOCUMENTATION CODES Per approved criteria  -Not Applicable   INTERVENTION: - Glucerna shakes TID - Will continue to monitor   NUTRITION DIAGNOSIS: Inadequate oral intake related to poor appetite as evidenced by 25% meal intake.   Goal: Pt to consume >90% of meals/supplements  Monitor:  Weights, labs, intake  Reason for Assessment: Nutrition risk, low braden  72 y.o. male  Admitting Dx: PICC line infection  ASSESSMENT: Pt from SNF for PICC line infection. Pt with dementia, unable to provide history. Discussed with nursing home staff who report pt typically has a gone appetite and eats 100% of meals, not only any nutritional supplements. Report weight has been relatively stable. Report pt was working with speech therapy for problems with speech, not swallowing. Report pt typically does not eat well when he is sick. During visit with pt he had not consumed most of his tray.   Height: Ht Readings from Last 1 Encounters:  11/29/11 5\' 10"  (1.778 m)    Weight: Wt Readings from Last 1 Encounters:  08/17/12 173 lb (78.472 kg)    Ideal Body Weight: 166 lb  % Ideal Body Weight: 104  Wt Readings from Last 10 Encounters:  08/17/12 173 lb (78.472 kg)  11/29/11 185 lb (83.915 kg)  11/29/11 185 lb (83.915 kg)  08/09/11 185 lb 6.5 oz (84.1 kg)  08/09/11 185 lb 6.5 oz (84.1 kg)    Usual Body Weight: 177 lb per nursing facility   % Usual Body Weight: 97  BMI:  Body mass index is 24.82 kg/(m^2).  Estimated Nutritional Needs: Kcal: 1950-2350 Protein: 80-95g Fluid: 1.9-2.3L/day  Skin: Intact   Diet Order: Carb Control  EDUCATION NEEDS: -No education needs identified at this time   Intake/Output Summary (Last 24 hours) at 08/18/12 1418 Last data filed at 08/18/12 1300  Gross per 24 hour  Intake   1913 ml  Output    700 ml  Net   1213 ml    Last BM: PTA  Labs:   Recent Labs Lab 08/17/12 1000 08/18/12 0140  NA 131* 135  K 4.5  4.3  CL 97 99  CO2 25 28  BUN 30* 29*  CREATININE 1.93* 2.22*  CALCIUM 9.8 9.5  GLUCOSE 135* 137*    CBG (last 3)   Recent Labs  08/17/12 2204 08/18/12 0802 08/18/12 1201  GLUCAP 174* 113* 143*    Scheduled Meds: . aspirin EC  81 mg Oral Daily  . Chlorhexidine Gluconate Cloth  6 each Topical Q0600  . citalopram  20 mg Oral QHS  . enoxaparin (LOVENOX) injection  40 mg Subcutaneous Q24H  . famotidine  10 mg Oral Daily  . ferrous sulfate  325 mg Oral Q breakfast  . guaiFENesin  600 mg Oral BID  . insulin aspart  0-15 Units Subcutaneous TID WC  . insulin aspart  4 Units Subcutaneous TID WC  . insulin glargine  5 Units Subcutaneous QHS  . mometasone-formoterol  2 puff Inhalation BID  . multivitamin with minerals  1 tablet Oral Daily  . mupirocin ointment  1 application Nasal BID  . piperacillin-tazobactam (ZOSYN)  IV  3.375 g Intravenous Q8H  . polyethylene glycol  17 g Oral Daily  . simvastatin  40 mg Oral QHS  . sodium chloride  3 mL Intravenous Q12H  . thiamine  100 mg Oral q morning - 10a  . vancomycin  1,000 mg Intravenous Q24H    Continuous Infusions:   Past Medical History  Diagnosis  Date  . 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 Acute conjunctivitis, unspecified   . Aortic aneurysm of unspecified site without mention of rupture   . Flaccid hemiplegia affecting dominant side   . Type II or unspecified type diabetes mellitus without mention of complication, not stated as uncontrolled   . Depressive disorder, not elsewhere classified   . Unspecified essential hypertension   . Obstructive chronic bronchitis with exacerbation   . Anxiety state, unspecified   . Esophageal reflux   . Other and unspecified hyperlipidemia   . Pressure ulcer, other site(707.09)   . Dementia   . Chronic kidney disease 08/04/2011    Acute renal failure per note 08/04/2011-Dr. Art Chilton Si  . Stroke     Past Surgical History  Procedure  Laterality Date  . Cystoscopy w/ ureteral stent placement  08/07/2011    Procedure: CYSTOSCOPY WITH RETROGRADE PYELOGRAM/URETERAL STENT PLACEMENT;  Surgeon: Kathi Ludwig, MD;  Location: [facility];  Service: Urology;  Laterality: Bilateral;  bilateral stent placement ureterosopy  . Cystoscopy/retrograde/ureteroscopy  12/07/2011    Procedure: CYSTOSCOPY/RETROGRADE/URETEROSCOPY;  Surgeon: Kathi Ludwig, MD;  Location: [facility];  Service: Urology;  Laterality: Bilateral;  . Cystoscopy w/ ureteral stent placement  12/07/2011    Procedure: CYSTOSCOPY WITH STENT REPLACEMENT;  Surgeon: Kathi Ludwig, MD;  Location: [facility];  Service: Urology;  Laterality: Bilateral;  (BIL) JJ STENT EXCHANGE  . Cystoscopy with urethral dilatation  12/07/2011    Procedure: CYSTOSCOPY WITH URETHRAL DILATATION;  Surgeon: Kathi Ludwig, MD;  Location: [facility];  Service: Urology;;     Levon Hedger MS, RD, LDN [phone] Pager [phone] After Hours Pager

## 2012-08-18 NOTE — ED Provider Notes (Signed)
Medical screening examination/treatment/procedure(s) were conducted as a shared visit with non-physician practitioner(s) and myself.  I personally evaluated the patient during the encounter.    Level V caveat for inability to express himself.   Suspected source of elevated white count is a PICC line infection.   Urine also infected.   Admit to hospitalist.  Donnetta Hutching, MD 08/18/12 [phone]

## 2012-08-18 NOTE — Plan of Care (Signed)
Problem: Phase I Progression Outcomes Goal: Voiding-avoid urinary catheter unless indicated Outcome: Progressing Condom cath     

## 2012-08-19 LAB — BASIC METABOLIC PANEL
Calcium: 9.3 mg/dL (ref 8.4–10.5)
GFR calc Af Amer: 30 mL/min — ABNORMAL LOW (ref >90)
GFR calc non Af Amer: 26 mL/min — ABNORMAL LOW (ref >90)
Potassium: 4.1 mEq/L (ref 3.5–5.1)
Sodium: 137 mEq/L (ref 135–145)

## 2012-08-19 LAB — GLUCOSE, CAPILLARY: Glucose-Capillary: 113 mg/dL — ABNORMAL HIGH (ref 70–99)

## 2012-08-19 LAB — CBC
Hemoglobin: 9.8 g/dL — ABNORMAL LOW (ref 13.0–17.0)
Platelets: 129 10*3/uL — ABNORMAL LOW (ref 150–400)
RBC: 3.67 MIL/uL — ABNORMAL LOW (ref 4.22–5.81)

## 2012-08-19 MED ORDER — ENOXAPARIN SODIUM 30 MG/0.3ML ~~LOC~~ SOLN
30.0000 mg | SUBCUTANEOUS | Status: DC
  Administered 2012-08-19: 30 mg via SUBCUTANEOUS
  Filled 2012-08-19 (×2): qty 0.3

## 2012-08-19 NOTE — Progress Notes (Signed)
Clinical Social Work Department BRIEF PSYCHOSOCIAL ASSESSMENT 08/19/2012  Patient:  Justin Sherman,Justin Sherman     Account Number:  [id number]     Admit date:  08/17/2012  Clinical Social Worker:  Jacelyn Grip  Date/Time:  08/19/2012 03:45 PM  Referred by:  Physician  Date Referred:  08/19/2012 Referred for  SNF Placement   Other Referral:   Interview type:  Patient Other interview type:    PSYCHOSOCIAL DATA Living Status:  FACILITY Admitted from facility:  [facility] Level of care:  Skilled Nursing Facility Primary support name:  Jimmy Andreen/brother Primary support relationship to patient:  S[place] Degree of support available:   adequate    CURRENT CONCERNS Current Concerns  Post-Acute Placement   Other Concerns:    SOCIAL WORK ASSESSMENT / PLAN CSW met with pt re: return to [facility].    Pt limited in his ability to express needs, but did state that he plans to return to [facility].    CSW spoke with SNF Representative and pt can return to facility when medically stable.    CSW to continue to follow and facilitate pt discharge needs when pt medically stable for discharge.   Assessment/plan status:  Psychosocial Support/Ongoing Assessment of Needs Other assessment/ plan:   discharge planning   Information/referral to community resources:   Referral back to [facility]    PATIENT'S/FAMILY'S RESPONSE TO PLAN OF CARE: Pt is limited in his ability to communicate, but was able to state plans to return to [facility].     Jacklynn Lewis, MSW, LCSWA  Clinical Social Work [phone]

## 2012-08-19 NOTE — Progress Notes (Addendum)
[facility] PROGRESS NOTE  Justin Sherman JYN:[id number] DOB: Dec 01, 1940 DOA: 08/17/2012 PCP: Kimber Relic, MD  Brief narrative: Justin Sherman is an 72 y.o. male with a  PMH of stage III chronic kidney disease, history of urinary retention status post double J stent placement, insulin-dependent diabetes, dementia, and urinary tract infection requiring a prolonged course of IV Primaxin for which a PICC line was placed, who was admitted to the hospital from his SNF on 08/17/2012 with a PICC line infection. When the PICC line was removed, pus was noted at the insertion site. Blood cultures and cultures of the catheter tip have been sent.  Assessment/Plan: Principal Problem:   Sepsis secondary to PICC line infection -Site inflamed and pus noted at entry site upon removal. Followup cultures of catheter tip. -Followup blood cultures. -Continue empiric vancomycin and Zosyn. -PICC line was initially placed for ESBL bacteremia. Active Problems:   Normocytic anemia of chronic kidney disease -Hemoglobin stable, monitor. No current indications for transfusion.   Hyperlipidemia -Continue statin therapy.   Diabetes -Currently receiving Lantus 5 units each bedtime and moderate scale SSI. -CBGs 86-149.   COPD -Stable on bronchodilator treatments as needed and Advair.   Depression -Continue Celexa.   Dementia -Return to skilled nursing facility when stable.   Leukocytosis -White blood cell count trending down on antibiotics.   CKD (chronic kidney disease) stage 3, GFR 30-59 ml/min -Baseline creatinine 1.9. Monitor closely. Creatinine trending up.   UTI (lower urinary tract infection) -Indwelling double-J stent placed August 2013. -Persistently abnormal urinalysis in the face of negative culture results noted. -Followup urine cultures.  Code Status: Full. Family Communication: No family at the bedside. Disposition Plan: Back to SNF when stable.   Medical Consultants:  None.  Other  Consultants:  None.  Anti-infectives:  Vancomycin 08/17/2012--->  Zosyn 08/17/2012--->  HPI/Subjective: Justin Sherman is awake and alert. Denies pain, nausea, vomiting, diarrhea, dyspnea. He is still running fevers, but fever curve down since yesterday at 6 PM when he was 101.2.  Objective: Filed Vitals:   08/18/12 1334 08/18/12 1836 08/18/12 2125 08/19/12 0645  BP: 129/72  116/55 148/71  Pulse: 81  70 79  Temp: 99 F (37.2 C) 101.2 F (38.4 C) 98.2 F (36.8 C) 97.6 F (36.4 C)  TempSrc:   Oral Oral  Resp: 18  16 16   Weight:      SpO2: 99%  90% 100%    Intake/Output Summary (Last 24 hours) at 08/19/12 1315 Last data filed at 08/19/12 1017  Gross per 24 hour  Intake   2252 ml  Output    920 ml  Net   1332 ml    Exam: Gen:  NAD Cardiovascular:  RRR, No M/R/G Respiratory:  Lungs CTAB Gastrointestinal:  Abdomen soft, NT/ND, + BS Extremities:  Right upper extremity inflamed, erythematous, swollen and tender.  Data Reviewed: Basic Metabolic Panel:  Recent Labs Lab 08/17/12 1000 08/18/12 0140 08/19/12 0425  NA 131* 135 137  K 4.5 4.3 4.1  CL 97 99 100  CO2 25 28 31   GLUCOSE 135* 137* 130*  BUN 30* 29* 30*  CREATININE 1.93* 2.22* 2.35*  CALCIUM 9.8 9.5 9.3   GFR The CrCl is unknown because both a height and weight (above a minimum accepted value) are required for this calculation. Liver Function Tests:  Recent Labs Lab 08/17/12 1000  AST 11  ALT 7  ALKPHOS 76  BILITOT 0.5  PROT 7.1  ALBUMIN 3.1*   CBC:  Recent Labs Lab 08/17/12  1000 08/18/12 0140 08/19/12 0425  WBC 20.5* 12.7* 10.9*  NEUTROABS 17.5*  --   --   HGB 10.9* 10.6* 9.8*  HCT 34.3* 33.7* 31.9*  MCV 85.1 86.2 86.9  PLT 165 136* 129*   CBG:  Recent Labs Lab 08/18/12 0802 08/18/12 1201 08/18/12 1631 08/18/12 2123 08/19/12 0710  GLUCAP 113* 143* 86 149* 124*   Hgb A1c  Recent Labs  08/17/12 1000  HGBA1C 6.1*   Microbiology Recent Results (from the past 240  hour(s))  URINE CULTURE     Status: None   Collection Time    08/17/12 11:50 AM      Result Value Range Status   Specimen Description URINE, CLEAN CATCH   Final   Special Requests NONE   Final   Culture  Setup Time 08/17/2012 18:55   Final   Colony Count PENDING   Incomplete   Culture Culture reincubated for better growth   Final   Report Status PENDING   Incomplete  MRSA PCR SCREENING     Status: Abnormal   Collection Time    08/17/12  5:32 PM      Result Value Range Status   MRSA by PCR POSITIVE (*) NEGATIVE Final   Comment:            The GeneXpert MRSA Assay (FDA     approved for NASAL specimens     only), is one component of a     comprehensive MRSA colonization     surveillance program. It is not     intended to diagnose MRSA     infection nor to guide or     monitor treatment for     MRSA infections.     RESULT CALLED TO, READ BACK BY AND VERIFIED WITH:     Ledora Bottcher [id number] @ 2014 BY J SCOTTON  CULTURE, BLOOD (ROUTINE X 2)     Status: None   Collection Time    08/18/12  1:40 AM      Result Value Range Status   Specimen Description BLOOD LEFT ANTECUBITAL   Final   Special Requests Normal BOTTLES DRAWN AEROBIC AND ANAEROBIC 4CC   Final   Culture  Setup Time 08/18/2012 10:56   Final   Culture     Final   Value:        BLOOD CULTURE RECEIVED NO GROWTH TO DATE CULTURE WILL BE HELD FOR 5 DAYS BEFORE ISSUING A FINAL NEGATIVE REPORT   Report Status PENDING   Incomplete  CULTURE, BLOOD (ROUTINE X 2)     Status: None   Collection Time    08/18/12  1:44 AM      Result Value Range Status   Specimen Description BLOOD L ARM   Final   Special Requests Normal BOTTLES DRAWN AEROBIC AND ANAEROBIC 3CC   Final   Culture  Setup Time 08/18/2012 10:55   Final   Culture     Final   Value:        BLOOD CULTURE RECEIVED NO GROWTH TO DATE CULTURE WILL BE HELD FOR 5 DAYS BEFORE ISSUING A FINAL NEGATIVE REPORT   Report Status PENDING   Incomplete  CATH TIP CULTURE     Status: None    Collection Time    08/18/12  5:29 PM      Result Value Range Status   Specimen Description CATH TIP   Final   Special Requests NONE   Final   Culture NO GROWTH   Final  Report Status PENDING   Incomplete     Procedures and Diagnostic Studies: Dg Chest 2 View  08/17/2012  *RADIOLOGY REPORT*  Clinical Data: Nausea.  CHEST - 2 VIEW  Comparison: None.  Findings: Heart size is normal.  The lungs are clear.  Mild rightward curvature is evident in the thoracic spine.  Degenerative endplate changes are noted.  Degenerative changes are present at the shoulders bilaterally.  IMPRESSION: No acute cardiopulmonary disease.   Original Report Authenticated By: Marin Roberts, M.D.     Scheduled Meds: . aspirin EC  81 mg Oral Daily  . Chlorhexidine Gluconate Cloth  6 each Topical Q0600  . citalopram  20 mg Oral QHS  . enoxaparin (LOVENOX) injection  30 mg Subcutaneous Q24H  . famotidine  10 mg Oral Daily  . feeding supplement  237 mL Oral TID BM  . ferrous sulfate  325 mg Oral Q breakfast  . guaiFENesin  600 mg Oral BID  . insulin aspart  0-15 Units Subcutaneous TID WC  . insulin aspart  4 Units Subcutaneous TID WC  . insulin glargine  5 Units Subcutaneous QHS  . mometasone-formoterol  2 puff Inhalation BID  . multivitamin with minerals  1 tablet Oral Daily  . mupirocin ointment  1 application Nasal BID  . piperacillin-tazobactam (ZOSYN)  IV  3.375 g Intravenous Q8H  . polyethylene glycol  17 g Oral Daily  . simvastatin  40 mg Oral QHS  . sodium chloride  3 mL Intravenous Q12H  . thiamine  100 mg Oral q morning - 10a  . vancomycin  1,000 mg Intravenous Q24H   Continuous Infusions:   Time spent: 25 minutes.   LOS: 2 days   Loranda Mastel  [facility] Pager [phone].  If 8PM-8AM, please contact night-coverage at [email or web address], password [facility] 08/19/2012, 1:15 PM

## 2012-08-19 NOTE — Progress Notes (Signed)
Pt appears to be in good spirits. Rt. Upper arm is redden, no drainage on dressing. Appetite good. Repositioned q 2 hours. Rt. Sided weakness from CVA PTA.

## 2012-08-20 DIAGNOSIS — F3289 Other specified depressive episodes: Secondary | ICD-10-CM

## 2012-08-20 DIAGNOSIS — J4489 Other specified chronic obstructive pulmonary disease: Secondary | ICD-10-CM

## 2012-08-20 DIAGNOSIS — F329 Major depressive disorder, single episode, unspecified: Secondary | ICD-10-CM

## 2012-08-20 DIAGNOSIS — J449 Chronic obstructive pulmonary disease, unspecified: Secondary | ICD-10-CM

## 2012-08-20 LAB — CBC
Hemoglobin: 9.5 g/dL — ABNORMAL LOW (ref 13.0–17.0)
MCH: 26.7 pg (ref 26.0–34.0)
MCHC: 30.9 g/dL (ref 30.0–36.0)
Platelets: 142 10*3/uL — ABNORMAL LOW (ref 150–400)
RBC: 3.56 MIL/uL — ABNORMAL LOW (ref 4.22–5.81)

## 2012-08-20 LAB — BASIC METABOLIC PANEL
BUN: 30 mg/dL — ABNORMAL HIGH (ref 6–23)
Calcium: 9.4 mg/dL (ref 8.4–10.5)
GFR calc Af Amer: 36 mL/min — ABNORMAL LOW (ref >90)
GFR calc non Af Amer: 31 mL/min — ABNORMAL LOW (ref >90)
Glucose, Bld: 114 mg/dL — ABNORMAL HIGH (ref 70–99)
Potassium: 4.2 mEq/L (ref 3.5–5.1)
Sodium: 136 mEq/L (ref 135–145)

## 2012-08-20 LAB — GLUCOSE, CAPILLARY: Glucose-Capillary: 109 mg/dL — ABNORMAL HIGH (ref 70–99)

## 2012-08-20 MED ORDER — OXYCODONE HCL 5 MG PO TABS: 10.0000 mg | ORAL_TABLET | ORAL | Status: DC | PRN

## 2012-08-20 MED ORDER — LORAZEPAM 0.5 MG PO TABS: 0.5000 mg | ORAL_TABLET | Freq: Every day | ORAL | Status: DC | PRN

## 2012-08-20 NOTE — Discharge Summary (Signed)
Physician Discharge Summary  Phu Record GNF:[id number] DOB: [birth year] DOA: 08/17/2012  PCP: Kimber Relic, MD  Admit date: 08/17/2012 Discharge date: 08/20/2012  Recommendations for Outpatient Follow-up:  1. I spoke with ID physician on call Dr. Luciana Axe in regards to antibiotic management for this patient. The following was the recommendation: Since PICC line is removed and there is no positive PICC line tip culture, blood cultures are negative and the patient has already received at least 2 weeks of antibiotics (please note that he was on Primaxin since 08/03/2012) as long as the PICC line is removed patient does not need to be on further antibiotics. Also, please note that even though urine culture is growing gram-negative rods per infectious disease this is most likely colonization as patient has no urinary complaints. Please note that the white blood cell count is within normal limits and patient has been afebrile since admission.  Discharge Diagnoses:  Principal Problem:   PICC line infection Active Problems:   Type II or unspecified type diabetes mellitus without mention of complication, not stated as uncontrolled   Dementia   Leukocytosis   CKD (chronic kidney disease) stage 3, GFR 30-59 ml/min   UTI (lower urinary tract infection)   Normocytic anemia   Other and unspecified hyperlipidemia   COPD, moderate   Depression   Sepsis   Discharge Condition: medically stable for discharge to [facility] today  Diet recommendation: as tolerated  History of present illness:  72 year old male with a PMH of stage III chronic kidney disease, history of urinary retention status post double J stent placement, insulin-dependent diabetes, dementia, and Klebsiella bacteremia (08/03/2012) requiring IV Primaxin for which a PICC line was placed, who was admitted to the hospital from his SNF on 08/17/2012 with what seemed a  possiblePICC line infection. When the PICC line was removed, pus was noted at the  insertion site. Blood cultures to date are negative and catheter tip culture is negative. As mentioned above, per infectious disease recommendations we will discontinue antibiotics if there is no evidence of either bacteremia or PICC line infection. As long as PICC line is removed and considering that the patient has already received at least 2 weeks of IV antibiotics, patient does not need to be discharged on antibiotics.  Assessment/Plan:   Principal Problem:  Possible PICC line infection, sepsis - Pus noted on entry site point PICC line was removed. Tip catheter culture negative - Blood cultures since admission are negative - We will discontinue antibiotics prior to discharge  Active Problems:  Possible urinary tract infection - I spoke with infectious disease, Dr. Verdie Drown who recommended d/c antibiotics as this is likely colonization and patient is becoming increasingly resistant to antibiotics. Patient has no GU complaints, WBC count is WNL and patient has been afebrile since admission.  Normocytic anemia of chronic kidney disease  - Hemoglobin stable in 9.5-10.6 range - No indications for transfusion - Continue ferrous sulfate 325 mg daily Hyperlipidemia  -Continue simvastatin 40 mg at bedtime Diabetes  - Continue NovoLog 4 units 3 times a day with meals and Lantus 5 units at bedtime COPD  - Stable on bronchodilator treatments as needed and Advair.  Depression  - Continue Celexa.  Dementia  - stable Leukocytosis  - White blood cell count is within normal in CKD (chronic kidney disease) stage 3, GFR 30-59 ml/min  - Baseline creatinine is 1.9 - Creatinine is trending down, 2.02 today UTI (lower urinary tract infection)  - Indwelling double-J stent placed August 2013.  -  Persistently abnormal urinalysis in the face of negative culture results noted. When spoke with infectious disease they recommended stopping antibiotics and this is most likely colonization. Patient is becoming  increasingly resistant to almost all antibiotics so unless he is symptomatic we do not need to continue antibiotics.  Code Status: Full.  Family Communication: No family at the bedside.    Medical Consultants:  None. Other Consultants:  None. Anti-infectives:  Vancomycin 08/17/2012---> 08/20/2012 Zosyn 08/17/2012--->08/20/2012   Discharge Exam: Filed Vitals:   08/20/12 0500  BP: 105/61  Pulse: 69  Temp: 97.8 F (36.6 C)  Resp: 20   Filed Vitals:   08/19/12 2023 08/19/12 2100 08/20/12 0500 08/20/12 0806  BP:  133/55 105/61   Pulse:  70 69   Temp:  97.6 F (36.4 C) 97.8 F (36.6 C)   TempSrc:  Oral Oral   Resp:  20 20   Weight:      SpO2: 98% 100% 100% 97%    General: Pt is alert, follows commands appropriately, not in acute distress Cardiovascular: Regular rate and rhythm, S1/S2 +, no murmurs, no rubs, no gallops Respiratory: Clear to auscultation bilaterally, no wheezing, no crackles, no rhonchi Abdominal: Soft, non tender, non distended, bowel sounds +, no guarding Extremities: no edema, no cyanosis, pulses palpable bilaterally DP and PT Neuro: Grossly nonfocal  Discharge Instructions  Discharge Orders   Future Orders Complete By Expires     Call MD for:  difficulty breathing, headache or visual disturbances  As directed     Call MD for:  persistant dizziness or light-headedness  As directed     Call MD for:  persistant nausea and vomiting  As directed     Call MD for:  severe uncontrolled pain  As directed     Diet - low sodium heart healthy  As directed     Increase activity slowly  As directed         Medication List    STOP taking these medications       PRIMAXIN IV 500 MG injection  Generic drug:  imipenem-cilastatin      TAKE these medications       albuterol (2.5 MG/3ML) 0.083% nebulizer solution  Commonly known as:  PROVENTIL  Take 2.5 mg by nebulization every 2 (two) hours as needed for wheezing.     aspirin EC 81 MG tablet  Take 81 mg by  mouth daily.     citalopram 20 MG tablet  Commonly known as:  CELEXA  Take 20 mg by mouth at bedtime.     ferrous sulfate 325 (65 FE) MG tablet  Take 325 mg by mouth daily with breakfast.     Fluticasone-Salmeterol 250-50 MCG/DOSE Aepb  Commonly known as:  ADVAIR  Inhale 1 puff into the lungs 2 (two) times daily.     guaiFENesin 600 MG 12 hr tablet  Commonly known as:  MUCINEX  Take 600 mg by mouth 2 (two) times daily.     insulin aspart 100 UNIT/ML injection  Commonly known as:  novoLOG  Inject 5 Units into the skin 4 (four) times daily -  before meals and at bedtime. Give for CBG > 150.  Before meals and at bedtime     insulin glargine 100 UNIT/ML injection  Commonly known as:  LANTUS  Inject 5 Units into the skin at bedtime.     ipratropium-albuterol 0.5-2.5 (3) MG/3ML Soln  Commonly known as:  DUONEB  Take 3 mLs by nebulization every 6 (six)  hours as needed. For shortness of breath     LORazepam 0.5 MG tablet  Commonly known as:  ATIVAN  Take 1 tablet (0.5 mg total) by mouth daily as needed for anxiety.     multivitamins ther. w/minerals Tabs  Take 1 tablet by mouth daily.     oxyCODONE 5 MG immediate release tablet  Commonly known as:  Oxy IR/ROXICODONE  Take 2 tablets (10 mg total) by mouth every 4 (four) hours as needed for pain. For pain     polyethylene glycol packet  Commonly known as:  MIRALAX / GLYCOLAX  Take 17 g by mouth daily.     ranitidine 75 MG tablet  Commonly known as:  ZANTAC  Take 75 mg by mouth daily as needed for heartburn.     simvastatin 40 MG tablet  Commonly known as:  ZOCOR  Take 40 mg by mouth at bedtime.     thiamine 100 MG tablet  Take 100 mg by mouth every morning.           Follow-up Information   Follow up with GREEN, Lenon Curt, MD In 1 week.   Contact information:   [address] [place] [address] [phone]        The results of significant diagnostics from this hospitalization (including imaging,  microbiology, ancillary and laboratory) are listed below for reference.    Significant Diagnostic Studies: Dg Chest 2 View 08/17/2012  IMPRESSION: No acute cardiopulmonary disease.   Original Report Authenticated By: Marin Roberts, M.D.   Ir Fluoro Guide Cv Line Right 07/30/2012   * IMPRESSION: Successful placement of a right basilic vein single lumen power PICC with sonographic and fluoroscopic guidance.  The catheter is ready for use.  Signed,  Sterling Big, MD Vascular & Interventional Radiologist [facility]   Original Report Authenticated By: Malachy Moan, M.D.   Ir US Guide Vasc Access Right 07/30/2012   * IMPRESSION: Successful placement of a right basilic vein single lumen power PICC with sonographic and fluoroscopic guidance.  The catheter is ready for use.  Signed,  Sterling Big, MD Vascular & Interventional Radiologist [facility]   Original Report Authenticated By: Malachy Moan, M.D.    Microbiology: URINE CULTURE     Status: None   Collection Time    08/17/12 11:50 AM      Result Value Range Status   Specimen Description URINE  Final   Colony Count >=100,000 COLONIES/ML   Final   Culture GRAM NEGATIVE RODS   Final   Report Status PENDING   Incomplete  MRSA PCR SCREENING     Status: Abnormal   Collection Time    08/17/12  5:32 PM      Result Value Range Status   MRSA by PCR POSITIVE (*) NEGATIVE Final  CULTURE, BLOOD (ROUTINE X 2)     Status: None   Collection Time    08/18/12  1:40 AM      Result Value Range Status   Specimen Description BLOOD LEFT ANTECUBITAL   Final   Special Requests Normal BOTTLES DRAWN AEROBIC AND ANAEROBIC 4CC   Final   Culture  Setup Time 08/18/2012 10:56   Final   Culture     Final   Value:        BLOOD CULTURE RECEIVED NO GROWTH TO DATE CULTURE WILL BE HELD FOR 5 DAYS BEFORE ISSUING A FINAL NEGATIVE REPORT   Report Status PENDING   Incomplete  CULTURE, BLOOD (ROUTINE X 2)  Status: None    Collection Time    08/18/12  1:44 AM      Result Value Range Status   Specimen Description BLOOD L ARM   Final   Special Requests Normal BOTTLES DRAWN AEROBIC AND ANAEROBIC 3CC   Final   Culture  Setup Time 08/18/2012 10:55   Final   Culture     Final   Value:        BLOOD CULTURE RECEIVED NO GROWTH TO DATE CULTURE WILL BE HELD FOR 5 DAYS BEFORE ISSUING A FINAL NEGATIVE REPORT   Report Status PENDING   Incomplete  CATH TIP CULTURE     Status: None   Collection Time    08/18/12  5:29 PM      Result Value Range Status   Specimen Description CATH TIP   Final   Special Requests NONE   Final   Culture Culture reincubated for better growth   Final   Report Status PENDING   Incomplete     Labs: Basic Metabolic Panel:  Recent Labs Lab 08/17/12 1000 08/18/12 0140 08/19/12 0425 08/20/12 0410  NA 131* 135 137 136  K 4.5 4.3 4.1 4.2  CL 97 99 100 100  CO2 25 28 31 30   GLUCOSE 135* 137* 130* 114*  BUN 30* 29* 30* 30*  CREATININE 1.93* 2.22* 2.35* 2.02*  CALCIUM 9.8 9.5 9.3 9.4   Liver Function Tests:  Recent Labs Lab 08/17/12 1000  AST 11  ALT 7  ALKPHOS 76  BILITOT 0.5  PROT 7.1  ALBUMIN 3.1*   CBC:  Recent Labs Lab 08/17/12 1000 08/18/12 0140 08/19/12 0425 08/20/12 0410  WBC 20.5* 12.7* 10.9* 8.9  NEUTROABS 17.5*  --   --   --   HGB 10.9* 10.6* 9.8* 9.5*  HCT 34.3* 33.7* 31.9* 30.7*  MCV 85.1 86.2 86.9 86.2  PLT 165 136* 129* 142*   CBG:  Recent Labs Lab 08/18/12 2123 08/19/12 0710 08/19/12 1707 08/19/12 2208 08/20/12 0722  GLUCAP 149* 124* 113* 173* 109*    Time coordinating discharge: Over 30 minutes  Signed:  Manson Passey, MD  [facility]  08/20/2012, 11:13 AM  Pager #: [phone]

## 2012-08-20 NOTE — Progress Notes (Signed)
Pt for discharge to [facility].  CSW facilitated pt discharge needs including contacting facility, faxing pt discharge information via [facility], discussing with pt at bedside, providing RN phone number to call report, and arranging ambulance transport for pt.   No further social work needs identified at this time.  CSW signing off.   Jacklynn Lewis, MSW, LCSWA  Clinical Social Work [phone]

## 2012-08-20 NOTE — Progress Notes (Signed)
Report called to Ola at [facility].  Philomena Doheny RN

## 2012-08-22 LAB — CATH TIP CULTURE

## 2012-08-24 LAB — CULTURE, BLOOD (ROUTINE X 2)
Culture: NO GROWTH
Special Requests: NORMAL

## 2012-08-24 LAB — URINE CULTURE

## 2012-08-29 ENCOUNTER — Other Ambulatory Visit: Payer: Self-pay | Admitting: *Deleted

## 2012-08-29 MED ORDER — OXYCODONE HCL 10 MG PO TABS: ORAL_TABLET | ORAL | Status: DC

## 2012-08-29 MED ORDER — LORAZEPAM 0.5 MG PO TABS: ORAL_TABLET | ORAL | Status: DC

## 2012-08-29 NOTE — Telephone Encounter (Signed)
error 

## 2012-09-02 ENCOUNTER — Other Ambulatory Visit: Payer: Self-pay | Admitting: Geriatric Medicine

## 2012-09-02 MED ORDER — LORAZEPAM 0.5 MG PO TABS: ORAL_TABLET | ORAL | Status: DC

## 2012-09-17 ENCOUNTER — Non-Acute Institutional Stay (SKILLED_NURSING_FACILITY): Payer: Medicare Other | Admitting: Internal Medicine

## 2012-09-17 DIAGNOSIS — J4489 Other specified chronic obstructive pulmonary disease: Secondary | ICD-10-CM

## 2012-09-17 DIAGNOSIS — J449 Chronic obstructive pulmonary disease, unspecified: Secondary | ICD-10-CM

## 2012-09-17 DIAGNOSIS — N201 Calculus of ureter: Secondary | ICD-10-CM

## 2012-09-17 NOTE — Progress Notes (Signed)
Patient ID: Justin Sherman, male   DOB: Dec 16, 1940, 72 y.o.   MRN: [id number]   Chief Complaint: kidney stones--? 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  Justin Sherman has aphasia and is unable to make decisions for himself reasonably at this point.  He does not understand his situation.  Upon speaking with his family, they feel he is better served not to go through any further urologic procedures which may affect his cognition.  We discussed that though he is here at a nursing home dependent in all ADLs, he seems to be happy and his quality of life is as good as it can be given his circumstances.  They prefer not to "rock the boat".  We did discuss that he could potentially develop a significant obstruction from a stone which may be painful or develop an infection.  The infection could be treated, but may be difficult to resolve with the stones in place.  Justin Sherman's brother expressed understanding of this, and still favors leaving his brother alone w/o further surgery at this time.  If exchanging of the stents can also be avoided, he would favor avoiding that, as well.  We reviewed his code status once again, and they still do want full code b/c Justin Sherman has said this for himself (we discussed that he may not fully understand this, but apparently another brother in [place] is adamant about Justin Sherman being able to recover in some way from his stroke).    Review of Systems:  Review of Systems  Constitutional: Negative for fever, chills, weight loss and malaise/fatigue.  HENT: Negative for congestion.   Eyes: Negative for blurred vision.  Respiratory: Negative for shortness of breath.        Has chronic COPD  Cardiovascular: Negative for chest pain.  Gastrointestinal: Negative for  heartburn, nausea, vomiting and constipation.  Musculoskeletal: Negative for myalgias and back pain.  Skin: Negative for rash.  Neurological: Positive for sensory change, speech change and focal weakness.  Endo/Heme/Allergies: Does not bruise/bleed easily.  Psychiatric/Behavioral: Positive for memory loss.       Mood is positive, has memory loss and aphasia since stroke over 3 years ago   Medications: Patient's Medications  New Prescriptions   No medications on file  Previous Medications   ALBUTEROL (PROVENTIL) (2.5 MG/3ML) 0.083% NEBULIZER SOLUTION    Take 2.5 mg by nebulization every 2 (two) hours as needed for wheezing.   ASPIRIN EC 81 MG TABLET    Take 81 mg by mouth daily.   CITALOPRAM (CELEXA) 20 MG TABLET    Take 20 mg by mouth at bedtime.    FERROUS SULFATE 325 (65 FE) MG TABLET    Take 325 mg by mouth daily with breakfast.    FLUTICASONE-SALMETEROL (ADVAIR) 250-50 MCG/DOSE AEPB    Inhale 1 puff into the lungs 2 (two) times daily.    GUAIFENESIN (MUCINEX) 600 MG 12 HR TABLET    Take 600 mg by mouth 2 (two) times daily.    INSULIN ASPART (NOVOLOG) 100 UNIT/ML INJECTION    Inject 5 Units into the skin 4 (four) times daily -  before meals and at bedtime. Give for CBG > 150. Before meals and at bedtime   INSULIN GLARGINE (LANTUS) 100 UNIT/ML INJECTION    Inject 5 Units into the  skin at bedtime.    IPRATROPIUM-ALBUTEROL (DUONEB) 0.5-2.5 (3) MG/3ML SOLN    Take 3 mLs by nebulization every 6 (six) hours as needed. For shortness of breath   LORAZEPAM (ATIVAN) 0.5 MG TABLET    Take 1/2 tablet once a day as needed   MULTIPLE VITAMINS-MINERALS (MULTIVITAMINS THER. W/MINERALS) TABS    Take 1 tablet by mouth daily.    OXYCODONE (OXY IR/ROXICODONE) 5 MG IMMEDIATE RELEASE TABLET    Take 2 tablets (10 mg total) by mouth every 4 (four) hours as needed for pain. For pain   OXYCODONE HCL 10 MG TABS    Take one tablet every 4 hours as needed for pain   POLYETHYLENE GLYCOL (MIRALAX / GLYCOLAX) PACKET     Take 17 g by mouth daily.   RANITIDINE (ZANTAC) 75 MG TABLET    Take 75 mg by mouth daily as needed for heartburn.    SIMVASTATIN (ZOCOR) 40 MG TABLET    Take 40 mg by mouth at bedtime.    THIAMINE 100 MG TABLET    Take 100 mg by mouth every morning.   Modified Medications   No medications on file  Discontinued Medications   No medications on file   Physical Exam: There were no vitals filed for this visit. Physical Exam  Constitutional: He appears well-developed and well-nourished. No distress.  HENT:  Head: Normocephalic and atraumatic.  Aphasic, sometimes answers appropriately, mostly yes or no, a few other statements here and there  Eyes: EOM are normal. Pupils are equal, round, and reactive to light.  Neck: Normal range of motion. No JVD present.  Cardiovascular: Normal rate, regular rhythm, normal heart sounds and intact distal pulses.   Pulmonary/Chest: Effort normal and breath sounds normal.  Abdominal: Soft. Bowel sounds are normal.  Musculoskeletal:  hemiparetic  Neurological: He is alert.  Aphasia, hemiparesis  Skin: Skin is warm and dry. No rash noted.   Labs reviewed: Basic Metabolic Panel:  Recent Labs  29/56/21 0140 08/19/12 0425 08/20/12 0410  NA 135 137 136  K 4.3 4.1 4.2  CL 99 100 100  CO2 28 31 30   GLUCOSE 137* 130* 114*  BUN 29* 30* 30*  CREATININE 2.22* 2.35* 2.02*  CALCIUM 9.5 9.3 9.4    Liver Function Tests:  Recent Labs  08/17/12 1000  AST 11  ALT 7  ALKPHOS 76  BILITOT 0.5  PROT 7.1  ALBUMIN 3.1*    CBC:  Recent Labs  08/17/12 1000 08/18/12 0140 08/19/12 0425 08/20/12 0410  WBC 20.5* 12.7* 10.9* 8.9  NEUTROABS 17.5*  --   --   --   HGB 10.9* 10.6* 9.8* 9.5*  HCT 34.3* 33.7* 31.9* 30.7*  MCV 85.1 86.2 86.9 86.2  PLT 165 136* 129* 142*    Significant Diagnostic Results:  Assessment/Plan Ureteral calculi Discussed with pt's brother and sister-in-law--decision was made not to have any urologic surgery at this time.     COPD, moderate Has frequent exacerbations.  I question if he will come off of a ventilator if he is intubated for surgery.   Family/ staff Communication: Met with pt's brother, sister-in-law Justin Sine [facility]) as above   Goals of care: full code, but no invasive surgery for stone removal at this time--his code status will need to be readdressed

## 2012-09-18 ENCOUNTER — Encounter: Payer: Self-pay | Admitting: Internal Medicine

## 2012-09-18 NOTE — Assessment & Plan Note (Signed)
Has frequent exacerbations.  I question if he will come off of a ventilator if he is intubated for surgery.

## 2012-09-18 NOTE — Assessment & Plan Note (Signed)
Discussed with pt's brother and sister-in-law--decision was made not to have any urologic surgery at this time.

## 2012-11-15 IMAGING — CT CT HEAD W/O CM
2 series · 15 of 30 positions shown, 19 images · non-contrast
Comparison: 08/04/2011 and 07/11/2010.

CLINICAL DATA: Facial laceration status post fall.  History of
stroke.

CT HEAD WITHOUT CONTRAST
TECHNIQUE: Contiguous axial images were obtained from the base of
the skull through the vertex without contrast.

[Series 2: head w/o · axial · non-contrast · 0.43mm/px · z∈[-237,-87]mm · 13 of 36 slices shown, 17 images]
[im 3/36  brain]
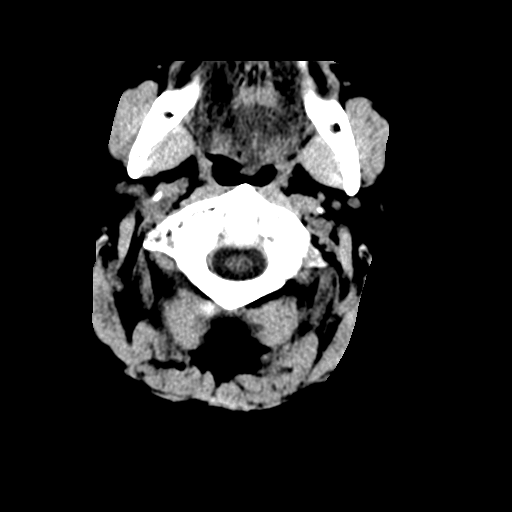
[im 3/36  bone]
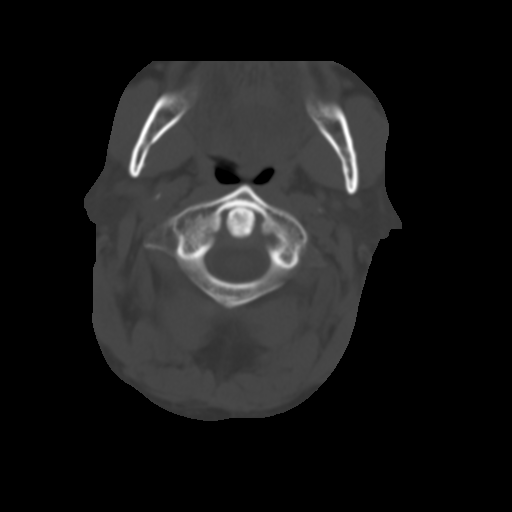
[im 6/36  brain]
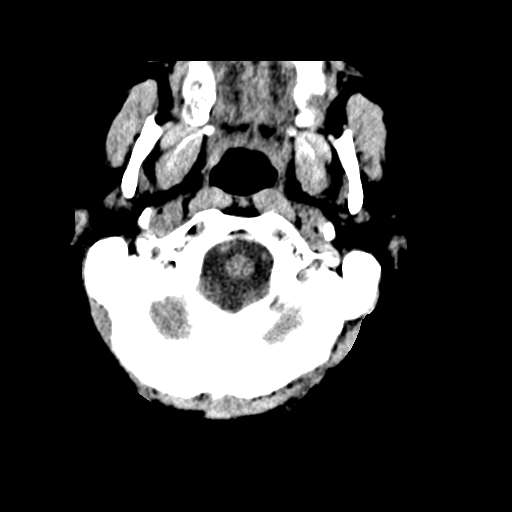
[im 8/36  brain]
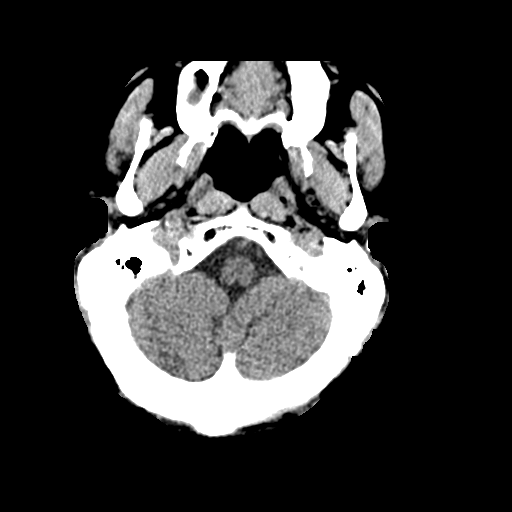
[im 11/36  brain]
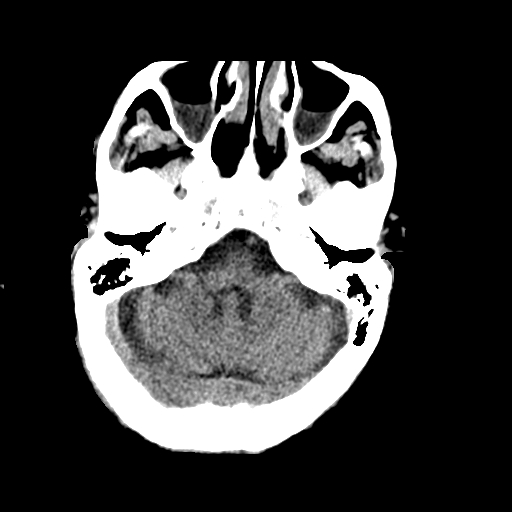
[im 13/36  brain]
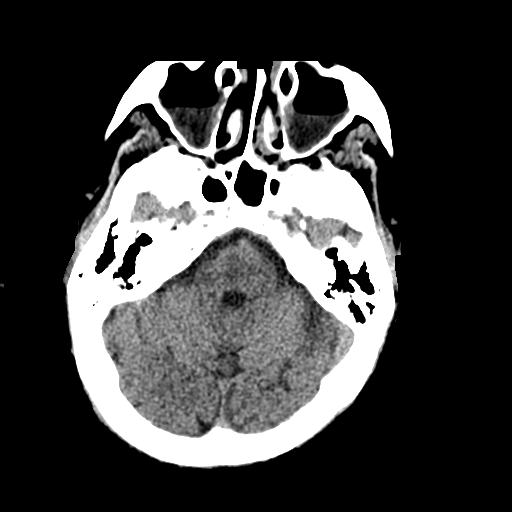
[im 13/36  bone]
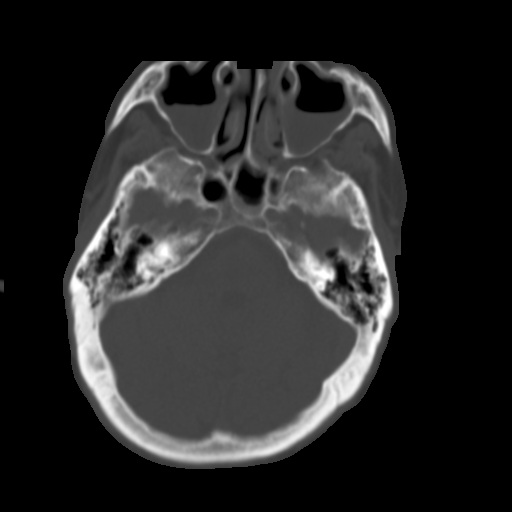
[im 16/36  brain]
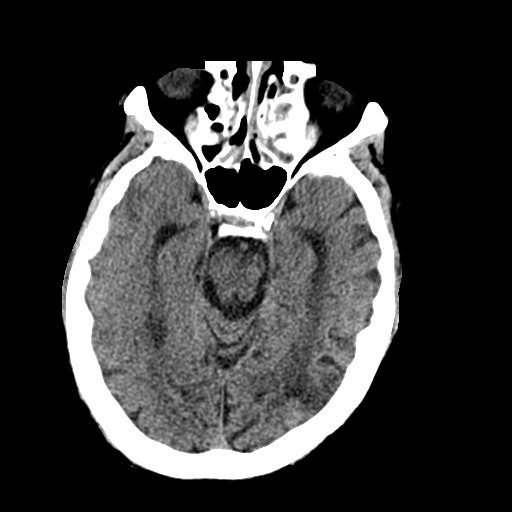
[im 18/36  brain]
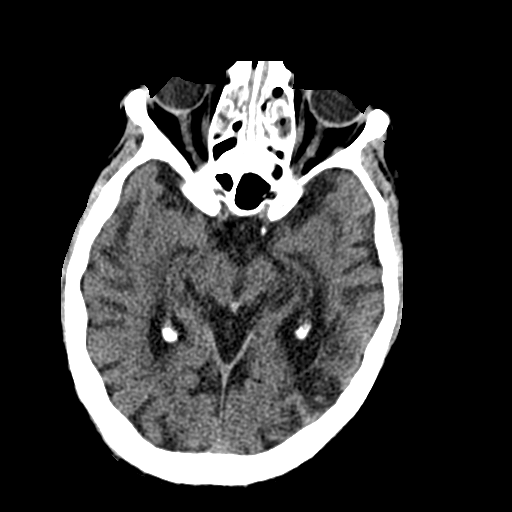
[im 21/36  brain]
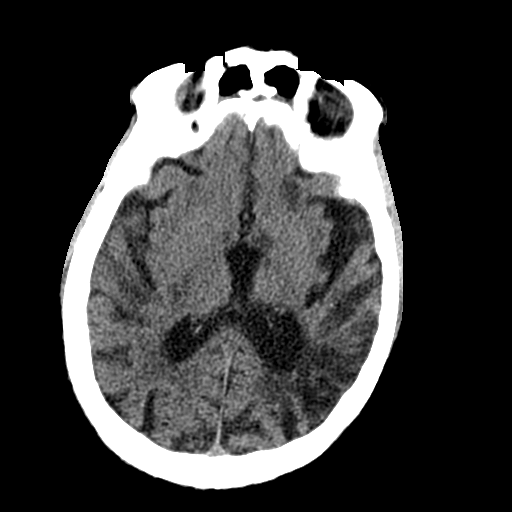
[im 23/36  brain]
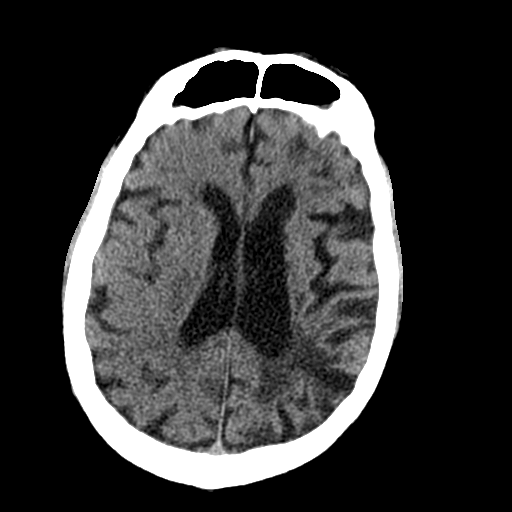
[im 23/36  bone]
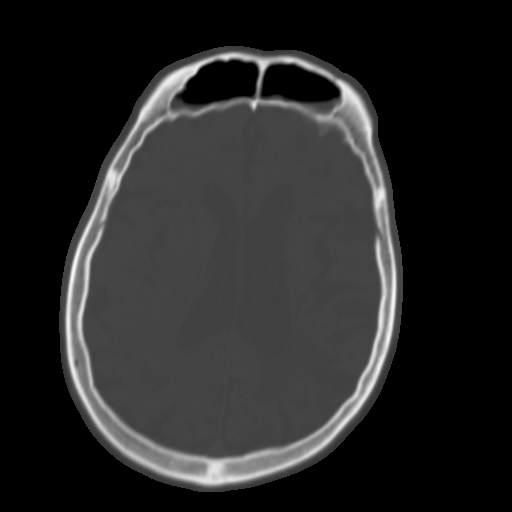
[im 26/36  brain]
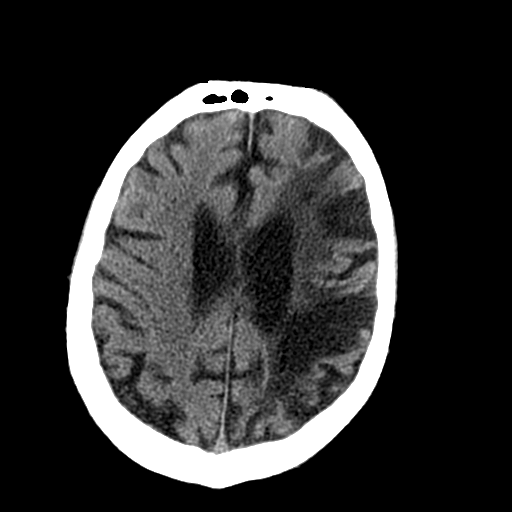
[im 28/36  brain]
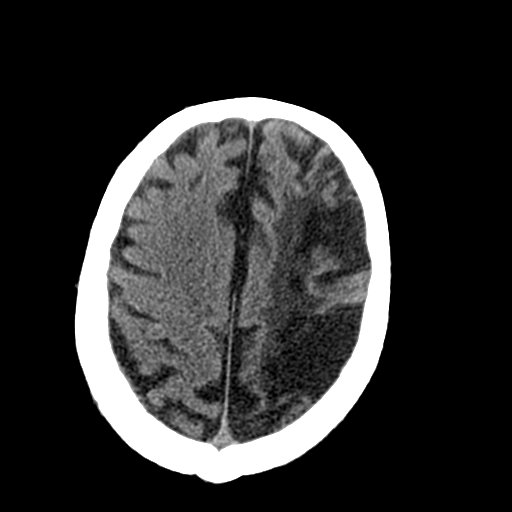
[im 31/36  brain]
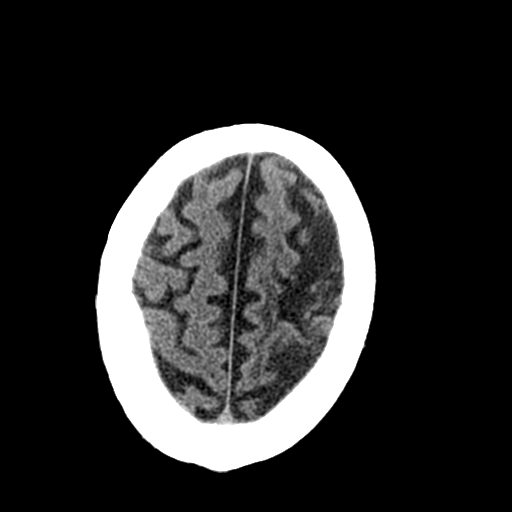
[im 33/36  brain]
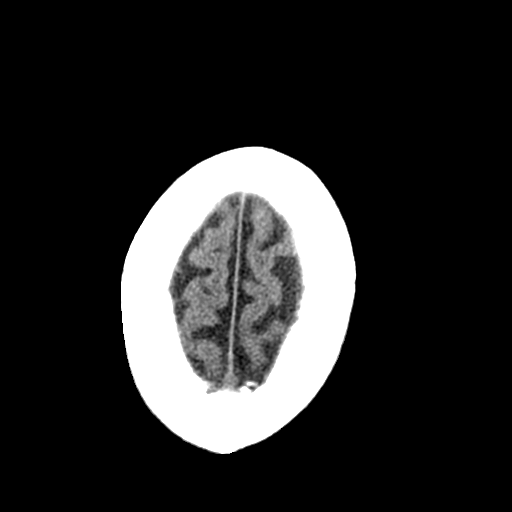
[im 33/36  bone]
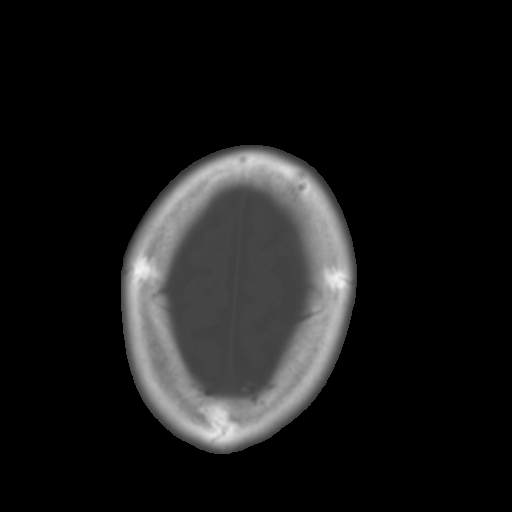

[Series 3: bone windows · axial · 0.43mm/px · z∈[-237,-212]mm · 2 of 36 slices shown]
[im 3/36  bone]
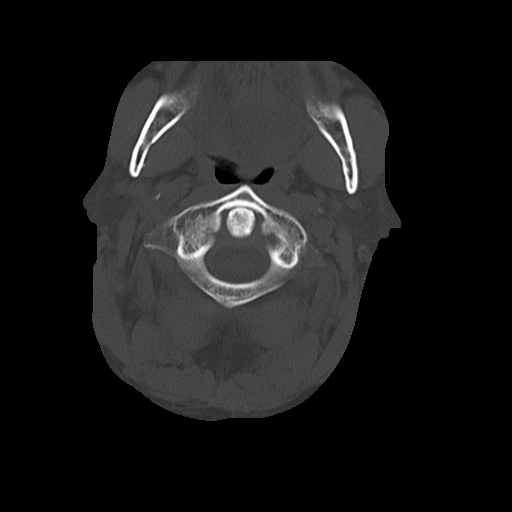
[im 8/36  bone]
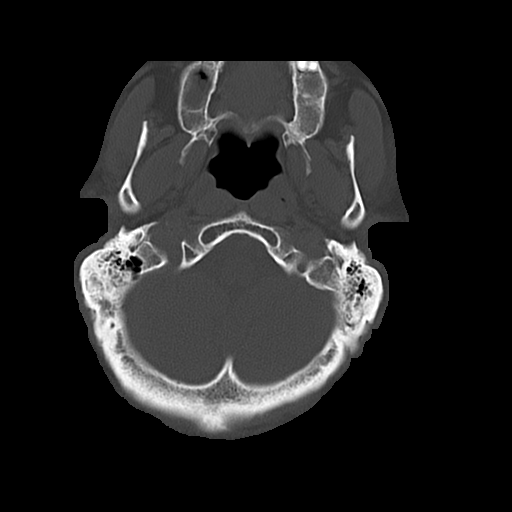

[15 of 30 positions shown; findings below may reference images not displayed]

FINDINGS: There is stable encephalomalacia within the left middle
cerebral artery distribution consistent with an old stroke.  No
acute intracranial hemorrhage, mass lesion, brain edema or extra-
axial fluid collection is seen.  There is generalized atrophy and
mild chronic small vessel ischemic change.

Again demonstrated is diffuse paranasal sinus inflammatory change
with mucosal thickening and near complete opacification of the
ethmoid sinuses bilaterally.  There are air-fluid levels in the
maxillary and frontal sinuses bilaterally.  The mastoids and middle
ears are clear.  The calvarium is intact.
IMPRESSION: 1.  No acute intracranial findings identified.
2.  Old left MCA territory infarct.
3.  Pansinusitis with air-fluid levels suggesting an acute
component.

## 2012-12-15 ENCOUNTER — Encounter: Payer: Self-pay | Admitting: Internal Medicine

## 2012-12-15 ENCOUNTER — Non-Acute Institutional Stay (SKILLED_NURSING_FACILITY): Payer: Medicare Other | Admitting: Internal Medicine

## 2012-12-15 DIAGNOSIS — I69959 Hemiplegia and hemiparesis following unspecified cerebrovascular disease affecting unspecified side: Secondary | ICD-10-CM

## 2012-12-15 DIAGNOSIS — F32A Depression, unspecified: Secondary | ICD-10-CM

## 2012-12-15 DIAGNOSIS — F329 Major depressive disorder, single episode, unspecified: Secondary | ICD-10-CM

## 2012-12-15 DIAGNOSIS — E119 Type 2 diabetes mellitus without complications: Secondary | ICD-10-CM

## 2012-12-15 DIAGNOSIS — N183 Chronic kidney disease, stage 3 unspecified: Secondary | ICD-10-CM

## 2012-12-15 DIAGNOSIS — J449 Chronic obstructive pulmonary disease, unspecified: Secondary | ICD-10-CM

## 2012-12-15 DIAGNOSIS — J4489 Other specified chronic obstructive pulmonary disease: Secondary | ICD-10-CM

## 2012-12-15 DIAGNOSIS — F3289 Other specified depressive episodes: Secondary | ICD-10-CM

## 2012-12-15 DIAGNOSIS — I69359 Hemiplegia and hemiparesis following cerebral infarction affecting unspecified side: Secondary | ICD-10-CM

## 2012-12-15 DIAGNOSIS — I69320 Aphasia following cerebral infarction: Secondary | ICD-10-CM | POA: Insufficient documentation

## 2012-12-15 DIAGNOSIS — N201 Calculus of ureter: Secondary | ICD-10-CM

## 2012-12-15 DIAGNOSIS — I69920 Aphasia following unspecified cerebrovascular disease: Secondary | ICD-10-CM

## 2012-12-15 NOTE — Progress Notes (Signed)
Patient ID: Justin Sherman, male   DOB: [birth year], 72 y.o.   MRN: [id number] Location: [facility] Provider:  Gwenith Spitz. Renato Gails, D.O., C.M.D.  Code Status:  Full code    Chief Complaint  Patient presents with  . Medical Managment of Chronic Issues    HPI:  72 yo white male with h/o stroke with hemiparesis and aphasia, depression, iron deficiency anemia, diabetes mellitus II with neurologic complication, nephrolithiasis, chronic pain seen for medical mgt chronic conditions.  He had no complaints.  He said he feels good.  He is on oxygen for his COPD.  He has panic attacks occasionally requiring ativan.  Staff have not had any recent concerns about him.    Review of Systems:  Review of Systems  Constitutional: Negative for weight loss.  HENT: Negative for congestion.   Eyes: Negative for blurred vision.  Respiratory: Positive for cough and shortness of breath.   Cardiovascular: Negative for chest pain and palpitations.  Gastrointestinal: Negative for heartburn and constipation.  Genitourinary: Negative for dysuria.  Musculoskeletal: Negative for back pain, joint pain and falls.  Skin: Negative for rash.  Neurological: Positive for focal weakness.  Endo/Heme/Allergies: Does not bruise/bleed easily.  Psychiatric/Behavioral: Positive for memory loss. Negative for depression.    Medications: Patient's Medications  New Prescriptions   No medications on file  Previous Medications   ALBUTEROL (PROVENTIL) (2.5 MG/3ML) 0.083% NEBULIZER SOLUTION    Take 2.5 mg by nebulization every 2 (two) hours as needed for wheezing.   ASPIRIN EC 81 MG TABLET    Take 81 mg by mouth daily.   CITALOPRAM (CELEXA) 20 MG TABLET    Take 20 mg by mouth at bedtime.    FERROUS SULFATE 325 (65 FE) MG TABLET    Take 325 mg by mouth daily with breakfast.    FLUTICASONE-SALMETEROL (ADVAIR) 250-50 MCG/DOSE AEPB    Inhale 1 puff into the lungs 2 (two) times daily.    GUAIFENESIN (MUCINEX) 600 MG 12 HR  TABLET    Take 600 mg by mouth 2 (two) times daily.    INSULIN ASPART (NOVOLOG) 100 UNIT/ML INJECTION    Inject 5 Units into the skin 4 (four) times daily -  before meals and at bedtime. Give for CBG > 150. Before meals and at bedtime   INSULIN GLARGINE (LANTUS) 100 UNIT/ML INJECTION    Inject 5 Units into the skin at bedtime.    IPRATROPIUM-ALBUTEROL (DUONEB) 0.5-2.5 (3) MG/3ML SOLN    Take 3 mLs by nebulization every 6 (six) hours as needed. For shortness of breath   LORAZEPAM (ATIVAN) 0.5 MG TABLET    Take 1/2 tablet once a day as needed   MULTIPLE VITAMINS-MINERALS (MULTIVITAMINS THER. W/MINERALS) TABS    Take 1 tablet by mouth daily.    OXYCODONE (OXY IR/ROXICODONE) 5 MG IMMEDIATE RELEASE TABLET    Take 2 tablets (10 mg total) by mouth every 4 (four) hours as needed for pain. For pain   POLYETHYLENE GLYCOL (MIRALAX / GLYCOLAX) PACKET    Take 17 g by mouth daily.   RANITIDINE (ZANTAC) 75 MG TABLET    Take 75 mg by mouth daily as needed for heartburn.    SIMVASTATIN (ZOCOR) 40 MG TABLET    Take 40 mg by mouth at bedtime.    THIAMINE 100 MG TABLET    Take 100 mg by mouth every morning.   Modified Medications   No medications on file  Discontinued Medications   OXYCODONE HCL 10 MG TABS  Take one tablet every 4 hours as needed for pain    Physical Exam: Filed Vitals:   12/10/12 1939  BP: 141/66  Pulse: 61  Temp: 98.3 F (36.8 C)  Resp: 19  Physical Exam  Constitutional: He appears well-developed and well-nourished. No distress.  HENT:  Head: Normocephalic and atraumatic.  Eyes: EOM are normal. Pupils are equal, round, and reactive to light.  Neck: Normal range of motion.  Cardiovascular: Normal rate, regular rhythm, normal heart sounds and intact distal pulses.   Pulmonary/Chest: Effort normal and breath sounds normal.  Abdominal: Soft. Bowel sounds are normal. He exhibits no distension. There is no tenderness.  Neurological: He is alert.  Hemiparetic and aphasic--sometimes  answers appropriately--mostly yes or no  Skin: Skin is warm and dry.    Labs reviewed: Basic Metabolic Panel:  Recent Labs  16/10/96 0140 08/19/12 0425 08/20/12 0410  NA 135 137 136  K 4.3 4.1 4.2  CL 99 100 100  CO2 28 31 30   GLUCOSE 137* 130* 114*  BUN 29* 30* 30*  CREATININE 2.22* 2.35* 2.02*  CALCIUM 9.5 9.3 9.4    Liver Function Tests:  Recent Labs  08/17/12 1000  AST 11  ALT 7  ALKPHOS 76  BILITOT 0.5  PROT 7.1  ALBUMIN 3.1*   CBC:  Recent Labs  08/17/12 1000 08/18/12 0140 08/19/12 0425 08/20/12 0410  WBC 20.5* 12.7* 10.9* 8.9  NEUTROABS 17.5*  --   --   --   HGB 10.9* 10.6* 9.8* 9.5*  HCT 34.3* 33.7* 31.9* 30.7*  MCV 85.1 86.2 86.9 86.2  PLT 165 136* 129* 142*   09/11/12:  hba1c 6.3 11/12/12:  CXR:  Poor inspiration, minimal atelectasis 11/17/12:  Wbc 11.5, h/h 9.4/29.8, plts 178, Na 138, K 4.4, BUn 42, cr 2.17 (stable), glucose 109  Assessment/Plan 1. COPD, moderate -no recent exacerbations-doing well with advair, albuterol, duonebs; prn ativan for panic attacks with dyspnea and ? Of vocal cord dysfunction  2. Type II or unspecified type diabetes mellitus without mention of complication, not stated as uncontrolled -controlled with last hba1c 6.3, requiring only low dose lantus at this point  3. 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 CKD (chronic kidney disease) stage 3, GFR 30-59 ml/min -recently stable, has h/o ureteral stents due to frequent stones  5. Ureteral calculi -has stents and family has decided not to put him through any additional surgeries for this  6. Depression -continue celexa and lorazepam   Family/ staff Communication: discussed with nursing staff Goals of care: is full code--I had a discussion with his brother that lives locally and he would  favor dnr status, but his brother who lives in [place] apparently has a different perspective  Labs/tests ordered:  Hba1c, bmp next draw

## 2013-01-15 ENCOUNTER — Non-Acute Institutional Stay (SKILLED_NURSING_FACILITY): Payer: Medicare Other | Admitting: Internal Medicine

## 2013-01-15 DIAGNOSIS — N183 Chronic kidney disease, stage 3 unspecified: Secondary | ICD-10-CM

## 2013-01-15 DIAGNOSIS — F329 Major depressive disorder, single episode, unspecified: Secondary | ICD-10-CM

## 2013-01-15 DIAGNOSIS — J4489 Other specified chronic obstructive pulmonary disease: Secondary | ICD-10-CM

## 2013-01-15 DIAGNOSIS — I69959 Hemiplegia and hemiparesis following unspecified cerebrovascular disease affecting unspecified side: Secondary | ICD-10-CM

## 2013-01-15 DIAGNOSIS — I69320 Aphasia following cerebral infarction: Secondary | ICD-10-CM

## 2013-01-15 DIAGNOSIS — N201 Calculus of ureter: Secondary | ICD-10-CM

## 2013-01-15 DIAGNOSIS — F3289 Other specified depressive episodes: Secondary | ICD-10-CM

## 2013-01-15 DIAGNOSIS — E119 Type 2 diabetes mellitus without complications: Secondary | ICD-10-CM

## 2013-01-15 DIAGNOSIS — I69359 Hemiplegia and hemiparesis following cerebral infarction affecting unspecified side: Secondary | ICD-10-CM

## 2013-01-15 DIAGNOSIS — F32A Depression, unspecified: Secondary | ICD-10-CM

## 2013-01-15 DIAGNOSIS — J449 Chronic obstructive pulmonary disease, unspecified: Secondary | ICD-10-CM

## 2013-01-15 DIAGNOSIS — I69920 Aphasia following unspecified cerebrovascular disease: Secondary | ICD-10-CM

## 2013-01-18 ENCOUNTER — Encounter: Payer: Self-pay | Admitting: Internal Medicine

## 2013-01-18 NOTE — Progress Notes (Signed)
Patient ID: Justin Sherman, male   DOB: 1940-08-06, 72 y.o.   MRN: [id number] Location:  [facility] Provider:  Gwenith Spitz. Renato Gails, D.O., C.M.D.  Code Status: full code   Chief Complaint  Patient presents with  . Medical Managment of Chronic Issues   HPI:  72 yo white male here for long term care since his stroke with hemiparesis and aphasia.  He also has had difficulty with anxiety and kidney stones.  Lately, he's been doing well.  He had no complaints when seen.  He was sitting up in his wheelchair after the morning activity and lunch.    Review of Systems:  Review of Systems  Constitutional: Negative for fever, chills and malaise/fatigue.  Eyes: Negative for blurred vision.  Respiratory: Negative for shortness of breath and wheezing.        On chronic O2 via [place] for COPD  Cardiovascular: Negative for chest pain.  Gastrointestinal: Negative for heartburn and constipation.  Genitourinary: Negative for dysuria.  Musculoskeletal: Negative for myalgias.  Neurological: Positive for focal weakness. Negative for dizziness and headaches.  Psychiatric/Behavioral: Positive for memory loss. Negative for depression.    Medications: Patient's Medications  New Prescriptions   No medications on file  Previous Medications   ALBUTEROL (PROVENTIL) (2.5 MG/3ML) 0.083% NEBULIZER SOLUTION    Take 2.5 mg by nebulization every 2 (two) hours as needed for wheezing.   ASPIRIN EC 81 MG TABLET    Take 81 mg by mouth daily.   CITALOPRAM (CELEXA) 20 MG TABLET    Take 20 mg by mouth at bedtime.    FERROUS SULFATE 325 (65 FE) MG TABLET    Take 325 mg by mouth daily with breakfast.    FLUTICASONE-SALMETEROL (ADVAIR) 250-50 MCG/DOSE AEPB    Inhale 1 puff into the lungs 2 (two) times daily.    GUAIFENESIN (MUCINEX) 600 MG 12 HR TABLET    Take 600 mg by mouth 2 (two) times daily.    INSULIN ASPART (NOVOLOG) 100 UNIT/ML INJECTION    Inject 5 Units into the skin 4 (four) times daily -  before meals and  at bedtime. Give for CBG > 150. Before meals and at bedtime   INSULIN GLARGINE (LANTUS) 100 UNIT/ML INJECTION    Inject 5 Units into the skin at bedtime.    IPRATROPIUM-ALBUTEROL (DUONEB) 0.5-2.5 (3) MG/3ML SOLN    Take 3 mLs by nebulization every 6 (six) hours as needed. For shortness of breath   LORAZEPAM (ATIVAN) 0.5 MG TABLET    Take 1/2 tablet once a day as needed   MULTIPLE VITAMINS-MINERALS (MULTIVITAMINS THER. W/MINERALS) TABS    Take 1 tablet by mouth daily.    OXYCODONE (OXY IR/ROXICODONE) 5 MG IMMEDIATE RELEASE TABLET    Take 2 tablets (10 mg total) by mouth every 4 (four) hours as needed for pain. For pain   POLYETHYLENE GLYCOL (MIRALAX / GLYCOLAX) PACKET    Take 17 g by mouth daily.   RANITIDINE (ZANTAC) 75 MG TABLET    Take 75 mg by mouth daily as needed for heartburn.    SIMVASTATIN (ZOCOR) 40 MG TABLET    Take 40 mg by mouth at bedtime.    THIAMINE 100 MG TABLET    Take 100 mg by mouth every morning.   Modified Medications   No medications on file  Discontinued Medications   No medications on file    Physical Exam: Physical Exam  Constitutional: He appears well-developed and well-nourished. No distress.  HENT:  Head:  Normocephalic and atraumatic.  Eyes: EOM are normal. Pupils are equal, round, and reactive to light.  Cardiovascular: Regular rhythm.  Exam reveals no gallop and no friction rub.   No murmur heard. Pulmonary/Chest: Effort normal and breath sounds normal. No respiratory distress. He has no wheezes.  Abdominal: Soft. Bowel sounds are normal. He exhibits no distension. There is no tenderness.  Musculoskeletal: He exhibits no edema and no tenderness.  hemiparetic  Neurological: He is alert.     Labs reviewed: Basic Metabolic Panel:  Recent Labs  16/10/96 0140 08/19/12 0425 08/20/12 0410  NA 135 137 136  K 4.3 4.1 4.2  CL 99 100 100  CO2 28 31 30   GLUCOSE 137* 130* 114*  BUN 29* 30* 30*  CREATININE 2.22* 2.35* 2.02*  CALCIUM 9.5 9.3 9.4     Liver Function Tests:  Recent Labs  08/17/12 1000  AST 11  ALT 7  ALKPHOS 76  BILITOT 0.5  PROT 7.1  ALBUMIN 3.1*    CBC:  Recent Labs  08/17/12 1000 08/18/12 0140 08/19/12 0425 08/20/12 0410  WBC 20.5* 12.7* 10.9* 8.9  NEUTROABS 17.5*  --   --   --   HGB 10.9* 10.6* 9.8* 9.5*  HCT 34.3* 33.7* 31.9* 30.7*  MCV 85.1 86.2 86.9 86.2  PLT 165 136* 129* 142*   Lab Results  Component Value Date   HGBA1C 6.1* 08/17/2012    Assessment/Plan 1. COPD, moderate -no recent flares -cont current therapy and [place] O2  2. Type II or unspecified type diabetes mellitus without mention of complication, not stated as uncontrolled -has been well controlled, no changes needed  3. CKD (chronic kidney disease) stage 3, GFR 30-59 ml/min -has been stable recently, continue to monitor -maintain adequate hydration   4. 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 Depression -mood appears generally good for some time now with current therapy  6. Ureteral calculi -his family has elected to avoid any invasive procedures to remove stents at this point -he remains full code due to requests of a brother who lives in [place] though remaining family agree with DNR status  Family/ staff Communication: staff had no new concerns Goals of care: full code

## 2013-03-09 ENCOUNTER — Encounter: Payer: Self-pay | Admitting: Nurse Practitioner

## 2013-03-09 ENCOUNTER — Non-Acute Institutional Stay (SKILLED_NURSING_FACILITY): Payer: Medicare Other | Admitting: Nurse Practitioner

## 2013-03-09 DIAGNOSIS — E119 Type 2 diabetes mellitus without complications: Secondary | ICD-10-CM

## 2013-03-09 DIAGNOSIS — N189 Chronic kidney disease, unspecified: Secondary | ICD-10-CM

## 2013-03-09 DIAGNOSIS — D631 Anemia in chronic kidney disease: Secondary | ICD-10-CM | POA: Insufficient documentation

## 2013-03-09 DIAGNOSIS — I69320 Aphasia following cerebral infarction: Secondary | ICD-10-CM

## 2013-03-09 DIAGNOSIS — J449 Chronic obstructive pulmonary disease, unspecified: Secondary | ICD-10-CM

## 2013-03-09 DIAGNOSIS — I69959 Hemiplegia and hemiparesis following unspecified cerebrovascular disease affecting unspecified side: Secondary | ICD-10-CM

## 2013-03-09 DIAGNOSIS — I69920 Aphasia following unspecified cerebrovascular disease: Secondary | ICD-10-CM

## 2013-03-09 NOTE — Progress Notes (Signed)
Patient ID: Justin Sherman, male   DOB: 04/06/1941, 72 y.o.   MRN: [id number]    Nursing Home Location:  [facility]   Place of Service: SNF (31)  PCP: REED, TIFFANY, DO  No Known Allergies  Chief Complaint  Patient presents with  . Medical Managment of Chronic Issues    HPI:  72 yo male with h/o COPD with frequent exacerbations, anxiety, controlled DMII, prior stroke with hemiparesis and aphasia, depression, hyperlipidemia and ureteral stones  Pt currently doing well; staff has no concerns ROS and HPI limited due to aphasia but pt is able to answer yes and no  Review of Systems:  Review of Systems  Constitutional: Negative for fever, chills and malaise/fatigue.  Respiratory: Negative for cough, shortness of breath and wheezing.        On chronic O2 via [place] for COPD  Cardiovascular: Negative for chest pain and leg swelling.  Gastrointestinal: Negative for heartburn and constipation.  Genitourinary: Negative for dysuria.  Musculoskeletal: Negative for myalgias.  Skin: Negative.   Neurological: Positive for focal weakness. Negative for dizziness.  Psychiatric/Behavioral: Positive for memory loss. Negative for depression.     Past Medical History  Diagnosis Date  . 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 Acute conjunctivitis, unspecified   . Aortic aneurysm of unspecified site without mention of rupture   . Flaccid hemiplegia affecting dominant side   . Type II or unspecified type diabetes mellitus without mention of complication, not stated as uncontrolled   . Depressive disorder, not elsewhere classified   . Unspecified essential hypertension   . Obstructive chronic bronchitis with exacerbation   . Anxiety state, unspecified   . Esophageal reflux   . Other and unspecified hyperlipidemia   . Pressure ulcer, other site(707.09)   . Dementia   . Chronic kidney disease 08/04/2011    Acute renal failure per note  08/04/2011-Dr. Art Chilton Si  . Stroke    Past Surgical History  Procedure Laterality Date  . Cystoscopy w/ ureteral stent placement  08/07/2011    Procedure: CYSTOSCOPY WITH RETROGRADE PYELOGRAM/URETERAL STENT PLACEMENT;  Surgeon: Kathi Ludwig, MD;  Location: [facility];  Service: Urology;  Laterality: Bilateral;  bilateral stent placement ureterosopy  . Cystoscopy/retrograde/ureteroscopy  12/07/2011    Procedure: CYSTOSCOPY/RETROGRADE/URETEROSCOPY;  Surgeon: Kathi Ludwig, MD;  Location: [facility];  Service: Urology;  Laterality: Bilateral;  . Cystoscopy w/ ureteral stent placement  12/07/2011    Procedure: CYSTOSCOPY WITH STENT REPLACEMENT;  Surgeon: Kathi Ludwig, MD;  Location: [facility];  Service: Urology;  Laterality: Bilateral;  (BIL) JJ STENT EXCHANGE  . Cystoscopy with urethral dilatation  12/07/2011    Procedure: CYSTOSCOPY WITH URETHRAL DILATATION;  Surgeon: Kathi Ludwig, MD;  Location: [facility];  Service: Urology;;   Social History:   reports that he has never smoked. He does not have any smokeless tobacco history on file. He reports that he does not drink alcohol or use illicit drugs.  No family history on file.  Medications: Patient's Medications  New Prescriptions   No medications on file  Previous Medications   ALBUTEROL (PROVENTIL) (2.5 MG/3ML) 0.083% NEBULIZER SOLUTION    Take 2.5 mg by nebulization every 2 (two) hours as needed for wheezing.   ASPIRIN EC 81 MG TABLET    Take 81 mg by mouth daily.   CITALOPRAM (CELEXA) 20 MG TABLET    Take 20 mg by mouth at bedtime.    FERROUS SULFATE 325 (65 FE) MG  TABLET    Take 325 mg by mouth daily with breakfast.    FLUTICASONE-SALMETEROL (ADVAIR) 250-50 MCG/DOSE AEPB    Inhale 1 puff into the lungs 2 (two) times daily.    GUAIFENESIN (MUCINEX) 600 MG 12 HR TABLET    Take 600 mg by mouth 2 (two) times daily.    INSULIN ASPART (NOVOLOG) 100 UNIT/ML INJECTION    Inject 5 Units into the skin 4 (four) times daily -  before  meals and at bedtime. Give for CBG > 150. Before meals and at bedtime   INSULIN GLARGINE (LANTUS) 100 UNIT/ML INJECTION    Inject 5 Units into the skin at bedtime.    IPRATROPIUM-ALBUTEROL (DUONEB) 0.5-2.5 (3) MG/3ML SOLN    Take 3 mLs by nebulization every 6 (six) hours as needed. For shortness of breath   LORAZEPAM (ATIVAN) 0.5 MG TABLET    Take 1/2 tablet once a day as needed   MULTIPLE VITAMINS-MINERALS (MULTIVITAMINS THER. W/MINERALS) TABS    Take 1 tablet by mouth daily.    OXYCODONE (OXY IR/ROXICODONE) 5 MG IMMEDIATE RELEASE TABLET    Take 2 tablets (10 mg total) by mouth every 4 (four) hours as needed for pain. For pain   POLYETHYLENE GLYCOL (MIRALAX / GLYCOLAX) PACKET    Take 17 g by mouth daily.   RANITIDINE (ZANTAC) 75 MG TABLET    Take 75 mg by mouth daily as needed for heartburn.    SIMVASTATIN (ZOCOR) 40 MG TABLET    Take 40 mg by mouth at bedtime.    THIAMINE 100 MG TABLET    Take 100 mg by mouth every morning.   Modified Medications   No medications on file  Discontinued Medications   No medications on file     Physical Exam:  Filed Vitals:   03/09/13 1502  BP: 132/76  Pulse: 75  Temp: 98.5 F (36.9 C)  Resp: 20   Physical Exam  Constitutional: He is well-developed, well-nourished, and in no distress. No distress.  HENT:  Head: Normocephalic and atraumatic.  Mouth/Throat: Oropharynx is clear and moist. No oropharyngeal exudate.  Neck: Normal range of motion. Neck supple.  Cardiovascular: Normal rate, regular rhythm and normal heart sounds.   Pulmonary/Chest: Effort normal and breath sounds normal.  Abdominal: Soft. Bowel sounds are normal.  Musculoskeletal: He exhibits no edema and no tenderness.  Right sided weakness due to CVA  Neurological: He is alert.  Skin: Skin is warm and dry. He is not diaphoretic.      Labs reviewed: Basic Metabolic Panel:  Recent Labs  16/10/96 0140 08/19/12 0425 08/20/12 0410  NA 135 137 136  K 4.3 4.1 4.2  CL 99 100  100  CO2 28 31 30   GLUCOSE 137* 130* 114*  BUN 29* 30* 30*  CREATININE 2.22* 2.35* 2.02*  CALCIUM 9.5 9.3 9.4   Liver Function Tests:  Recent Labs  08/17/12 1000  AST 11  ALT 7  ALKPHOS 76  BILITOT 0.5  PROT 7.1  ALBUMIN 3.1*   No results found for this basename: LIPASE, AMYLASE,  in the last 8760 hours No results found for this basename: AMMONIA,  in the last 8760 hours CBC:  Recent Labs  08/17/12 1000 08/18/12 0140 08/19/12 0425 08/20/12 0410  WBC 20.5* 12.7* 10.9* 8.9  NEUTROABS 17.5*  --   --   --   HGB 10.9* 10.6* 9.8* 9.5*  HCT 34.3* 33.7* 31.9* 30.7*  MCV 85.1 86.2 86.9 86.2  PLT 165 136* 129* 142*  CBC with Diff    Result: 11/17/2012 11:13 AM   ( Status: F )     C WBC 11.5   H 4.0-10.5 K/uL SLN   RBC 3.46   L 4.22-5.81 MIL/uL SLN   Hemoglobin 9.4   L 13.0-17.0 g/dL SLN   Hematocrit 16.1   L 39.0-52.0 % SLN   MCV 86.1     78.0-100.0 fL SLN   MCH 27.2     26.0-34.0 pg SLN   MCHC 31.5     30.0-36.0 g/dL SLN   RDW 09.6     04.5-40.9 % SLN   Platelet Count 178     150-400 K/uL SLN   Granulocyte % 66     43-77 % SLN   Absolute Gran 7.6     1.7-7.7 K/uL SLN   Lymph % 18     12-46 % SLN   Absolute Lymph 2.0     0.7-4.0 K/uL SLN   Mono % 8     3-12 % SLN   Absolute Mono 1.0     0.1-1.0 K/uL SLN   Eos % 8   H 0-5 % SLN   Absolute Eos 0.9   H 0.0-0.7 K/uL SLN   Baso % 0     0-1 % SLN   Absolute Baso 0.0     0.0-0.1 K/uL SLN   Smear Review Criteria for review not met  SLN   Basic Metabolic Panel    Result: 11/17/2012 11:47 AM   ( Status: F )       Sodium 138     135-145 mEq/L SLN   Potassium 4.4     3.5-5.3 mEq/L SLN   Chloride 105     96-112 mEq/L SLN   CO2 28     19-32 mEq/L SLN   Glucose 109   H 70-99 mg/dL SLN   BUN 42   H 8-11 mg/dL SLN   Creatinine 9.14   H 0.50-1.35 mg/dL SLN   Calcium 9.4  Assessment/Plan 1. 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 Type II or unspecified type diabetes  mellitus without mention of complication, not stated as uncontrolled -well controlled; will follow up A1c at this time  3. COPD, moderate -no flares, on chronic O2; does well on current medications; will cont current regimen   4. CKD (chronic kidney disease) stage 3, GFR 30-59 ml/min -will follow up CMP  5. Anemia in CKD (chronic kidney disease) -follow up CBC at this time

## 2013-05-15 ENCOUNTER — Encounter: Payer: Self-pay | Admitting: Internal Medicine

## 2013-05-15 ENCOUNTER — Non-Acute Institutional Stay (SKILLED_NURSING_FACILITY): Payer: Medicare Other | Admitting: Internal Medicine

## 2013-05-15 DIAGNOSIS — D631 Anemia in chronic kidney disease: Secondary | ICD-10-CM

## 2013-05-15 DIAGNOSIS — E119 Type 2 diabetes mellitus without complications: Secondary | ICD-10-CM

## 2013-05-15 DIAGNOSIS — N189 Chronic kidney disease, unspecified: Secondary | ICD-10-CM

## 2013-05-15 DIAGNOSIS — E785 Hyperlipidemia, unspecified: Secondary | ICD-10-CM

## 2013-05-15 DIAGNOSIS — N183 Chronic kidney disease, stage 3 unspecified: Secondary | ICD-10-CM

## 2013-05-15 DIAGNOSIS — J449 Chronic obstructive pulmonary disease, unspecified: Secondary | ICD-10-CM

## 2013-05-15 DIAGNOSIS — N039 Chronic nephritic syndrome with unspecified morphologic changes: Secondary | ICD-10-CM

## 2013-05-15 NOTE — Assessment & Plan Note (Signed)
Most recent H/H 03/2013 was 10.4/32.5  PLT 224; improved from prior; pt on thiamine

## 2013-05-15 NOTE — Progress Notes (Signed)
MRN: [id number] Name: Justin Sherman  Sex: male Age: 73 y.o. DOB: 1940/07/11  PSC #: Ronni Rumble Facility/Room:  217B Level Of Care: SNF Provider: Merrilee Seashore D Emergency Contacts: Extended Emergency Contact Information Primary Emergency Contact: Trashawn, [place], [place] [address] [place] Home Phone: [phone] Relation: Son  Code Status: FULL  Allergies: Review of patient's allergies indicates no known allergies.  Chief Complaint  Patient presents with  . Medical Managment of Chronic Issues    HPI: Patient is 73 y.o. male who is being seen today for routine medical problems.   Past Medical History  Diagnosis Date  . 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 Acute conjunctivitis, unspecified   . Aortic aneurysm of unspecified site without mention of rupture   . Flaccid hemiplegia affecting dominant side   . Type II or unspecified type diabetes mellitus without mention of complication, not stated as uncontrolled   . Depressive disorder, not elsewhere classified   . Unspecified essential hypertension   . Obstructive chronic bronchitis with exacerbation   . Anxiety state, unspecified   . Esophageal reflux   . Other and unspecified hyperlipidemia   . Pressure ulcer, other site(707.09)   . Dementia   . Chronic kidney disease 08/04/2011    Acute renal failure per note 08/04/2011-Dr. Art Chilton Si  . Stroke     Past Surgical History  Procedure Laterality Date  . Cystoscopy w/ ureteral stent placement  08/07/2011    Procedure: CYSTOSCOPY WITH RETROGRADE PYELOGRAM/URETERAL STENT PLACEMENT;  Surgeon: Kathi Ludwig, MD;  Location: [facility];  Service: Urology;  Laterality: Bilateral;  bilateral stent placement ureterosopy  . Cystoscopy/retrograde/ureteroscopy  12/07/2011    Procedure: CYSTOSCOPY/RETROGRADE/URETEROSCOPY;  Surgeon: Kathi Ludwig, MD;  Location: [facility];  Service: Urology;  Laterality:  Bilateral;  . Cystoscopy w/ ureteral stent placement  12/07/2011    Procedure: CYSTOSCOPY WITH STENT REPLACEMENT;  Surgeon: Kathi Ludwig, MD;  Location: [facility];  Service: Urology;  Laterality: Bilateral;  (BIL) JJ STENT EXCHANGE  . Cystoscopy with urethral dilatation  12/07/2011    Procedure: CYSTOSCOPY WITH URETHRAL DILATATION;  Surgeon: Kathi Ludwig, MD;  Location: [facility];  Service: Urology;;      Medication List       This list is accurate as of: 05/15/13 11:59 PM.  Always use your most recent med list.               albuterol (2.5 MG/3ML) 0.083% nebulizer solution  Commonly known as:  PROVENTIL  Take 2.5 mg by nebulization every 2 (two) hours as needed for wheezing.     aspirin EC 81 MG tablet  Take 81 mg by mouth daily.     citalopram 20 MG tablet  Commonly known as:  CELEXA  Take 20 mg by mouth at bedtime.     ferrous sulfate 325 (65 FE) MG tablet  Take 325 mg by mouth daily with breakfast.     Fluticasone-Salmeterol 250-50 MCG/DOSE Aepb  Commonly known as:  ADVAIR  Inhale 1 puff into the lungs 2 (two) times daily.     guaiFENesin 600 MG 12 hr tablet  Commonly known as:  MUCINEX  Take 600 mg by mouth 2 (two) times daily.     insulin aspart 100 UNIT/ML injection  Commonly known as:  novoLOG  - Inject 5 Units into the skin 4 (four) times daily -  before meals and at bedtime. Give for  CBG > 150.  - Before meals and at bedtime     insulin glargine 100 UNIT/ML injection  Commonly known as:  LANTUS  Inject 5 Units into the skin at bedtime.     ipratropium-albuterol 0.5-2.5 (3) MG/3ML Soln  Commonly known as:  DUONEB  Take 3 mLs by nebulization every 6 (six) hours as needed. For shortness of breath     LORazepam 0.5 MG tablet  Commonly known as:  ATIVAN  Take 1/2 tablet once a day as needed     multivitamins ther. w/minerals Tabs tablet  Take 1 tablet by mouth daily.     oxyCODONE 5 MG immediate release tablet  Commonly known as:  Oxy  IR/ROXICODONE  Take 2 tablets (10 mg total) by mouth every 4 (four) hours as needed for pain. For pain     polyethylene glycol packet  Commonly known as:  MIRALAX / GLYCOLAX  Take 17 g by mouth daily.     ranitidine 75 MG tablet  Commonly known as:  ZANTAC  Take 75 mg by mouth daily as needed for heartburn.     simvastatin 40 MG tablet  Commonly known as:  ZOCOR  Take 40 mg by mouth at bedtime.     thiamine 100 MG tablet  Take 100 mg by mouth every morning.        No orders of the defined types were placed in this encounter.    Immunization History  Administered Date(s) Administered  . Influenza Whole 01/28/2013  . Pneumococcal-Unspecified 04/30/2011    History  Substance Use Topics  . Smoking status: Never Smoker   . Smokeless tobacco: Not on file  . Alcohol Use: No    Review of Systems  DATA OBTAINED: from patient;  GENERAL: Feels well no fevers, fatigue, appetite changes SKIN: No itching, rash HEENT: No complaint RESPIRATORY: No cough, +wheezing, no SOB CARDIAC: No chest pain, palpitations, lower extremity edema  GI: No abdominal pain, No N/V/D or constipation, No heartburn or reflux  GU: No dysuria, frequency or urgency, or incontinence  MUSCULOSKELETAL: No unrelieved bone/joint pain NEUROLOGIC: No headache, dizziness or focal weakness PSYCHIATRIC: No overt anxiety or sadness. Sleeps well.   Filed Vitals:   05/15/13 1140  BP: 136/90  Pulse: 72  Temp: 98.3 F (36.8 C)  Resp: 16    Physical Exam  GENERAL APPEARANCE: Alert, conversant. Appropriately groomed. No acute distress  SKIN: No diaphoresis rash, or wounds HEENT: Unremarkable RESPIRATORY: Breathing is even, unlabored. Lung sounds with decent airflow but wheezing throuout  O2 sat at bedside per ADAMD was 97% on 1 L with a pulse 67 CARDIOVASCULAR: Heart RRR no murmurs, rubs or gallops. No peripheral edema  GASTROINTESTINAL: Abdomen is soft, non-tender, not distended w/ normal bowel sounds.   GENITOURINARY: Bladder non tender, not distended  MUSCULOSKELETAL: No abnormal joints or musculature NEUROLOGIC: Cranial nerves 2-12 grossly intact. Moves all extremities no tremor. PSYCHIATRIC: Mood and affect appropriate to situation, no behavioral issues  Patient Active Problem List   Diagnosis Date Noted  . Anemia in CKD (chronic kidney disease) 03/09/2013  . 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 Normocytic anemia 08/18/2012  . Other and unspecified hyperlipidemia 08/18/2012  . COPD, moderate 08/18/2012  . Depression 08/18/2012  . Sepsis 08/18/2012  . PICC line infection 08/17/2012  . CKD (chronic kidney disease) stage 3, GFR 30-59 ml/min 08/17/2012  . UTI (lower urinary tract infection) 08/17/2012  . Ureteral calculi 07/06/2012  . Sepsis 08/04/2011  .  ARF (acute renal failure) 08/04/2011  . Urinary retention 08/04/2011  . Fever 08/04/2011  . Leukocytosis 08/04/2011  . Urethral stricture 08/04/2011  . Obstructive chronic bronchitis with exacerbation   . Type II or unspecified type diabetes mellitus without mention of complication, not stated as uncontrolled   . Dementia   . Flaccid hemiplegia affecting dominant side     CBC    Component Value Date/Time   WBC 8.9 08/20/2012 0410   RBC 3.56* 08/20/2012 0410   HGB 9.5* 08/20/2012 0410   HCT 30.7* 08/20/2012 0410   PLT 142* 08/20/2012 0410   MCV 86.2 08/20/2012 0410   LYMPHSABS 1.3 08/17/2012 1000   MONOABS 1.3* 08/17/2012 1000   EOSABS 0.3 08/17/2012 1000   BASOSABS 0.1 08/17/2012 1000    CMP     Component Value Date/Time   NA 136 08/20/2012 0410   K 4.2 08/20/2012 0410   CL 100 08/20/2012 0410   CO2 30 08/20/2012 0410   GLUCOSE 114* 08/20/2012 0410   BUN 30* 08/20/2012 0410   CREATININE 2.02* 08/20/2012 0410   CALCIUM 9.4 08/20/2012 0410   PROT 7.1 08/17/2012 1000   ALBUMIN 3.1* 08/17/2012 1000   AST 11 08/17/2012 1000   ALT 7 08/17/2012 1000   ALKPHOS 76 08/17/2012 1000   BILITOT  0.5 08/17/2012 1000   GFRNONAA 31* 08/20/2012 0410   GFRAA 36* 08/20/2012 0410    Assessment and Plan  COPD, moderate Pt is wheezing today but very comfortable with it so don't think exacerbation, just not well controlled on current Advair dose of 250-50. Increase to advair to 500-50. Will schedule duoneb BID for a week; he has it prn but doesn't get it  Type II or unspecified type diabetes mellitus without mention of complication, not stated as uncontrolled HbA1c 03/2013 was 7.7; will increase Lantus to 8 units at bedtime and continue novolog for CBG> 150 for better control; HbA1c , FLP on 4/1  Other and unspecified hyperlipidemia On Zocor 40 mg;; last FLP was 02/2012 with HDL30 , LDL 71;will order FLP.  Anemia in CKD (chronic kidney disease) Most recent H/H 03/2013 was 10.4/32.5  PLT 224; improved from prior; pt on thiamine  CKD (chronic kidney disease) stage 3, GFR 30-59 ml/min Most recent BUN 42/2.17 in 11/3012, stable from prior; will order another 07/2013    [name], Sharlene Mccluskey D, MD

## 2013-05-15 NOTE — Assessment & Plan Note (Signed)
On Zocor 40 mg;; last FLP was 02/2012 with HDL30 , LDL 71;will order FLP.

## 2013-05-15 NOTE — Assessment & Plan Note (Addendum)
Pt is wheezing today but very comfortable with it so don't think exacerbation, just not well controlled on current Advair dose of 250-50. Increase to advair to 500-50. Will schedule duoneb BID for a week; he has it prn but doesn't get it

## 2013-05-15 NOTE — Assessment & Plan Note (Signed)
HbA1c 03/2013 was 7.7; will increase Lantus to 8 units at bedtime and continue novolog for CBG> 150 for better control; HbA1c , FLP on 4/1

## 2013-05-15 NOTE — Assessment & Plan Note (Signed)
Most recent BUN 42/2.17 in 11/3012, stable from prior; will order another 07/2013

## 2013-07-06 ENCOUNTER — Non-Acute Institutional Stay (SKILLED_NURSING_FACILITY): Payer: Medicare Other | Admitting: Nurse Practitioner

## 2013-07-06 DIAGNOSIS — N183 Chronic kidney disease, stage 3 unspecified: Secondary | ICD-10-CM

## 2013-07-06 DIAGNOSIS — N039 Chronic nephritic syndrome with unspecified morphologic changes: Secondary | ICD-10-CM

## 2013-07-06 DIAGNOSIS — E785 Hyperlipidemia, unspecified: Secondary | ICD-10-CM

## 2013-07-06 DIAGNOSIS — F3289 Other specified depressive episodes: Secondary | ICD-10-CM

## 2013-07-06 DIAGNOSIS — N189 Chronic kidney disease, unspecified: Secondary | ICD-10-CM

## 2013-07-06 DIAGNOSIS — F32A Depression, unspecified: Secondary | ICD-10-CM

## 2013-07-06 DIAGNOSIS — I69359 Hemiplegia and hemiparesis following cerebral infarction affecting unspecified side: Secondary | ICD-10-CM

## 2013-07-06 DIAGNOSIS — J449 Chronic obstructive pulmonary disease, unspecified: Secondary | ICD-10-CM

## 2013-07-06 DIAGNOSIS — D631 Anemia in chronic kidney disease: Secondary | ICD-10-CM

## 2013-07-06 DIAGNOSIS — F329 Major depressive disorder, single episode, unspecified: Secondary | ICD-10-CM

## 2013-07-06 DIAGNOSIS — I69320 Aphasia following cerebral infarction: Secondary | ICD-10-CM

## 2013-07-06 DIAGNOSIS — I69920 Aphasia following unspecified cerebrovascular disease: Secondary | ICD-10-CM

## 2013-07-06 DIAGNOSIS — E119 Type 2 diabetes mellitus without complications: Secondary | ICD-10-CM

## 2013-07-06 DIAGNOSIS — I69959 Hemiplegia and hemiparesis following unspecified cerebrovascular disease affecting unspecified side: Secondary | ICD-10-CM

## 2013-07-06 DIAGNOSIS — F0390 Unspecified dementia without behavioral disturbance: Secondary | ICD-10-CM

## 2013-07-06 NOTE — Progress Notes (Signed)
Patient ID: Justin Sherman, male   DOB: 05-14-1940, 73 y.o.   MRN: [id number]    Nursing Home Location:  [place] of Service: SNF (31)  PCP: REED, TIFFANY, DO  No Known Allergies  Chief Complaint  Patient presents with  . Medical Managment of Chronic Issues    HPI:  73 yo male with h/o COPD with frequent exacerbations, anxiety, controlled DMII, prior stroke with hemiparesis and aphasia, depression, hyperlipidemia and ureteral stones, Pt currently doing well; staff has no concerns, ROS and HPI limited due to aphasia but pt is able to answer yes and no  Review of Systems:  Review of Systems  Constitutional: Negative for fever, chills and malaise/fatigue.  Respiratory: Negative for cough, shortness of breath and wheezing.        On chronic O2 via [place] for COPD  Cardiovascular: Negative for chest pain and leg swelling.  Gastrointestinal: Negative for heartburn, abdominal pain and constipation.  Genitourinary: Negative for dysuria.  Musculoskeletal: Negative for myalgias.  Skin: Negative.   Neurological: Positive for sensory change and focal weakness. Negative for dizziness.  Psychiatric/Behavioral: Positive for memory loss. Negative for depression.     Past Medical History  Diagnosis Date  . 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 Acute conjunctivitis, unspecified   . Aortic aneurysm of unspecified site without mention of rupture   . Flaccid hemiplegia affecting dominant side   . Type II or unspecified type diabetes mellitus without mention of complication, not stated as uncontrolled   . Depressive disorder, not elsewhere classified   . Unspecified essential hypertension   . Obstructive chronic bronchitis with exacerbation   . Anxiety state, unspecified   . Esophageal reflux   . Other and unspecified hyperlipidemia   . Pressure ulcer, other site(707.09)   . Dementia   . Chronic kidney disease 08/04/2011   Acute renal failure per note 08/04/2011-Dr. Art Nyoka Cowden  . Stroke    Past Surgical History  Procedure Laterality Date  . Cystoscopy w/ ureteral stent placement  08/07/2011    Procedure: CYSTOSCOPY WITH RETROGRADE PYELOGRAM/URETERAL STENT PLACEMENT;  Surgeon: Ailene Rud, MD;  Location: [facility];  Service: Urology;  Laterality: Bilateral;  bilateral stent placement ureterosopy  . Cystoscopy/retrograde/ureteroscopy  12/07/2011    Procedure: CYSTOSCOPY/RETROGRADE/URETEROSCOPY;  Surgeon: Ailene Rud, MD;  Location: [facility];  Service: Urology;  Laterality: Bilateral;  . Cystoscopy w/ ureteral stent placement  12/07/2011    Procedure: CYSTOSCOPY WITH STENT REPLACEMENT;  Surgeon: Ailene Rud, MD;  Location: [facility];  Service: Urology;  Laterality: Bilateral;  (BIL) JJ STENT EXCHANGE  . Cystoscopy with urethral dilatation  12/07/2011    Procedure: CYSTOSCOPY WITH URETHRAL DILATATION;  Surgeon: Ailene Rud, MD;  Location: [facility];  Service: Urology;;   Social History:   reports that he has never smoked. He does not have any smokeless tobacco history on file. He reports that he does not drink alcohol or use illicit drugs.  No family history on file.  Medications: Patient's Medications  New Prescriptions   No medications on file  Previous Medications   ALBUTEROL (PROVENTIL) (2.5 MG/3ML) 0.083% NEBULIZER SOLUTION    Take 2.5 mg by nebulization every 2 (two) hours as needed for wheezing.   ASPIRIN EC 81 MG TABLET    Take 81 mg by mouth daily.   CITALOPRAM (CELEXA) 20 MG TABLET    Take 20 mg by mouth at bedtime.    FERROUS SULFATE 325 (65  FE) MG TABLET    Take 325 mg by mouth daily with breakfast.    FLUTICASONE-SALMETEROL (ADVAIR) 500-50 MCG/DOSE AEPB    Inhale 1 puff into the lungs 2 (two) times daily.   GUAIFENESIN (MUCINEX) 600 MG 12 HR TABLET    Take 600 mg by mouth 2 (two) times daily.    INSULIN ASPART (NOVOLOG) 100 UNIT/ML INJECTION    Inject 5 Units into the skin 4  (four) times daily -  before meals and at bedtime. Give for CBG > 150. Before meals and at bedtime   INSULIN GLARGINE (LANTUS) 100 UNIT/ML INJECTION    Inject 8 Units into the skin at bedtime.    IPRATROPIUM-ALBUTEROL (DUONEB) 0.5-2.5 (3) MG/3ML SOLN    Take 3 mLs by nebulization every 6 (six) hours as needed. For shortness of breath   LORAZEPAM (ATIVAN) 0.5 MG TABLET    Take 1/2 tablet once a day as needed   MULTIPLE VITAMINS-MINERALS (MULTIVITAMINS THER. W/MINERALS) TABS    Take 1 tablet by mouth daily.    OXYCODONE (OXY IR/ROXICODONE) 5 MG IMMEDIATE RELEASE TABLET    Take 2 tablets (10 mg total) by mouth every 4 (four) hours as needed for pain. For pain   POLYETHYLENE GLYCOL (MIRALAX / GLYCOLAX) PACKET    Take 17 g by mouth daily.   RANITIDINE (ZANTAC) 75 MG TABLET    Take 75 mg by mouth daily as needed for heartburn.    SIMVASTATIN (ZOCOR) 40 MG TABLET    Take 40 mg by mouth at bedtime.    THIAMINE 100 MG TABLET    Take 100 mg by mouth every morning.   Modified Medications   No medications on file  Discontinued Medications   FLUTICASONE-SALMETEROL (ADVAIR) 250-50 MCG/DOSE AEPB    Inhale 1 puff into the lungs 2 (two) times daily.      Physical Exam:  Filed Vitals:   07/06/13 1406  BP: 120/61  Pulse: 76  Temp: 98.1 F (36.7 C)  Resp: 20  Weight: 184 lb (83.462 kg)   Physical Exam  Constitutional: He is well-developed, well-nourished, and in no distress. No distress.  HENT:  Head: Normocephalic and atraumatic.  Mouth/Throat: Oropharynx is clear and moist. No oropharyngeal exudate.  Neck: Normal range of motion. Neck supple.  Cardiovascular: Normal rate, regular rhythm and normal heart sounds.   Pulmonary/Chest: Effort normal and breath sounds normal.  Abdominal: Soft. Bowel sounds are normal.  Musculoskeletal: He exhibits no edema and no tenderness.  Right sided weakness due to CVA  Neurological: He is alert.  Skin: Skin is warm and dry. He is not diaphoretic.      Labs  reviewed: Basic Metabolic Panel:  Recent Labs  08/18/12 0140 08/19/12 0425 08/20/12 0410  NA 135 137 136  K 4.3 4.1 4.2  CL 99 100 100  CO2 28 31 30   GLUCOSE 137* 130* 114*  BUN 29* 30* 30*  CREATININE 2.22* 2.35* 2.02*  CALCIUM 9.5 9.3 9.4   Liver Function Tests:  Recent Labs  08/17/12 1000  AST 11  ALT 7  ALKPHOS 76  BILITOT 0.5  PROT 7.1  ALBUMIN 3.1*   No results found for this basename: LIPASE, AMYLASE,  in the last 8760 hours No results found for this basename: AMMONIA,  in the last 8760 hours CBC:  Recent Labs  08/17/12 1000 08/18/12 0140 08/19/12 0425 08/20/12 0410  WBC 20.5* 12.7* 10.9* 8.9  NEUTROABS 17.5*  --   --   --   HGB  10.9* 10.6* 9.8* 9.5*  HCT 34.3* 33.7* 31.9* 30.7*  MCV 85.1 86.2 86.9 86.2  PLT 165 136* 129* 142*   Cardiac Enzymes: No results found for this basename: CKTOTAL, CKMB, CKMBINDEX, TROPONINI,  in the last 8760 hours BNP: No components found with this basename: POCBNP,  CBG:  Recent Labs  08/19/12 2208 08/20/12 0722 08/20/12 1117  GLUCAP 173* 109* 127*   TSH: No results found for this basename: TSH,  in the last 8760 hours A1C: Lab Results  Component Value Date   HGBA1C 6.1* 08/17/2012   CBC with Diff  Result: 11/17/2012 11:13 AM ( Status: F ) C  WBC 11.5 H 4.0-10.5 K/uL SLN  RBC 3.46 L 4.22-5.81 MIL/uL SLN  Hemoglobin 9.4 L 13.0-17.0 g/dL SLN  Hematocrit 29.8 L 39.0-52.0 % SLN  MCV 86.1 78.0-100.0 fL SLN  MCH 27.2 26.0-34.0 pg SLN  MCHC 31.5 30.0-36.0 g/dL SLN  RDW 14.4 11.5-15.5 % SLN  Platelet Count 178 150-400 K/uL SLN  Granulocyte % 66 43-77 % SLN  Absolute Gran 7.6 1.7-7.7 K/uL SLN  Lymph % 18 12-46 % SLN  Absolute Lymph 2.0 0.7-4.0 K/uL SLN  Mono % 8 3-12 % SLN  Absolute Mono 1.0 0.1-1.0 K/uL SLN  Eos % 8 H 0-5 % SLN  Absolute Eos 0.9 H 0.0-0.7 K/uL SLN  Baso % 0 0-1 % SLN  Absolute Baso 0.0 0.0-0.1 K/uL SLN  Smear Review Criteria for review not met SLN  Basic Metabolic Panel  Result:  05/31/9796 11:47 AM ( Status: F )  Sodium 138 135-145 mEq/L SLN  Potassium 4.4 3.5-5.3 mEq/L SLN  Chloride 105 96-112 mEq/L SLN  CO2 28 19-32 mEq/L SLN  Glucose 109 H 70-99 mg/dL SLN  BUN 42 H 6-23 mg/dL SLN  Creatinine 2.17 H 0.50-1.35 mg/dL SLN  Calcium 9.4  Assessment/Plan 1. COPD, moderate -recently increase in advair; without recent exacerbation or worsening of symptoms on chronic O2, nebs and advair   2. Type II or unspecified type diabetes mellitus without mention of complication, not stated as uncontrolled -last A1c 7.7, blood sugars reviewed and are stable; will cont lantus and novolog   3. 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 Anemia in CKD (chronic kidney disease) hgb of 10.4 in dec, follow up labs scheduled    5. Depression -mood appears good and well controlled on celexa   6. Other and unspecified hyperlipidemia LDL at goal on 05/18/13 at 83, pt conts on zocor   7. CKD (chronic kidney disease) stage 3, GFR 30-59 ml/min Cr of 2.51 in dec, follow up labs already ordered   8. Dementia -without major changes in the last months

## 2013-10-13 ENCOUNTER — Non-Acute Institutional Stay (SKILLED_NURSING_FACILITY): Payer: Medicare Other | Admitting: Internal Medicine

## 2013-10-13 DIAGNOSIS — N183 Chronic kidney disease, stage 3 unspecified: Secondary | ICD-10-CM

## 2013-10-13 DIAGNOSIS — I69359 Hemiplegia and hemiparesis following cerebral infarction affecting unspecified side: Secondary | ICD-10-CM

## 2013-10-13 DIAGNOSIS — J449 Chronic obstructive pulmonary disease, unspecified: Secondary | ICD-10-CM

## 2013-10-13 DIAGNOSIS — I69320 Aphasia following cerebral infarction: Secondary | ICD-10-CM

## 2013-10-13 DIAGNOSIS — R339 Retention of urine, unspecified: Secondary | ICD-10-CM

## 2013-10-13 DIAGNOSIS — E119 Type 2 diabetes mellitus without complications: Secondary | ICD-10-CM

## 2013-10-13 DIAGNOSIS — F32A Depression, unspecified: Secondary | ICD-10-CM

## 2013-10-13 DIAGNOSIS — F3289 Other specified depressive episodes: Secondary | ICD-10-CM

## 2013-10-13 DIAGNOSIS — F329 Major depressive disorder, single episode, unspecified: Secondary | ICD-10-CM

## 2013-10-13 DIAGNOSIS — E785 Hyperlipidemia, unspecified: Secondary | ICD-10-CM

## 2013-10-13 DIAGNOSIS — I69920 Aphasia following unspecified cerebrovascular disease: Secondary | ICD-10-CM

## 2013-10-13 DIAGNOSIS — I69959 Hemiplegia and hemiparesis following unspecified cerebrovascular disease affecting unspecified side: Secondary | ICD-10-CM

## 2013-10-13 NOTE — Progress Notes (Signed)
MRN: [id number] Name: Justin Sherman  Sex: male Age: 73 y.o. DOB: 08/17/1940  [facility] Facility[facility]Room: 217B Level Of Care: SNF Provider: Merrilee SeashoreALEXANDER, ANNE D Emergency Contacts: Extended Emergency Contact Information Primary Emergency Contact: Audria Nineearce,Jimmy          [place], [place] [id number] [place] Home Phone: [phone] Relation: Son  Code Status: FULL  Allergies: Review of patient's allergies indicates no known allergies.  Chief Complaint  Patient presents with  . Medical Management of Chronic Issues    HPI: Patient is 73 y.o. male who is being seen for routine issues.  Past Medical History  Diagnosis Date  . 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 Acute conjunctivitis, unspecified   . Aortic aneurysm of unspecified site without mention of rupture   . Flaccid hemiplegia affecting dominant side   . Type II or unspecified type diabetes mellitus without mention of complication, not stated as uncontrolled   . Depressive disorder, not elsewhere classified   . Unspecified essential hypertension   . Obstructive chronic bronchitis with exacerbation   . Anxiety state, unspecified   . Esophageal reflux   . Other and unspecified hyperlipidemia   . Pressure ulcer, other site(707.09)   . Dementia   . Chronic kidney disease 08/04/2011    Acute renal failure per note 08/04/2011-Dr. Art Chilton [facility]  . Stroke     Past Surgical History  Procedure Laterality Date  . Cystoscopy w/ ureteral stent placement  08/07/2011    Procedure: CYSTOSCOPY WITH RETROGRADE PYELOGRAM/URETERAL STENT PLACEMENT;  Surgeon: Kathi LudwigSigmund I Tannenbaum, MD;  Location: [facility];  Service: Urology;  Laterality: Bilateral;  bilateral stent placement ureterosopy  . Cystoscopy/retrograde/ureteroscopy  12/07/2011    Procedure: CYSTOSCOPY/RETROGRADE/URETEROSCOPY;  Surgeon: Kathi LudwigSigmund I Tannenbaum, MD;  Location: [facility];  Service: Urology;  Laterality: Bilateral;  .  Cystoscopy w/ ureteral stent placement  12/07/2011    Procedure: CYSTOSCOPY WITH STENT REPLACEMENT;  Surgeon: Kathi LudwigSigmund I Tannenbaum, MD;  Location: [facility];  Service: Urology;  Laterality: Bilateral;  (BIL) JJ STENT EXCHANGE  . Cystoscopy with urethral dilatation  12/07/2011    Procedure: CYSTOSCOPY WITH URETHRAL DILATATION;  Surgeon: Kathi LudwigSigmund I Tannenbaum, MD;  Location: [facility];  Service: Urology;;      Medication List       This list is accurate as of: 10/13/13 11:59 PM.  Always use your most recent med list.               albuterol (2.5 MG/3ML) 0.083% nebulizer solution  Commonly known as:  PROVENTIL  Take 2.5 mg by nebulization every 2 (two) hours as needed for wheezing.     aspirin EC 81 MG tablet  Take 81 mg by mouth daily.     citalopram 20 MG tablet  Commonly known as:  CELEXA  Take 20 mg by mouth at bedtime.     ferrous sulfate 325 (65 FE) MG tablet  Take 325 mg by mouth daily with breakfast.     Fluticasone-Salmeterol 500-50 MCG/DOSE Aepb  Commonly known as:  ADVAIR  Inhale 1 puff into the lungs 2 (two) times daily.     guaiFENesin 600 MG 12 hr tablet  Commonly known as:  MUCINEX  Take 600 mg by mouth 2 (two) times daily.     insulin aspart 100 UNIT/ML injection  Commonly known as:  novoLOG  - Inject 5 Units into the skin 4 (four) times daily -  before meals and at bedtime. Give for CBG > 150.  -  Before meals and at bedtime     insulin glargine 100 UNIT/ML injection  Commonly known as:  LANTUS  Inject 8 Units into the skin at bedtime.     ipratropium-albuterol 0.5-2.5 (3) MG/3ML Soln  Commonly known as:  DUONEB  Take 3 mLs by nebulization every 6 (six) hours as needed. For shortness of breath     LORazepam 0.5 MG tablet  Commonly known as:  ATIVAN  Take 1/2 tablet once a day as needed     multivitamins ther. w/minerals Tabs tablet  Take 1 tablet by mouth daily.     oxyCODONE 5 MG immediate release tablet  Commonly known as:  Oxy IR/ROXICODONE  Take 2  tablets (10 mg total) by mouth every 4 (four) hours as needed for pain. For pain     polyethylene glycol packet  Commonly known as:  MIRALAX / GLYCOLAX  Take 17 g by mouth daily.     ranitidine 75 MG tablet  Commonly known as:  ZANTAC  Take 75 mg by mouth daily as needed for heartburn.     simvastatin 40 MG tablet  Commonly known as:  ZOCOR  Take 40 mg by mouth at bedtime.     thiamine 100 MG tablet  Take 100 mg by mouth every morning.        No orders of the defined types were placed in this encounter.    Immunization History  Administered Date(s) Administered  . Influenza Whole 01/28/2013  . Pneumococcal-Unspecified 04/30/2011    History  Substance Use Topics  . Smoking status: Never Smoker   . Smokeless tobacco: Not on file  . Alcohol Use: No    Review of Systems  DATA OBTAINED: from patient; with aphasia but indicated no c/o GENERAL: Feels well no fevers, fatigue, appetite changes SKIN: No itching, rash HEENT: No complaint RESPIRATORY: No cough, wheezing, SOB CARDIAC: No chest pain, palpitations, lower extremity edema  GI: No abdominal pain, No N/V/D or constipation, No heartburn or reflux  GU: No dysuria, frequency or urgency, or incontinence  MUSCULOSKELETAL: No unrelieved bone/joint pain NEUROLOGIC: No headache, dizziness or focal weakness PSYCHIATRIC: No overt anxiety or sadness. Sleeps well.   Filed Vitals:   10/13/13 2320  BP: 116/60  Pulse: 80  Temp: 98.9 F (37.2 C)  Resp: 20    Physical Exam  GENERAL APPEARANCE: Alert, minconversant. Appropriately groomed. No acute distress  SKIN: No diaphoresis rash, HEENT: Unremarkable RESPIRATORY: Breathing is even, unlabored. Lung sounds are clear   CARDIOVASCULAR: Heart RRR no murmurs, rubs or gallops. No peripheral edema  GASTROINTESTINAL: Abdomen is soft, non-tender, not distended w/ normal bowel sounds.  GENITOURINARY: Bladder non tender, not distended  MUSCULOSKELETAL: RUE  contracture NEUROLOGIC: Cranial nerves 2-12 grossly intact; R side paresis PSYCHIATRIC: Mood and affect appropriate to situation, no behavioral issues  Patient Active Problem List   Diagnosis Date Noted  . Anemia in CKD (chronic kidney disease) 03/09/2013  . 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 Other and unspecified hyperlipidemia 08/18/2012  . COPD, moderate 08/18/2012  . Depression 08/18/2012  . CKD (chronic kidney disease) stage 3, GFR 30-59 ml/min 08/17/2012  . Ureteral calculi 07/06/2012  . ARF (acute renal failure) 08/04/2011  . Urinary retention 08/04/2011  . Urethral stricture 08/04/2011  . Obstructive chronic bronchitis with exacerbation   . Type II or unspecified type diabetes mellitus without mention of complication, not stated as uncontrolled   . Dementia   . Flaccid hemiplegia affecting dominant side  CBC   5/15  WBC 13.3  10.3/32.4  PLT 196    Component Value Date/Time   WBC 8.9 08/20/2012 0410   RBC 3.56* 08/20/2012 0410   HGB 9.5* 08/20/2012 0410   HCT 30.7* 08/20/2012 0410   PLT 142* 08/20/2012 0410   MCV 86.2 08/20/2012 0410   LYMPHSABS 1.3 08/17/2012 1000   MONOABS 1.3* 08/17/2012 1000   EOSABS 0.3 08/17/2012 1000   BASOSABS 0.1 08/17/2012 1000    CMP     Component Value Date/Time   NA 136 08/20/2012 0410   K 4.2 08/20/2012 0410   CL 100 08/20/2012 0410   CO2 30 08/20/2012 0410   GLUCOSE 114* 08/20/2012 0410   BUN 30* 08/20/2012 0410   CREATININE 2.02* 08/20/2012 0410   CALCIUM 9.4 08/20/2012 0410   PROT 7.1 08/17/2012 1000   ALBUMIN 3.1* 08/17/2012 1000   AST 11 08/17/2012 1000   ALT 7 08/17/2012 1000   ALKPHOS 76 08/17/2012 1000   BILITOT 0.5 08/17/2012 1000   GFRNONAA 31* 08/20/2012 0410   GFRAA 36* 08/20/2012 0410    Assessment and Plan  COPD, moderate On prn O2; no recent exacerbations; continue advair scheduled and prns  Type II or unspecified type diabetes mellitus without mention of complication, not  stated as uncontrolled On insulin and zocor. Needs labs, will order. 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

## 2013-10-18 ENCOUNTER — Encounter: Payer: Self-pay | Admitting: Internal Medicine

## 2013-10-18 NOTE — Assessment & Plan Note (Signed)
No known peoblems-needs labs

## 2013-10-18 NOTE — Assessment & Plan Note (Signed)
On prn O2; no recent exacerbations; continue advair scheduled and prns

## 2013-10-18 NOTE — Assessment & Plan Note (Signed)
Continues , pt is doing well ;out in the hall in his wheelchair

## 2013-10-18 NOTE — Assessment & Plan Note (Signed)
Continue celexa

## 2013-10-18 NOTE — Assessment & Plan Note (Signed)
Continue zocor 40 mg ;needs FLP

## 2013-10-18 NOTE — Assessment & Plan Note (Signed)
On insulin and zocor. Needs labs, will order. Last A1c was 6.1

## 2013-10-18 NOTE — Assessment & Plan Note (Signed)
No infections

## 2013-10-21 LAB — CBC AND DIFFERENTIAL
HCT: 33 % — AB (ref 41–53)
HEMOGLOBIN: 9.6 g/dL — AB (ref 13.5–17.5)
Platelets: 247 10*3/uL (ref 150–399)
WBC: 13.4 10*3/mL

## 2013-10-21 LAB — BASIC METABOLIC PANEL
BUN: 28 mg/dL — AB (ref 4–21)
Creatinine: 2.1 mg/dL — AB (ref 0.6–1.3)
Glucose: 176 mg/dL
POTASSIUM: 4.5 mmol/L (ref 3.4–5.3)
SODIUM: 141 mmol/L (ref 137–147)

## 2013-10-21 LAB — HEMOGLOBIN A1C: Hgb A1c MFr Bld: 7.8 % — AB (ref 4.0–6.0)

## 2013-10-21 LAB — LIPID PANEL
Cholesterol: 130 mg/dL (ref 0–200)
HDL: 29 mg/dL — AB (ref 35–70)
LDL CALC: 49 mg/dL
Triglycerides: 257 mg/dL — AB (ref 40–160)

## 2014-01-24 ENCOUNTER — Emergency Department (HOSPITAL_COMMUNITY)
Admission: EM | Admit: 2014-01-24 | Discharge: 2014-01-24 | Disposition: A | Discharge status: Home / Self Care | Payer: Medicare Other | Attending: Emergency Medicine | Admitting: Emergency Medicine

## 2014-01-24 ENCOUNTER — Encounter (HOSPITAL_COMMUNITY): Payer: Self-pay | Admitting: Emergency Medicine

## 2014-01-24 DIAGNOSIS — I129 Hypertensive chronic kidney disease with stage 1 through stage 4 chronic kidney disease, or unspecified chronic kidney disease: Secondary | ICD-10-CM | POA: Insufficient documentation

## 2014-01-24 DIAGNOSIS — Z8719 Personal history of other diseases of the digestive system: Secondary | ICD-10-CM | POA: Diagnosis not present

## 2014-01-24 DIAGNOSIS — Z8673 Personal history of transient ischemic attack (TIA), and cerebral infarction without residual deficits: Secondary | ICD-10-CM | POA: Insufficient documentation

## 2014-01-24 DIAGNOSIS — Z043 Encounter for examination and observation following other accident: Secondary | ICD-10-CM | POA: Insufficient documentation

## 2014-01-24 DIAGNOSIS — J449 Chronic obstructive pulmonary disease, unspecified: Secondary | ICD-10-CM | POA: Insufficient documentation

## 2014-01-24 DIAGNOSIS — Z79899 Other long term (current) drug therapy: Secondary | ICD-10-CM | POA: Diagnosis not present

## 2014-01-24 DIAGNOSIS — E119 Type 2 diabetes mellitus without complications: Secondary | ICD-10-CM | POA: Insufficient documentation

## 2014-01-24 DIAGNOSIS — F411 Generalized anxiety disorder: Secondary | ICD-10-CM | POA: Diagnosis not present

## 2014-01-24 DIAGNOSIS — F0390 Unspecified dementia without behavioral disturbance: Secondary | ICD-10-CM | POA: Insufficient documentation

## 2014-01-24 DIAGNOSIS — Z794 Long term (current) use of insulin: Secondary | ICD-10-CM | POA: Diagnosis not present

## 2014-01-24 DIAGNOSIS — F329 Major depressive disorder, single episode, unspecified: Secondary | ICD-10-CM | POA: Insufficient documentation

## 2014-01-24 DIAGNOSIS — Z872 Personal history of diseases of the skin and subcutaneous tissue: Secondary | ICD-10-CM | POA: Insufficient documentation

## 2014-01-24 DIAGNOSIS — Z7951 Long term (current) use of inhaled steroids: Secondary | ICD-10-CM | POA: Diagnosis not present

## 2014-01-24 DIAGNOSIS — G8100 Flaccid hemiplegia affecting unspecified side: Secondary | ICD-10-CM | POA: Diagnosis not present

## 2014-01-24 DIAGNOSIS — W19XXXA Unspecified fall, initial encounter: Secondary | ICD-10-CM

## 2014-01-24 DIAGNOSIS — N189 Chronic kidney disease, unspecified: Secondary | ICD-10-CM | POA: Insufficient documentation

## 2014-01-24 DIAGNOSIS — E785 Hyperlipidemia, unspecified: Secondary | ICD-10-CM | POA: Insufficient documentation

## 2014-01-24 NOTE — ED Notes (Addendum)
[facility] called to transport pt back to facility after being medically cleared by EDP. Was notified that [facility] is busy and "it may be a while."

## 2014-01-24 NOTE — ED Provider Notes (Signed)
CSN: [id number]     Arrival date & time 01/24/14  1242 History   First MD Initiated Contact with Patient 01/24/14 1250     Chief Complaint  Patient presents with  . Fall     HPI  Patient presents from nursing facility after a witnessed fall. The patient has dementia. Level V caveat. The patient himself denies complaints, answers repetitively "no"to all questions, including pain, nausea. He is disoriented, but awake, alert, interacting  Past Medical History  Diagnosis Date  . 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 Acute conjunctivitis, unspecified   . Aortic aneurysm of unspecified site without mention of rupture   . Flaccid hemiplegia affecting dominant side   . Type II or unspecified type diabetes mellitus without mention of complication, not stated as uncontrolled   . Depressive disorder, not elsewhere classified   . Unspecified essential hypertension   . Obstructive chronic bronchitis with exacerbation   . Anxiety state, unspecified   . Esophageal reflux   . Other and unspecified hyperlipidemia   . Pressure ulcer, other site(707.09)   . Dementia   . Chronic kidney disease 08/04/2011    Acute renal failure per note 08/04/2011-Dr. Art Chilton [facility]  . Stroke    Past Surgical History  Procedure Laterality Date  . Cystoscopy w/ ureteral stent placement  08/07/2011    Procedure: CYSTOSCOPY WITH RETROGRADE PYELOGRAM/URETERAL STENT PLACEMENT;  Surgeon: Kathi LudwigSigmund I Tannenbaum, MD;  Location: [facility];  Service: Urology;  Laterality: Bilateral;  bilateral stent placement ureterosopy  . Cystoscopy/retrograde/ureteroscopy  12/07/2011    Procedure: CYSTOSCOPY/RETROGRADE/URETEROSCOPY;  Surgeon: Kathi LudwigSigmund I Tannenbaum, MD;  Location: [facility];  Service: Urology;  Laterality: Bilateral;  . Cystoscopy w/ ureteral stent placement  12/07/2011    Procedure: CYSTOSCOPY WITH STENT REPLACEMENT;  Surgeon: Kathi LudwigSigmund I Tannenbaum, MD;  Location: [facility];  Service: Urology;   Laterality: Bilateral;  (BIL) JJ STENT EXCHANGE  . Cystoscopy with urethral dilatation  12/07/2011    Procedure: CYSTOSCOPY WITH URETHRAL DILATATION;  Surgeon: Kathi LudwigSigmund I Tannenbaum, MD;  Location: [facility];  Service: Urology;;   No family history on file. History  Substance Use Topics  . Smoking status: Never Smoker   . Smokeless tobacco: Not on file  . Alcohol Use: No    Review of Systems  Unable to perform ROS: Dementia      Allergies  Review of patient's allergies indicates no known allergies.  Home Medications   Prior to Admission medications   Medication Sig Start Date End Date Taking? Authorizing Provider  acetaminophen (TYLENOL) 500 MG tablet Take 500 mg by mouth every 8 (eight) hours as needed (for pain).   Yes Historical Provider, MD  albuterol (PROVENTIL) (2.5 MG/3ML) 0.083% nebulizer solution Take 2.5 mg by nebulization every 2 (two) hours as needed for wheezing.   Yes Historical Provider, MD  atorvastatin (LIPITOR) 20 MG tablet Take 20 mg by mouth at bedtime.   Yes Historical Provider, MD  citalopram (CELEXA) 20 MG tablet Take 20 mg by mouth at bedtime.    Yes Historical Provider, MD  ferrous sulfate 325 (65 FE) MG tablet Take 325 mg by mouth daily with breakfast.    Yes Historical Provider, MD  Fluticasone-Salmeterol (ADVAIR) 500-50 MCG/DOSE AEPB Inhale 1 puff into the lungs 2 (two) times daily.   Yes Historical Provider, MD  guaiFENesin (MUCINEX) 600 MG 12 hr tablet Take 600 mg by mouth 2 (two) times daily.    Yes Historical Provider, MD  insulin aspart (NOVOLOG) 100 UNIT/ML  injection Inject 5 Units into the skin 4 (four) times daily -  before meals and at bedtime. Give for CBG > 150. Before meals and at bedtime   Yes Historical Provider, MD  insulin glargine (LANTUS) 100 UNIT/ML injection Inject 8 Units into the skin at bedtime.    Yes Historical Provider, MD  ipratropium-albuterol (DUONEB) 0.5-2.5 (3) MG/3ML SOLN Take 3 mLs by nebulization every 6 (six) hours as  needed. For shortness of breath   Yes Historical Provider, MD  Multiple Vitamins-Minerals (MULTIVITAMINS THER. W/MINERALS) TABS Take 1 tablet by mouth every morning.    Yes Historical Provider, MD  polyethylene glycol (MIRALAX / GLYCOLAX) packet Take 17 g by mouth every morning.    Yes Historical Provider, MD  thiamine 100 MG tablet Take 100 mg by mouth every morning.    Yes Historical Provider, MD   BP 141/80  Pulse 74  Temp(Src) 98.3 F (36.8 C) (Oral)  Resp 16  SpO2 98% Physical Exam  Nursing note and vitals reviewed. Constitutional: He appears well-developed and well-nourished. He does not appear ill. No distress.  Elderly male resting in bed, in no distress, speaking clearly, briefly  HENT:  Head: Normocephalic and atraumatic.  Eyes: Conjunctivae and EOM are normal.  Neck: Neck supple. No tracheal deviation present. No thyromegaly present.  No cervical spine tenderness to palpation, either midline or paravertebral  Cardiovascular: Normal rate and regular rhythm.   Pulmonary/Chest: Effort normal. No stridor. No respiratory distress.  Abdominal: He exhibits no distension.  Musculoskeletal: He exhibits no edema.  Neurological: He is alert. No cranial nerve deficit.  Patient has right sided hemiplegia  Skin: Skin is warm and dry.  Psychiatric: Cognition and memory are impaired.    ED Course  Procedures (including critical care time)  MDM   Final diagnoses:  Fall, initial encounter    Elderly male presents from nursing facility after a fall.  Patient has no complaints, is awake, alert and hemodynamically stable, with no evidence for substantial trauma, neurologic dysfunction and baseline. Patient was discharged in good condition to his nursing facility.    Gerhard Munchobert Audrianna Driskill, MD 01/24/14 1309

## 2014-01-24 NOTE — Discharge Instructions (Signed)

## 2014-01-24 NOTE — ED Notes (Signed)
Linen from nursing facility placed in pt belonging bag and will remain by pt.

## 2014-01-24 NOTE — ED Notes (Signed)
Pt sts that he slid out of his chair and bumped his head, but denies injury or pain. Pt in NAD

## 2014-01-24 NOTE — ED Notes (Signed)
Dr.Lockwood at bedside  

## 2014-01-24 NOTE — ED Notes (Signed)
Bed: [id number] Expected date: 01/24/14 Expected time: 12:36 PM Means of arrival: Ambulance Comments: Fall, no injury facility protocol to have pt seen

## 2014-01-24 NOTE — ED Notes (Signed)
[facility]- Per EMS, pt slid out of a chair this am. Pt hit head, but did not have LOC. Pt has no injury but needs to be checked per facility protocol. Pt is alert but has hx of dementia.

## 2014-01-27 ENCOUNTER — Non-Acute Institutional Stay (SKILLED_NURSING_FACILITY): Payer: Medicare Other | Admitting: Internal Medicine

## 2014-01-27 ENCOUNTER — Encounter: Payer: Self-pay | Admitting: Internal Medicine

## 2014-01-27 ENCOUNTER — Non-Acute Institutional Stay (INDEPENDENT_AMBULATORY_CARE_PROVIDER_SITE_OTHER): Payer: Medicare Other | Admitting: Internal Medicine

## 2014-01-27 DIAGNOSIS — I69320 Aphasia following cerebral infarction: Secondary | ICD-10-CM

## 2014-01-27 DIAGNOSIS — J449 Chronic obstructive pulmonary disease, unspecified: Secondary | ICD-10-CM

## 2014-01-27 DIAGNOSIS — N183 Chronic kidney disease, stage 3 unspecified: Secondary | ICD-10-CM

## 2014-01-27 DIAGNOSIS — E1149 Type 2 diabetes mellitus with other diabetic neurological complication: Secondary | ICD-10-CM

## 2014-01-27 DIAGNOSIS — F32A Depression, unspecified: Secondary | ICD-10-CM

## 2014-01-27 DIAGNOSIS — E785 Hyperlipidemia, unspecified: Secondary | ICD-10-CM

## 2014-01-27 DIAGNOSIS — F329 Major depressive disorder, single episode, unspecified: Secondary | ICD-10-CM

## 2014-01-27 DIAGNOSIS — I69359 Hemiplegia and hemiparesis following cerebral infarction affecting unspecified side: Secondary | ICD-10-CM

## 2014-01-27 DIAGNOSIS — E1140 Type 2 diabetes mellitus with diabetic neuropathy, unspecified: Secondary | ICD-10-CM

## 2014-01-27 DIAGNOSIS — E1169 Type 2 diabetes mellitus with other specified complication: Secondary | ICD-10-CM

## 2014-01-27 DIAGNOSIS — N201 Calculus of ureter: Secondary | ICD-10-CM

## 2014-01-27 NOTE — Progress Notes (Signed)
Patient ID: Justin Sherman, male   DOB: 08/30/1940, 73 y.o.   MRN: [id number]  [facility] Provider:  Gwenith Spitziffany L. Renato Gailseed, D.O., C.M.D.  Code Status:  DNR  Chief Complaint  Patient presents with  . Medical Management of Chronic Issues    recent fall from wheelchair with ED visit, no injury    HPI:  73 yo white male long term care resident with h/o stroke with aphasia and hemiplegia, asthma, anxiety, and DMII seen for med mgt of his chronic diseases.  Nursing staff had sent him to the ED due to a fall from his wheelchair but he had sustained no injuries.  He was sent promptly back.  She has no complaints of pain today.  His ROS is negative also as below.  He is able to typically give correct yes and no answers.  He speaks minimally outside of that.  Review of Systems:  Review of Systems  Constitutional: Negative for fever.  HENT: Negative for congestion.   Eyes: Negative for blurred vision.  Respiratory: Negative for shortness of breath.   Cardiovascular: Negative for chest pain and leg swelling.  Gastrointestinal: Negative for constipation and blood in stool.  Genitourinary: Negative for dysuria.  Musculoskeletal: Positive for falls. Negative for back pain and myalgias.  Skin: Negative for rash.  Neurological: Positive for sensory change and focal weakness. Negative for dizziness and loss of consciousness.  Endo/Heme/Allergies: Bruises/bleeds easily.  Psychiatric/Behavioral: Positive for memory loss.    Medications: Patient's Medications  New Prescriptions   No medications on file  Previous Medications   ACETAMINOPHEN (TYLENOL) 500 MG TABLET    Take 500 mg by mouth every 8 (eight) hours as needed (for pain).   ALBUTEROL (PROVENTIL) (2.5 MG/3ML) 0.083% NEBULIZER SOLUTION    Take 2.5 mg by nebulization every 2 (two) hours as needed for wheezing.   ATORVASTATIN (LIPITOR) 20 MG TABLET    Take 20 mg by mouth at bedtime.   CITALOPRAM (CELEXA) 20 MG TABLET    Take  20 mg by mouth at bedtime.    FERROUS SULFATE 325 (65 FE) MG TABLET    Take 325 mg by mouth daily with breakfast.    FLUTICASONE-SALMETEROL (ADVAIR) 500-50 MCG/DOSE AEPB    Inhale 1 puff into the lungs 2 (two) times daily.   GUAIFENESIN (MUCINEX) 600 MG 12 HR TABLET    Take 600 mg by mouth 2 (two) times daily.    INSULIN ASPART (NOVOLOG) 100 UNIT/ML INJECTION    Inject 5 Units into the skin 4 (four) times daily -  before meals and at bedtime. Give for CBG > 150. Before meals and at bedtime   INSULIN GLARGINE (LANTUS) 100 UNIT/ML INJECTION    Inject 8 Units into the skin at bedtime.    IPRATROPIUM-ALBUTEROL (DUONEB) 0.5-2.5 (3) MG/3ML SOLN    Take 3 mLs by nebulization every 6 (six) hours as needed. For shortness of breath   MULTIPLE VITAMINS-MINERALS (MULTIVITAMINS THER. W/MINERALS) TABS    Take 1 tablet by mouth every morning.    POLYETHYLENE GLYCOL (MIRALAX / GLYCOLAX) PACKET    Take 17 g by mouth every morning.    THIAMINE 100 MG TABLET    Take 100 mg by mouth every morning.   Modified Medications   No medications on file  Discontinued Medications   No medications on file    Physical Exam: Filed Vitals:   01/27/14 1311  BP: 130/68  Pulse: 68  Temp: 97.4 F (36.3 C)  Resp:  18  Height: 5\' 6"  (1.676 m)  Weight: 167 lb (75.751 kg)  SpO2: 96%  Physical Exam  Constitutional: He appears well-developed and well-nourished. No distress.  Resting in bed  Cardiovascular: Normal rate, regular rhythm, normal heart sounds and intact distal pulses.   Pulmonary/Chest: Effort normal and breath sounds normal. He has no wheezes.  Wearing his oxygen  Abdominal: Soft. Bowel sounds are normal. He exhibits no distension and no mass. There is no tenderness.  Musculoskeletal: Normal range of motion.  Neurological: He is alert.  Aphasic--answers yes and no and some other short responses; hemiplegia  Skin: Skin is warm and dry. There is pallor.  Psychiatric: He has a normal mood and affect.    Labs  reviewed: Abstracted from 10/21/13  Assessment/Plan 1. Type 2 diabetes mellitus with neurological manifestations, controlled -cont lantus, novolog meal coverage, statin; not on ace due to renal failure -unclear what happened to the aspirin he used to take--will need to restart  2. COPD, moderate -has been stable lately w/o exacerbation--records have shown asthma in the past -cont advair, mucinex, duonebs, proventil neb, oxygen  3. 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 CKD (chronic kidney disease) stage 3, GFR 30-59 ml/min -cont to avoid nsaids -cannot take ace due to this at this point  5. Ureteral calculi -previously followed closely by urology for this -prior care plan meeting with his brother, we decided not to put him through any additional invasive procedures to remove kidney stones  6. Depression -continues on celexa at bedtime which has helped reduce his episodes of what I thought was anxiety-induced laryngospasm that were causing frequent hospitalizations, also has gotten off ativan since taking the celexa  7. Hyperlipidemia associated with type 2 diabetes mellitus -cont lipitor due to his prior stroke for secondary prevention--would not stop just because of age when he is suffering no ill effects from it  Family/ staff Communication: discussed with his nurse  Goals of care: long term care resident, no aggressive interventions, has been discussed with his brother during a care plan meeting in the past  Labs/tests ordered:  Cbc, bmp, hba1c, urine microalbumin next draw

## 2014-01-28 LAB — CBC AND DIFFERENTIAL
HCT: 32 % — AB (ref 41–53)
HEMOGLOBIN: 9.6 g/dL — AB (ref 13.5–17.5)
Neutrophils Absolute: 8 /uL
PLATELETS: 250 10*3/uL (ref 150–399)
WBC: 11.6 10^3/mL

## 2014-01-28 LAB — HEMOGLOBIN A1C: Hgb A1c MFr Bld: 7.6 % — AB (ref 4.0–6.0)

## 2014-01-31 NOTE — Progress Notes (Signed)
Patient ID: Midge MiniumCharles Stephenson, male   DOB: 06/26/1940, 73 y.o.   MRN: [id number] Abstracted only See visit earlier

## 2014-02-15 ENCOUNTER — Emergency Department (HOSPITAL_COMMUNITY): Payer: Medicare Other

## 2014-02-15 ENCOUNTER — Encounter (HOSPITAL_COMMUNITY): Payer: Self-pay | Admitting: *Deleted

## 2014-02-15 ENCOUNTER — Emergency Department (HOSPITAL_COMMUNITY)
Admission: EM | Admit: 2014-02-15 | Discharge: 2014-02-15 | Disposition: A | Discharge status: Home / Self Care | Payer: Medicare Other | Attending: Emergency Medicine | Admitting: Emergency Medicine

## 2014-02-15 DIAGNOSIS — Z8673 Personal history of transient ischemic attack (TIA), and cerebral infarction without residual deficits: Secondary | ICD-10-CM | POA: Insufficient documentation

## 2014-02-15 DIAGNOSIS — Z872 Personal history of diseases of the skin and subcutaneous tissue: Secondary | ICD-10-CM | POA: Insufficient documentation

## 2014-02-15 DIAGNOSIS — Y929 Unspecified place or not applicable: Secondary | ICD-10-CM | POA: Diagnosis not present

## 2014-02-15 DIAGNOSIS — S0990XA Unspecified injury of head, initial encounter: Secondary | ICD-10-CM | POA: Diagnosis present

## 2014-02-15 DIAGNOSIS — N189 Chronic kidney disease, unspecified: Secondary | ICD-10-CM | POA: Insufficient documentation

## 2014-02-15 DIAGNOSIS — E785 Hyperlipidemia, unspecified: Secondary | ICD-10-CM | POA: Insufficient documentation

## 2014-02-15 DIAGNOSIS — E119 Type 2 diabetes mellitus without complications: Secondary | ICD-10-CM | POA: Diagnosis not present

## 2014-02-15 DIAGNOSIS — W1839XA Other fall on same level, initial encounter: Secondary | ICD-10-CM | POA: Diagnosis not present

## 2014-02-15 DIAGNOSIS — W19XXXA Unspecified fall, initial encounter: Secondary | ICD-10-CM

## 2014-02-15 DIAGNOSIS — Z8719 Personal history of other diseases of the digestive system: Secondary | ICD-10-CM | POA: Diagnosis not present

## 2014-02-15 DIAGNOSIS — F0390 Unspecified dementia without behavioral disturbance: Secondary | ICD-10-CM | POA: Insufficient documentation

## 2014-02-15 DIAGNOSIS — S0081XA Abrasion of other part of head, initial encounter: Secondary | ICD-10-CM | POA: Diagnosis not present

## 2014-02-15 DIAGNOSIS — Y9389 Activity, other specified: Secondary | ICD-10-CM | POA: Insufficient documentation

## 2014-02-15 DIAGNOSIS — I129 Hypertensive chronic kidney disease with stage 1 through stage 4 chronic kidney disease, or unspecified chronic kidney disease: Secondary | ICD-10-CM | POA: Insufficient documentation

## 2014-02-15 DIAGNOSIS — Y998 Other external cause status: Secondary | ICD-10-CM | POA: Insufficient documentation

## 2014-02-15 DIAGNOSIS — Z79899 Other long term (current) drug therapy: Secondary | ICD-10-CM | POA: Diagnosis not present

## 2014-02-15 DIAGNOSIS — Z794 Long term (current) use of insulin: Secondary | ICD-10-CM | POA: Diagnosis not present

## 2014-02-15 DIAGNOSIS — Z8669 Personal history of other diseases of the nervous system and sense organs: Secondary | ICD-10-CM | POA: Diagnosis not present

## 2014-02-15 DIAGNOSIS — F419 Anxiety disorder, unspecified: Secondary | ICD-10-CM | POA: Diagnosis not present

## 2014-02-15 DIAGNOSIS — Z7951 Long term (current) use of inhaled steroids: Secondary | ICD-10-CM | POA: Insufficient documentation

## 2014-02-15 DIAGNOSIS — J449 Chronic obstructive pulmonary disease, unspecified: Secondary | ICD-10-CM | POA: Insufficient documentation

## 2014-02-15 DIAGNOSIS — F329 Major depressive disorder, single episode, unspecified: Secondary | ICD-10-CM | POA: Insufficient documentation

## 2014-02-15 NOTE — ED Notes (Addendum)
Patient fell today at skilled nursing facility while being transferred from bed by nursing staff. No loss of consciousness but he hit is head on the bed side rail. Patient sent out for evaluation per policy. Patient is not on any blood thinners.

## 2014-02-15 NOTE — ED Notes (Signed)
[facility] called for transportation  

## 2014-02-15 NOTE — ED Provider Notes (Signed)
CSN: [id number]     Arrival date & time 02/15/14  1408 History   First MD Initiated Contact with Patient 02/15/14 1507     Chief Complaint  Patient presents with  . Fall     (Consider location/radiation/quality/duration/timing/severity/associated sxs/prior Treatment) Patient is a 73 y.o. male presenting with fall. The history is provided by the nursing home.  Fall   He fell while being assisted into bed, injuring his forehead. No LOC.  Level V Caveat- Altered Mental Status  Past Medical History  Diagnosis Date  . 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 Acute conjunctivitis, unspecified   . Aortic aneurysm of unspecified site without mention of rupture   . Flaccid hemiplegia affecting dominant side   . Type II or unspecified type diabetes mellitus without mention of complication, not stated as uncontrolled   . Depressive disorder, not elsewhere classified   . Unspecified essential hypertension   . Obstructive chronic bronchitis with exacerbation   . Anxiety state, unspecified   . Esophageal reflux   . Other and unspecified hyperlipidemia   . Pressure ulcer, other site(707.09)   . Dementia   . Chronic kidney disease 08/04/2011    Acute renal failure per note 08/04/2011-Dr. Art Chilton Si  . Stroke    Past Surgical History  Procedure Laterality Date  . Cystoscopy w/ ureteral stent placement  08/07/2011    Procedure: CYSTOSCOPY WITH RETROGRADE PYELOGRAM/URETERAL STENT PLACEMENT;  Surgeon: Kathi Ludwig, MD;  Location: [facility];  Service: Urology;  Laterality: Bilateral;  bilateral stent placement ureterosopy  . Cystoscopy/retrograde/ureteroscopy  12/07/2011    Procedure: CYSTOSCOPY/RETROGRADE/URETEROSCOPY;  Surgeon: Kathi Ludwig, MD;  Location: [facility];  Service: Urology;  Laterality: Bilateral;  . Cystoscopy w/ ureteral stent placement  12/07/2011    Procedure: CYSTOSCOPY WITH STENT REPLACEMENT;  Surgeon: Kathi Ludwig, MD;   Location: [facility];  Service: Urology;  Laterality: Bilateral;  (BIL) JJ STENT EXCHANGE  . Cystoscopy with urethral dilatation  12/07/2011    Procedure: CYSTOSCOPY WITH URETHRAL DILATATION;  Surgeon: Kathi Ludwig, MD;  Location: [facility];  Service: Urology;;   History reviewed. No pertinent family history. History  Substance Use Topics  . Smoking status: Never Smoker   . Smokeless tobacco: Not on file  . Alcohol Use: No    Review of Systems  Unable to perform ROS     Allergies  Review of patient's allergies indicates no known allergies.  Home Medications   Prior to Admission medications   Medication Sig Start Date End Date Taking? Authorizing Provider  atorvastatin (LIPITOR) 20 MG tablet Take 20 mg by mouth at bedtime.   Yes Historical Provider, MD  citalopram (CELEXA) 20 MG tablet Take 20 mg by mouth at bedtime.    Yes Historical Provider, MD  ferrous sulfate 325 (65 FE) MG tablet Take 325 mg by mouth daily with breakfast.    Yes Historical Provider, MD  Fluticasone-Salmeterol (ADVAIR) 500-50 MCG/DOSE AEPB Inhale 1 puff into the lungs 2 (two) times daily.   Yes Historical Provider, MD  guaiFENesin (MUCINEX) 600 MG 12 hr tablet Take 600 mg by mouth 2 (two) times daily.    Yes Historical Provider, MD  insulin aspart (NOVOLOG) 100 UNIT/ML injection Inject 5 Units into the skin 4 (four) times daily -  before meals and at bedtime. Give for CBG > 150. Before meals and at bedtime   Yes Historical Provider, MD  insulin glargine (LANTUS) 100 UNIT/ML injection Inject 8 Units into the skin  at bedtime.    Yes Historical Provider, MD  ipratropium-albuterol (DUONEB) 0.5-2.5 (3) MG/3ML SOLN Take 3 mLs by nebulization every 6 (six) hours as needed. For shortness of breath   Yes Historical Provider, MD  Multiple Vitamins-Minerals (MULTIVITAMINS THER. W/MINERALS) TABS Take 1 tablet by mouth every morning.    Yes Historical Provider, MD  polyethylene glycol (MIRALAX / GLYCOLAX) packet Take 17 g  by mouth every morning.    Yes Historical Provider, MD  thiamine 100 MG tablet Take 100 mg by mouth every morning.    Yes Historical Provider, MD  acetaminophen (TYLENOL) 500 MG tablet Take 500 mg by mouth every 8 (eight) hours as needed (for pain).    Historical Provider, MD  albuterol (PROVENTIL) (2.5 MG/3ML) 0.083% nebulizer solution Take 2.5 mg by nebulization every 2 (two) hours as needed for wheezing.    Historical Provider, MD   BP 134/66 mmHg  Pulse 78  Temp(Src) 98.2 F (36.8 C) (Oral)  SpO2 98% Physical Exam  Constitutional: He appears well-developed.  Elderly, frail  HENT:  Head: Normocephalic.  Right Ear: External ear normal.  Left Ear: External ear normal.  Small abrasion right lateral forehead.No deformity or bleeding.  Eyes: Conjunctivae and EOM are normal. Pupils are equal, round, and reactive to light.  Neck: Normal range of motion and phonation normal. Neck supple.  Cardiovascular: Normal rate, regular rhythm and normal heart sounds.   Pulmonary/Chest: Effort normal and breath sounds normal. He exhibits no bony tenderness.  Abdominal: Soft. There is no tenderness.  Musculoskeletal: Normal range of motion.  No pain with PROM, arms and legs.  Neurological: He is alert. No cranial nerve deficit or sensory deficit. He exhibits normal muscle tone. Coordination normal.  Right hemiplegia. Expressive aphasia. Follows commands. Speaks occasional , short words,to answer.  Skin: Skin is warm, dry and intact.  Psychiatric: He has a normal mood and affect. His behavior is normal.  Nursing note and vitals reviewed.   ED Course  Procedures (including critical care time)   Wound care forehead by nurse.    Labs Review Labs Reviewed - No data to display  Imaging Review Ct Head Wo Contrast  02/15/2014   CLINICAL DATA:  Patient fell today at skilled nursing facility while being transferred from bed by nursing staff. No loss of consciousness but he hit is head on the bed  side rail. Patient sent out for evaluation per policy. Patient is not on any blood thinners.  EXAM: CT HEAD WITHOUT CONTRAST  TECHNIQUE: Contiguous axial images were obtained from the base of the skull through the vertex without intravenous contrast.  COMPARISON:  01/17/2012 and 08/04/2011  FINDINGS: Examination demonstrates no change in an old moderate size left MCA infarct. Mild compensatory dilatation of the left lateral ventricle. Ventricles and cisterns are otherwise unremarkable. There is no focal mass, mass effect or shift of midline structures. There is no acute hemorrhage. There is no evidence to suggest acute infarction. The there is moderate opacification over the ethmoid sinus with moderate bilateral air-fluid levels within the maxillary sinuses unchanged. Minimal mucosal membrane thickening over the sphenoid sinus. Mastoid air cells are clear.  IMPRESSION: No acute intracranial findings.  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  Significant sinus inflammatory disease with bilateral maxillary sinus air-fluid levels suggesting an acute component.   Electronically Signed   By: Elberta Fortisaniel  Boyle M.D.   On: 02/15/2014 15:19     EKG Interpretation None      MDM   Final diagnoses:  Fall, initial encounter  Head injuries, initial encounter  Abrasion of forehead, initial encounter   Fall with minor injury. Apparently mechanical while being assisted.  Nursing Notes Reviewed/ Care Coordinated Applicable Imaging Reviewed Interpretation of Laboratory Data incorporated into ED treatment  The patient appears reasonably screened and/or stabilized for discharge and I doubt any other medical condition or other [facility] requiring further screening, evaluation, or treatment in the ED at this time prior to discharge.  Plan: Home Medications- usual; Home Treatments- wound care; return here if the recommended treatment, does not improve the symptoms; Recommended follow up- PCP prn  Flint MelterElliott L Labrandon Knoch, MD 02/15/14  1555

## 2014-02-15 NOTE — Discharge Instructions (Signed)
Abrasion An abrasion is a cut or scrape of the skin. Abrasions do not extend through all layers of the skin and most heal within 10 days. It is important to care for your abrasion properly to prevent infection. CAUSES  Most abrasions are caused by falling on, or gliding across, the ground or other surface. When your skin rubs on something, the outer and inner layer of skin rubs off, causing an abrasion. DIAGNOSIS  Your caregiver will be able to diagnose an abrasion during a physical exam.  TREATMENT  Your treatment depends on how large and deep the abrasion is. Generally, your abrasion will be cleaned with water and a mild soap to remove any dirt or debris. An antibiotic ointment may be put over the abrasion to prevent an infection. A bandage (dressing) may be wrapped around the abrasion to keep it from getting dirty.  You may need a tetanus shot if:  You cannot remember when you had your last tetanus shot.  You have never had a tetanus shot.  The injury broke your skin. If you get a tetanus shot, your arm may swell, get red, and feel warm to the touch. This is common and not a problem. If you need a tetanus shot and you choose not to have one, there is a rare chance of getting tetanus. Sickness from tetanus can be serious.  HOME CARE INSTRUCTIONS   If a dressing was applied, change it at least once a day or as directed by your caregiver. If the bandage sticks, soak it off with warm water.   Wash the area with water and a mild soap to remove all the ointment 2 times a day. Rinse off the soap and pat the area dry with a clean towel.   Reapply any ointment as directed by your caregiver. This will help prevent infection and keep the bandage from sticking. Use gauze over the wound and under the dressing to help keep the bandage from sticking.   Change your dressing right away if it becomes wet or dirty.   Only take over-the-counter or prescription medicines for pain, discomfort, or fever as  directed by your caregiver.   Follow up with your caregiver within 24-48 hours for a wound check, or as directed. If you were not given a wound-check appointment, look closely at your abrasion for redness, swelling, or pus. These are signs of infection. SEEK IMMEDIATE MEDICAL CARE IF:   You have increasing pain in the wound.   You have redness, swelling, or tenderness around the wound.   You have pus coming from the wound.   You have a fever or persistent symptoms for more than 2-3 days.  You have a fever and your symptoms suddenly get worse.  You have a bad smell coming from the wound or dressing.  MAKE SURE YOU:   Understand these instructions.  Will watch your condition.  Will get help right away if you are not doing well or get worse. Document Released: 01/03/2005 Document Revised: 03/12/2012 Document Reviewed: 02/27/2011 ExitCare Patient Information 2015 [employer]. This information is not intended to replace advice given to you by your health care provider. Make sure you discuss any questions you have with your health care provider.  Head Injury You have a head injury. Headaches and throwing up (vomiting) are common after a head injury. It should be easy to wake up from sleeping. Sometimes you must stay in the hospital. Most problems happen within the first 24 hours. Side effects may   occur up to 7-10 days after the injury.  WHAT ARE THE TYPES OF HEAD INJURIES? Head injuries can be as minor as a bump. Some head injuries can be more severe. More severe head injuries include:  A jarring injury to the brain (concussion).  A bruise of the brain (contusion). This mean there is bleeding in the brain that can cause swelling.  A cracked skull (skull fracture).  Bleeding in the brain that collects, clots, and forms a bump (hematoma). WHEN SHOULD I GET HELP RIGHT AWAY?   You are confused or sleepy.  You cannot be woken up.  You feel sick to your stomach (nauseous) or  keep throwing up (vomiting).  Your dizziness or unsteadiness is getting worse.  You have very bad, lasting headaches that are not helped by medicine. Take medicines only as told by your doctor.  You cannot use your arms or legs like normal.  You cannot walk.  You notice changes in the black spots in the center of the colored part of your eye (pupil).  You have clear or bloody fluid coming from your nose or ears.  You have trouble seeing. During the next 24 hours after the injury, you must stay with someone who can watch you. This person should get help right away (call 911 in the [place] if you start to shake and are not able to control it (have seizures), you pass out, or you are unable to wake up. HOW CAN I PREVENT A HEAD INJURY IN THE FUTURE?  Wear seat belts.  Wear a helmet while bike riding and playing sports like football.  Stay away from dangerous activities around the house. WHEN CAN I RETURN TO NORMAL ACTIVITIES AND ATHLETICS? See your doctor before doing these activities. You should not do normal activities or play contact sports until 1 week after the following symptoms have stopped:  Headache that does not go away.  Dizziness.  Poor attention.  Confusion.  Memory problems.  Sickness to your stomach or throwing up.  Tiredness.  Fussiness.  Bothered by bright lights or loud noises.  Anxiousness or depression.  Restless sleep. MAKE SURE YOU:   Understand these instructions.  Will watch your condition.  Will get help right away if you are not doing well or get worse. Document Released: 03/08/2008 Document Revised: 08/10/2013 Document Reviewed: 12/01/2012 ExitCare Patient Information 2015 [employer]. This information is not intended to replace advice given to you by your health care provider. Make sure you discuss any questions you have with your health care provider.  

## 2014-02-15 NOTE — ED Notes (Signed)
Bed: WA03 Expected date:  Expected time:  Means of arrival:  Comments: EMS 

## 2014-03-11 ENCOUNTER — Non-Acute Institutional Stay (SKILLED_NURSING_FACILITY): Payer: Medicare Other | Admitting: Adult Health

## 2014-03-11 DIAGNOSIS — E1149 Type 2 diabetes mellitus with other diabetic neurological complication: Secondary | ICD-10-CM

## 2014-03-11 DIAGNOSIS — E1169 Type 2 diabetes mellitus with other specified complication: Secondary | ICD-10-CM

## 2014-03-11 DIAGNOSIS — D631 Anemia in chronic kidney disease: Secondary | ICD-10-CM

## 2014-03-11 DIAGNOSIS — N183 Chronic kidney disease, stage 3 unspecified: Secondary | ICD-10-CM

## 2014-03-11 DIAGNOSIS — G8100 Flaccid hemiplegia affecting unspecified side: Secondary | ICD-10-CM

## 2014-03-11 DIAGNOSIS — J449 Chronic obstructive pulmonary disease, unspecified: Secondary | ICD-10-CM

## 2014-03-11 DIAGNOSIS — N189 Chronic kidney disease, unspecified: Secondary | ICD-10-CM

## 2014-03-11 DIAGNOSIS — E1140 Type 2 diabetes mellitus with diabetic neuropathy, unspecified: Secondary | ICD-10-CM

## 2014-03-11 DIAGNOSIS — F32A Depression, unspecified: Secondary | ICD-10-CM

## 2014-03-11 DIAGNOSIS — E785 Hyperlipidemia, unspecified: Secondary | ICD-10-CM

## 2014-03-11 DIAGNOSIS — F329 Major depressive disorder, single episode, unspecified: Secondary | ICD-10-CM

## 2014-03-14 ENCOUNTER — Encounter: Payer: Self-pay | Admitting: Adult Health

## 2014-03-14 LAB — LIPID PANEL
CHOLESTEROL: 120 mg/dL (ref 0–200)
HDL: 32 mg/dL — AB (ref 35–70)
LDL CALC: 48 mg/dL
Triglycerides: 201 mg/dL — AB (ref 40–160)

## 2014-03-14 NOTE — Progress Notes (Signed)
Patient ID: Justin Sherman, male   DOB: 05/04/1940, 73 y.o.   MRN: [id number]  [facility]     No Known Allergies     Chief Complaint  Patient presents with  . Medical Management of Chronic Issues    HPI:  He is a long term resident of this facility being seen for the management of his chronic illnesses. Overall there is little change in his status. He is unable to fully participate in the hpi or ros. The nursing is not voicing any concerns at this time/    Past Medical History  Diagnosis Date  . 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 Acute conjunctivitis, unspecified   . Aortic aneurysm of unspecified site without mention of rupture   . Flaccid hemiplegia affecting dominant side   . Type II or unspecified type diabetes mellitus without mention of complication, not stated as uncontrolled   . Depressive disorder, not elsewhere classified   . Unspecified essential hypertension   . Obstructive chronic bronchitis with exacerbation   . Anxiety state, unspecified   . Esophageal reflux   . Other and unspecified hyperlipidemia   . Pressure ulcer, other site(707.09)   . Dementia   . Chronic kidney disease 08/04/2011    Acute renal failure per note 08/04/2011-Dr. Art Chilton SiGreen  . Stroke     Past Surgical History  Procedure Laterality Date  . Cystoscopy w/ ureteral stent placement  08/07/2011    Procedure: CYSTOSCOPY WITH RETROGRADE PYELOGRAM/URETERAL STENT PLACEMENT;  Surgeon: Kathi LudwigSigmund I Tannenbaum, MD;  Location: [facility];  Service: Urology;  Laterality: Bilateral;  bilateral stent placement ureterosopy  . Cystoscopy/retrograde/ureteroscopy  12/07/2011    Procedure: CYSTOSCOPY/RETROGRADE/URETEROSCOPY;  Surgeon: Kathi LudwigSigmund I Tannenbaum, MD;  Location: [facility];  Service: Urology;  Laterality: Bilateral;  . Cystoscopy w/ ureteral stent placement  12/07/2011    Procedure: CYSTOSCOPY WITH STENT REPLACEMENT;  Surgeon: Kathi LudwigSigmund I Tannenbaum, MD;  Location: [facility];   Service: Urology;  Laterality: Bilateral;  (BIL) JJ STENT EXCHANGE  . Cystoscopy with urethral dilatation  12/07/2011    Procedure: CYSTOSCOPY WITH URETHRAL DILATATION;  Surgeon: Kathi LudwigSigmund I Tannenbaum, MD;  Location: [facility];  Service: Urology;;    VITAL SIGNS BP 130/68 mmHg  Pulse 68  Ht 5\' 6"  (1.676 m)  Wt 163 lb (73.936 kg)  BMI 26.32 kg/m2  SpO2 97%   Outpatient Encounter Prescriptions as of 03/11/2014  Medication Sig  . acetaminophen (TYLENOL) 500 MG tablet Take 500 mg by mouth every 8 (eight) hours as needed (for pain).  Marland Kitchen. albuterol (PROVENTIL) (2.5 MG/3ML) 0.083% nebulizer solution Take 2.5 mg by nebulization every 2 (two) hours as needed for wheezing.  Marland Kitchen. atorvastatin (LIPITOR) 20 MG tablet Take 20 mg by mouth at bedtime.  . citalopram (CELEXA) 20 MG tablet Take 20 mg by mouth at bedtime.   . ferrous sulfate 325 (65 FE) MG tablet Take 325 mg by mouth daily with breakfast.   . Fluticasone-Salmeterol (ADVAIR) 500-50 MCG/DOSE AEPB Inhale 1 puff into the lungs 2 (two) times daily.  Marland Kitchen. guaiFENesin (MUCINEX) 600 MG 12 hr tablet Take 600 mg by mouth 2 (two) times daily.   . insulin aspart (NOVOLOG) 100 UNIT/ML injection Inject 5 Units into the skin 4 (four) times daily -  before meals and at bedtime. Give for CBG > 150. Before meals and at bedtime  . insulin glargine (LANTUS) 100 UNIT/ML injection Inject 8 Units into the skin at bedtime.   Marland Kitchen. ipratropium-albuterol (DUONEB) 0.5-2.5 (3) MG/3ML SOLN Take  3 mLs by nebulization every 6 (six) hours as needed. For shortness of breath  . Multiple Vitamins-Minerals (MULTIVITAMINS THER. W/MINERALS) TABS Take 1 tablet by mouth every morning.   . polyethylene glycol (MIRALAX / GLYCOLAX) packet Take 17 g by mouth every morning.   . thiamine 100 MG tablet Take 100 mg by mouth every morning.      SIGNIFICANT DIAGNOSTIC EXAMS   LABS REVIEWED:   10-21-13: wbc 13.;4 hgb 9.6; hct 32.7; mcv 87.6; plt 247; glucose 176; bun 28.9; creat 2.1;k+4.5; na++141;  liver normal albumin 3.3 hgb a1c 7.8; chol 130; ldl 49; trig 257 01-28-14: wbc 11.6; hgb 9.6; hct 32.5 ;mcv 86.9; plt 250 hgb a1c 7.6     Review of Systems  Unable to perform ROS    Physical Exam  Constitutional: He appears well-developed and well-nourished. No distress.  Neck: Neck supple. No JVD present. No thyromegaly present.  Cardiovascular: Normal rate, regular rhythm and intact distal pulses.   Respiratory: Breath sounds normal. No respiratory distress. He has no wheezes.  GI: Soft. Bowel sounds are normal. He exhibits no distension. There is no tenderness.  Musculoskeletal: He exhibits no edema.  Right side hemiparesis   Neurological: He is alert.  Skin: Skin is warm. He is not diaphoretic.       ASSESSMENT/ PLAN:  1. Copd: he is presently stable ; he is 02 dependent; will continue advair 500/50 twice daily; mucinex twice daily  has albuterol neb every 2 hours as needed and duoneb every 6 hours as needed will monitor  2. Dyslipidemia: will continue lipitor 20 mg daily  3. Anemia: will continue iron daily   4. Diabetes: is stable will continue lantus 8 units daily; and novolog 5 units prior to meals for cbg >=150  5. Depression: is stable will continue celexa 20 mg daily   6. Constipation; will continue miralax daily   7. ckd stage III: no change in status most recent creat is 2.1   Will check lipids      Justin Innocenteborah Dvontae Ruan NP [facility]  Contact [phone] Monday through Friday 8am- 5pm  After hours call [phone]

## 2014-04-15 ENCOUNTER — Encounter: Payer: Self-pay | Admitting: Internal Medicine

## 2014-04-15 ENCOUNTER — Non-Acute Institutional Stay (SKILLED_NURSING_FACILITY): Payer: Medicare Other | Admitting: Internal Medicine

## 2014-04-15 DIAGNOSIS — E785 Hyperlipidemia, unspecified: Secondary | ICD-10-CM

## 2014-04-15 DIAGNOSIS — J441 Chronic obstructive pulmonary disease with (acute) exacerbation: Secondary | ICD-10-CM

## 2014-04-15 DIAGNOSIS — R030 Elevated blood-pressure reading, without diagnosis of hypertension: Secondary | ICD-10-CM

## 2014-04-15 DIAGNOSIS — IMO0001 Reserved for inherently not codable concepts without codable children: Secondary | ICD-10-CM

## 2014-04-15 DIAGNOSIS — E1140 Type 2 diabetes mellitus with diabetic neuropathy, unspecified: Secondary | ICD-10-CM

## 2014-04-15 DIAGNOSIS — E1149 Type 2 diabetes mellitus with other diabetic neurological complication: Secondary | ICD-10-CM

## 2014-04-15 DIAGNOSIS — I69359 Hemiplegia and hemiparesis following cerebral infarction affecting unspecified side: Secondary | ICD-10-CM

## 2014-04-15 DIAGNOSIS — E1169 Type 2 diabetes mellitus with other specified complication: Secondary | ICD-10-CM

## 2014-04-15 DIAGNOSIS — D631 Anemia in chronic kidney disease: Secondary | ICD-10-CM

## 2014-04-15 DIAGNOSIS — I69320 Aphasia following cerebral infarction: Secondary | ICD-10-CM

## 2014-04-15 DIAGNOSIS — N183 Chronic kidney disease, stage 3 unspecified: Secondary | ICD-10-CM

## 2014-04-15 DIAGNOSIS — I10 Essential (primary) hypertension: Secondary | ICD-10-CM

## 2014-04-15 DIAGNOSIS — N189 Chronic kidney disease, unspecified: Secondary | ICD-10-CM

## 2014-04-15 NOTE — Progress Notes (Signed)
Patient ID: Justin Sherman, male   DOB: 1940-07-23, 74 y.o.   MRN: [id number]    [facility]    Place of Service: SNF (31)    No Known Allergies  Chief Complaint  Patient presents with  . Medical Management of Chronic Issues    COPD/O2 dependent, type 2 DM, dementia without behavioral disturbance, hyperlipidemia, MDD, anemia of chronic disease, CKD stage 3, hx CVA  . Acute Visit    cough    HPI:  74 yo male seen today for f/u of above. He c/o cough, nonproductive. He takes mucinex and uses duonebs and advair. He has chest tightness and SOB on O2. No f/c. No CP.(+) sore throat. Roommate with similar sx's. Last A1c 7.6% in Oct 2015. No low BS reactions. He is eating well and sleeps well. No change in bowel/bladder habits. Other than cough, no nursing issues.   Medications: Patient's Medications  New Prescriptions   No medications on file  Previous Medications   ACETAMINOPHEN (TYLENOL) 500 MG TABLET    Take 500 mg by mouth every 8 (eight) hours as needed (for pain).   ALBUTEROL (PROVENTIL) (2.5 MG/3ML) 0.083% NEBULIZER SOLUTION    Take 2.5 mg by nebulization every 2 (two) hours as needed for wheezing.   ATORVASTATIN (LIPITOR) 20 MG TABLET    Take 20 mg by mouth at bedtime.   CITALOPRAM (CELEXA) 20 MG TABLET    Take 20 mg by mouth at bedtime.    FERROUS SULFATE 325 (65 FE) MG TABLET    Take 325 mg by mouth daily with breakfast.    FLUTICASONE-SALMETEROL (ADVAIR) 500-50 MCG/DOSE AEPB    Inhale 1 puff into the lungs 2 (two) times daily.   GUAIFENESIN (MUCINEX) 600 MG 12 HR TABLET    Take 600 mg by mouth 2 (two) times daily.    INSULIN ASPART (NOVOLOG) 100 UNIT/ML INJECTION    Inject 5 Units into the skin 4 (four) times daily -  before meals and at bedtime. Give for CBG > 150. Before meals and at bedtime   INSULIN GLARGINE (LANTUS) 100 UNIT/ML INJECTION    Inject 8 Units into the skin at bedtime.    IPRATROPIUM-ALBUTEROL (DUONEB) 0.5-2.5 (3) MG/3ML SOLN    Take 3  mLs by nebulization every 6 (six) hours as needed. For shortness of breath   MULTIPLE VITAMINS-MINERALS (MULTIVITAMINS THER. W/MINERALS) TABS    Take 1 tablet by mouth every morning.    POLYETHYLENE GLYCOL (MIRALAX / GLYCOLAX) PACKET    Take 17 g by mouth every morning.    THIAMINE 100 MG TABLET    Take 100 mg by mouth every morning.   Modified Medications   No medications on file  Discontinued Medications   No medications on file     Review of Systems  As above. All other systems reviewed are negative  Filed Vitals:   04/15/14 1533  BP: 150/71  Pulse: 77  Temp: 97.2 F (36.2 C)  SpO2: 96%   There is no weight on file to calculate BMI.  Physical Exam CONSTITUTIONAL: Looks ill in NAD. Awake and alert HEENT: PERRLA. No sinus TTP. Oropharynx clear and without exudate. No mucosal lesions noted. NECK: Supple. Nontender. No palpable cervical or supraclavicular lymph nodes. No carotid bruit b/l.  CVS: Regular rate without murmur, gallop or rub. LUNGS:  b/l end expiratory wheezing with prolonged expiratory phase. BS congested. No rales PSYCH: flat Affect no aggressive behavior   Labs reviewed: Nursing Home on 04/15/2014  Component Date Value Ref Range Status  . Hemoglobin 01/28/2014 9.6* 13.5 - 17.5 g/dL Final  . HCT 45/40/981110/22/2015 32* 41 - 53 % Final  . Neutrophils Absolute 01/28/2014 8   Final  . Platelets 01/28/2014 250  150 - 399 K/L Final  . WBC 01/28/2014 11.6   Final  . Triglycerides 03/14/2014 201* 40 - 160 mg/dL Final  . Cholesterol 91/47/829512/09/2013 120  0 - 200 mg/dL Final  . HDL 62/13/086512/09/2013 32* 35 - 70 mg/dL Final  . LDL Cholesterol 03/14/2014 48   Final  . Hgb A1c MFr Bld 01/28/2014 7.6* 4.0 - 6.0 % Final  Nursing Home on 01/27/2014  Component Date Value Ref Range Status  . Hemoglobin 10/21/2013 9.6* 13.5 - 17.5 g/dL Final  . HCT 78/46/962907/15/2015 33* 41 - 53 % Final  . Platelets 10/21/2013 247  150 - 399 K/L Final  . WBC 10/21/2013 13.4   Final  . Glucose 10/21/2013 176    Final  . BUN 10/21/2013 28* 4 - 21 mg/dL Final  . Creatinine 52/84/132407/15/2015 2.1* 0.6 - 1.3 mg/dL Final  . Potassium 40/10/272507/15/2015 4.5  3.4 - 5.3 mmol/L Final  . Sodium 10/21/2013 141  137 - 147 mmol/L Final  . Triglycerides 10/21/2013 257* 40 - 160 mg/dL Final  . Cholesterol 36/64/403407/15/2015 130  0 - 200 mg/dL Final  . HDL 74/25/956307/15/2015 29* 35 - 70 mg/dL Final  . LDL Cholesterol 10/21/2013 49   Final  . Hgb A1c MFr Bld 10/21/2013 7.8* 4.0 - 6.0 % Final     Assessment/Plan    ICD-9-CM ICD-10-CM   1. Obstructive chronic bronchitis with exacerbation 491.21 J44.1 Rx Avelox 400mg  daily x 5; continue nebs and advair  2. Type 2 diabetes mellitus with neurological manifestations, controlled 250.60 E11.40 Check CMP, A1c in 2 mos; continue CBGs as ordered  3. CKD (chronic kidney disease) stage 3, GFR 30-59 ml/min 585.3 N18.3 Follow Creatinine  4. Anemia in CKD (chronic kidney disease) 285.21 N18.9 Check CBC in 2 mos   585.9 D63.1   5. Hyperlipidemia associated with type 2 diabetes mellitus 250.80 E11.69 Check fasting lipid panel in 2 mos   272.4 E78.5     - continue current medications as Rx  - PT completed as of Apr 05, 2014  - BP elevated today probably due to acute illness. Will monitor  Jiali Linney S. Ancil Linseyarter, D. O., F. A. C. O. I.  [facility] [address] [place], [place] [address] [phone] Office (Wednesdays and Fridays 8 AM - 5 PM) [phone] Cell (Monday-Friday 8 AM - 5 PM)

## 2014-05-20 ENCOUNTER — Non-Acute Institutional Stay (SKILLED_NURSING_FACILITY): Payer: Medicare Other | Admitting: Adult Health

## 2014-05-20 DIAGNOSIS — K5900 Constipation, unspecified: Secondary | ICD-10-CM

## 2014-05-20 DIAGNOSIS — D631 Anemia in chronic kidney disease: Secondary | ICD-10-CM

## 2014-05-20 DIAGNOSIS — E1149 Type 2 diabetes mellitus with other diabetic neurological complication: Secondary | ICD-10-CM

## 2014-05-20 DIAGNOSIS — N183 Chronic kidney disease, stage 3 unspecified: Secondary | ICD-10-CM

## 2014-05-20 DIAGNOSIS — N189 Chronic kidney disease, unspecified: Secondary | ICD-10-CM | POA: Diagnosis not present

## 2014-05-20 DIAGNOSIS — E1169 Type 2 diabetes mellitus with other specified complication: Secondary | ICD-10-CM | POA: Diagnosis not present

## 2014-05-20 DIAGNOSIS — F329 Major depressive disorder, single episode, unspecified: Secondary | ICD-10-CM | POA: Diagnosis not present

## 2014-05-20 DIAGNOSIS — J449 Chronic obstructive pulmonary disease, unspecified: Secondary | ICD-10-CM | POA: Diagnosis not present

## 2014-05-20 DIAGNOSIS — I69359 Hemiplegia and hemiparesis following cerebral infarction affecting unspecified side: Secondary | ICD-10-CM | POA: Diagnosis not present

## 2014-05-20 DIAGNOSIS — E785 Hyperlipidemia, unspecified: Secondary | ICD-10-CM | POA: Diagnosis not present

## 2014-05-20 DIAGNOSIS — K5909 Other constipation: Secondary | ICD-10-CM

## 2014-05-20 DIAGNOSIS — F32A Depression, unspecified: Secondary | ICD-10-CM

## 2014-05-20 DIAGNOSIS — E1140 Type 2 diabetes mellitus with diabetic neuropathy, unspecified: Secondary | ICD-10-CM

## 2014-05-20 DIAGNOSIS — I69320 Aphasia following cerebral infarction: Secondary | ICD-10-CM

## 2014-06-16 ENCOUNTER — Non-Acute Institutional Stay (SKILLED_NURSING_FACILITY): Payer: Medicare Other | Admitting: Adult Health

## 2014-06-16 DIAGNOSIS — D631 Anemia in chronic kidney disease: Secondary | ICD-10-CM

## 2014-06-16 DIAGNOSIS — E1140 Type 2 diabetes mellitus with diabetic neuropathy, unspecified: Secondary | ICD-10-CM | POA: Diagnosis not present

## 2014-06-16 DIAGNOSIS — J449 Chronic obstructive pulmonary disease, unspecified: Secondary | ICD-10-CM

## 2014-06-16 DIAGNOSIS — F32A Depression, unspecified: Secondary | ICD-10-CM

## 2014-06-16 DIAGNOSIS — E785 Hyperlipidemia, unspecified: Secondary | ICD-10-CM | POA: Diagnosis not present

## 2014-06-16 DIAGNOSIS — I69359 Hemiplegia and hemiparesis following cerebral infarction affecting unspecified side: Secondary | ICD-10-CM | POA: Diagnosis not present

## 2014-06-16 DIAGNOSIS — I69320 Aphasia following cerebral infarction: Secondary | ICD-10-CM | POA: Diagnosis not present

## 2014-06-16 DIAGNOSIS — K5900 Constipation, unspecified: Secondary | ICD-10-CM

## 2014-06-16 DIAGNOSIS — E1149 Type 2 diabetes mellitus with other diabetic neurological complication: Secondary | ICD-10-CM

## 2014-06-16 DIAGNOSIS — E1169 Type 2 diabetes mellitus with other specified complication: Secondary | ICD-10-CM

## 2014-06-16 DIAGNOSIS — N189 Chronic kidney disease, unspecified: Secondary | ICD-10-CM

## 2014-06-16 DIAGNOSIS — F329 Major depressive disorder, single episode, unspecified: Secondary | ICD-10-CM

## 2014-06-16 DIAGNOSIS — N183 Chronic kidney disease, stage 3 unspecified: Secondary | ICD-10-CM

## 2014-06-16 DIAGNOSIS — K5909 Other constipation: Secondary | ICD-10-CM

## 2014-06-17 LAB — HEMOGLOBIN A1C: Hgb A1c MFr Bld: 7.6 % — AB (ref 4.0–6.0)

## 2014-07-02 LAB — BASIC METABOLIC PANEL
BUN: 42 mg/dL — AB (ref 4–21)
Creatinine: 3.3 mg/dL — AB (ref 0.6–1.3)
GLUCOSE: 190 mg/dL
Potassium: 4.5 mmol/L (ref 3.4–5.3)
Sodium: 145 mmol/L (ref 137–147)

## 2014-07-02 LAB — CBC AND DIFFERENTIAL
HEMATOCRIT: 35 % — AB (ref 41–53)
Hemoglobin: 10.8 g/dL — AB (ref 13.5–17.5)
NEUTROS ABS: 19 /uL
Platelets: 200 10*3/uL (ref 150–399)
WBC: 10.8 10*3/mL

## 2014-07-03 ENCOUNTER — Encounter: Payer: Self-pay | Admitting: Adult Health

## 2014-07-03 DIAGNOSIS — K5909 Other constipation: Secondary | ICD-10-CM | POA: Insufficient documentation

## 2014-07-03 NOTE — Progress Notes (Signed)
Patient ID: Justin MiniumCharles Sherman, male   DOB: 05/13/1940, 74 y.o.   MRN: [id number]  [facility]     No Known Allergies     Chief Complaint  Patient presents with  . Medical Management of Chronic Issues    HPI:  He is a long term resident of this facility being seen for the management of his chronic illnesses. Overall there is change in his status. He cannot fully participate in the hpi or ros but states that he is feeling good. There are no nursing concerns at this time.    Past Medical History  Diagnosis Date  . 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 Acute conjunctivitis, unspecified   . Aortic aneurysm of unspecified site without mention of rupture   . Flaccid hemiplegia affecting dominant side   . Type II or unspecified type diabetes mellitus without mention of complication, not stated as uncontrolled   . Depressive disorder, not elsewhere classified   . Unspecified essential hypertension   . Obstructive chronic bronchitis with exacerbation   . Anxiety state, unspecified   . Esophageal reflux   . Other and unspecified hyperlipidemia   . Pressure ulcer, other site(707.09)   . Dementia   . Chronic kidney disease 08/04/2011    Acute renal failure per note 08/04/2011-Dr. Art Chilton [place]  . Stroke     Past Surgical History  Procedure Laterality Date  . Cystoscopy w/ ureteral stent placement  08/07/2011    Procedure: CYSTOSCOPY WITH RETROGRADE PYELOGRAM/URETERAL STENT PLACEMENT;  Surgeon: Kathi LudwigSigmund I Tannenbaum, MD;  Location: [facility];  Service: Urology;  Laterality: Bilateral;  bilateral stent placement ureterosopy  . Cystoscopy/retrograde/ureteroscopy  12/07/2011    Procedure: CYSTOSCOPY/RETROGRADE/URETEROSCOPY;  Surgeon: Kathi LudwigSigmund I Tannenbaum, MD;  Location: [facility];  Service: Urology;  Laterality: Bilateral;  . Cystoscopy w/ ureteral stent placement  12/07/2011    Procedure: CYSTOSCOPY WITH STENT REPLACEMENT;  Surgeon: Kathi LudwigSigmund I Tannenbaum, MD;   Location: [facility];  Service: Urology;  Laterality: Bilateral;  (BIL) JJ STENT EXCHANGE  . Cystoscopy with urethral dilatation  12/07/2011    Procedure: CYSTOSCOPY WITH URETHRAL DILATATION;  Surgeon: Kathi LudwigSigmund I Tannenbaum, MD;  Location: [facility];  Service: Urology;;    VITAL SIGNS BP 130/68 mmHg  Pulse 68  Ht 5\' 9"  (1.753 m)  Wt 172 lb (78.019 kg)  BMI 25.39 kg/m2  SpO2 97%   Outpatient Encounter Prescriptions as of 05/20/2014  Medication Sig  . acetaminophen (TYLENOL) 500 MG tablet Take 500 mg by mouth every 8 (eight) hours as needed (for pain).  Marland Kitchen. albuterol (PROVENTIL) (2.5 MG/3ML) 0.083% nebulizer solution Take 2.5 mg by nebulization every 2 (two) hours as needed for wheezing.  Marland Kitchen. atorvastatin (LIPITOR) 20 MG tablet Take 20 mg by mouth at bedtime.  . citalopram (CELEXA) 20 MG tablet Take 20 mg by mouth at bedtime.   . ferrous sulfate 325 (65 FE) MG tablet Take 325 mg by mouth daily with breakfast.   . Fluticasone-Salmeterol (ADVAIR) 500-50 MCG/DOSE AEPB Inhale 1 puff into the lungs 2 (two) times daily.  Marland Kitchen. guaiFENesin (MUCINEX) 600 MG 12 hr tablet Take 600 mg by mouth 2 (two) times daily.   . insulin aspart (NOVOLOG) 100 UNIT/ML injection Inject 5 Units into the skin 4 (four) times daily -  before meals and at bedtime. Give for CBG > 150. Before meals and at bedtime  . insulin glargine (LANTUS) 100 UNIT/ML injection Inject 8 Units into the skin at bedtime.   Marland Kitchen. ipratropium-albuterol (DUONEB) 0.5-2.5 (3) MG/3ML  SOLN Take 3 mLs by nebulization every 6 (six) hours as needed. For shortness of breath  . Multiple Vitamins-Minerals (MULTIVITAMINS THER. W/MINERALS) TABS Take 1 tablet by mouth every morning.   . polyethylene glycol (MIRALAX / GLYCOLAX) packet Take 17 g by mouth every morning.   . thiamine 100 MG tablet Take 100 mg by mouth every morning.      SIGNIFICANT DIAGNOSTIC EXAMS   LABS REVIEWED:   10-21-13: wbc 13.;4 hgb 9.6; hct 32.7; mcv 87.6; plt 247; glucose 176; bun 28.9; creat  2.1;k+4.5; na++141; liver normal albumin 3.3 hgb a1c 7.8; chol 130; ldl 49; trig 257 01-28-14: wbc 11.6; hgb 9.6; hct 32.5 ;mcv 86.9; plt 250 hgb a1c 7.6  03-14-14: chol 120; ldl 48; trig 201; hdl 32       ROS Unable to perform ROS   Physical Exam Constitutional: He appears well-developed and well-nourished. No distress.  Neck: Neck supple. No JVD present. No thyromegaly present.  Cardiovascular: Normal rate, regular rhythm and intact distal pulses.   Respiratory: Breath sounds normal. No respiratory distress. He has no wheezes.  GI: Soft. Bowel sounds are normal. He exhibits no distension. There is no tenderness.  Musculoskeletal: He exhibits no edema.  Right side hemiparesis   Neurological: He is alert.  Skin: Skin is warm. He is not diaphoretic.     ASSESSMENT/ PLAN:   1. Copd: he is presently stable ; he does use 02; will continue advair 500/50 twice daily; mucinex twice daily  has albuterol neb every 2 hours as needed and duoneb every 6 hours as needed will monitor  2. Dyslipidemia: will continue lipitor 20 mg daily ldl is 48   3. Anemia: will continue iron daily   4. Diabetes: is stable will continue lantus 8 units daily; and novolog 5 units prior to meals for cbg >=150  5. Depression: is stable will continue celexa 20 mg daily   6. Constipation; will continue miralax daily   7. ckd stage III: no change in status most recent creat is 2.1  8. CVA; with right hemiparesis: is neurologically stable; will not make changes will monitor     Synthia Innocent NP [facility]  Contact [phone] Monday through Friday 8am- 5pm  After hours call [phone]

## 2014-07-07 ENCOUNTER — Non-Acute Institutional Stay (SKILLED_NURSING_FACILITY): Payer: Medicare Other | Admitting: Adult Health

## 2014-07-07 DIAGNOSIS — N183 Chronic kidney disease, stage 3 unspecified: Secondary | ICD-10-CM

## 2014-07-07 DIAGNOSIS — N179 Acute kidney failure, unspecified: Secondary | ICD-10-CM | POA: Diagnosis not present

## 2014-07-07 DIAGNOSIS — N201 Calculus of ureter: Secondary | ICD-10-CM | POA: Diagnosis not present

## 2014-07-13 ENCOUNTER — Non-Acute Institutional Stay (SKILLED_NURSING_FACILITY): Payer: Medicare Other | Admitting: Adult Health

## 2014-07-13 DIAGNOSIS — E1149 Type 2 diabetes mellitus with other diabetic neurological complication: Secondary | ICD-10-CM

## 2014-07-13 DIAGNOSIS — J449 Chronic obstructive pulmonary disease, unspecified: Secondary | ICD-10-CM

## 2014-07-13 DIAGNOSIS — E1169 Type 2 diabetes mellitus with other specified complication: Secondary | ICD-10-CM

## 2014-07-13 DIAGNOSIS — E785 Hyperlipidemia, unspecified: Secondary | ICD-10-CM | POA: Diagnosis not present

## 2014-07-13 DIAGNOSIS — N183 Chronic kidney disease, stage 3 unspecified: Secondary | ICD-10-CM

## 2014-07-13 DIAGNOSIS — I69359 Hemiplegia and hemiparesis following cerebral infarction affecting unspecified side: Secondary | ICD-10-CM | POA: Diagnosis not present

## 2014-07-13 DIAGNOSIS — N201 Calculus of ureter: Secondary | ICD-10-CM | POA: Diagnosis not present

## 2014-07-13 DIAGNOSIS — I69320 Aphasia following cerebral infarction: Secondary | ICD-10-CM | POA: Diagnosis not present

## 2014-07-13 DIAGNOSIS — E1140 Type 2 diabetes mellitus with diabetic neuropathy, unspecified: Secondary | ICD-10-CM | POA: Diagnosis not present

## 2014-07-14 LAB — PSA: PSA: 1.78

## 2014-07-23 NOTE — Progress Notes (Signed)
Patient ID: Justin Sherman, male   DOB: [birth year], 74 y.o.   MRN: [id number]   [facility]     No Known Allergies     Chief Complaint  Patient presents with  . Medical Management of Chronic Issues    HPI:  He is a long term resident of this facility being seen for the management of his chronic illnesses. Overall his status remains without change. He is not voicing any complaints or concerns today. There are no nursing concerns today.    Past Medical History  Diagnosis Date  . 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 Acute conjunctivitis, unspecified   . Aortic aneurysm of unspecified site without mention of rupture   . Flaccid hemiplegia affecting dominant side   . Type II or unspecified type diabetes mellitus without mention of complication, not stated as uncontrolled   . Depressive disorder, not elsewhere classified   . Unspecified essential hypertension   . Obstructive chronic bronchitis with exacerbation   . Anxiety state, unspecified   . Esophageal reflux   . Other and unspecified hyperlipidemia   . Pressure ulcer, other site(707.09)   . Dementia   . Chronic kidney disease 08/04/2011    Acute renal failure per note 08/04/2011-Dr. Art Chilton [place]  . Stroke     Past Surgical History  Procedure Laterality Date  . Cystoscopy w/ ureteral stent placement  08/07/2011    Procedure: CYSTOSCOPY WITH RETROGRADE PYELOGRAM/URETERAL STENT PLACEMENT;  Surgeon: Justin Ludwig, MD;  Location: [facility];  Service: Urology;  Laterality: Bilateral;  bilateral stent placement ureterosopy  . Cystoscopy/retrograde/ureteroscopy  12/07/2011    Procedure: CYSTOSCOPY/RETROGRADE/URETEROSCOPY;  Surgeon: Justin Ludwig, MD;  Location: [facility];  Service: Urology;  Laterality: Bilateral;  . Cystoscopy w/ ureteral stent placement  12/07/2011    Procedure: CYSTOSCOPY WITH STENT REPLACEMENT;  Surgeon: Justin Ludwig, MD;  Location: [facility];  Service: Urology;   Laterality: Bilateral;  (BIL) JJ STENT EXCHANGE  . Cystoscopy with urethral dilatation  12/07/2011    Procedure: CYSTOSCOPY WITH URETHRAL DILATATION;  Surgeon: Justin Ludwig, MD;  Location: [facility];  Service: Urology;;    VITAL SIGNS BP 132/70 mmHg  Pulse 68  Ht  (1.753 m)  Wt 175 lb (79.379 kg)  BMI 25.83 kg/m2   Outpatient Encounter Prescriptions as of 06/16/2014  Medication Sig  . acetaminophen (TYLENOL) 500 MG tablet Take 500 mg by mouth every 8 (eight) hours as needed (for pain).  Marland Kitchen albuterol (PROVENTIL) (2.5 MG/3ML) 0.083% nebulizer solution Take 2.5 mg by nebulization every 2 (two) hours as needed for wheezing.  Marland Kitchen atorvastatin (LIPITOR) 20 MG tablet Take 20 mg by mouth at bedtime.  . citalopram (CELEXA) 20 MG tablet Take 20 mg by mouth at bedtime.   . ferrous sulfate 325 (65 FE) MG tablet Take 325 mg by mouth daily with breakfast.   . Fluticasone-Salmeterol (ADVAIR) 500-50 MCG/DOSE AEPB Inhale 1 puff into the lungs 2 (two) times daily.  Marland Kitchen guaiFENesin (MUCINEX) 600 MG 12 hr tablet Take 600 mg by mouth 2 (two) times daily.   . insulin aspart (NOVOLOG) 100 UNIT/ML injection Inject 5 Units into the skin 4 (four) times daily -  before meals and at bedtime. Give for CBG > 150. Before meals and at bedtime  . insulin glargine (LANTUS) 100 UNIT/ML injection Inject 8 Units into the skin at bedtime.   Marland Kitchen ipratropium-albuterol (DUONEB) 0.5-2.5 (3) MG/3ML SOLN Take 3 mLs by nebulization every 6 (six) hours as needed.  For shortness of breath  . Multiple Vitamins-Minerals (MULTIVITAMINS THER. W/MINERALS) TABS Take 1 tablet by mouth every morning.   . polyethylene glycol (MIRALAX / GLYCOLAX) packet Take 17 g by mouth every morning.   . thiamine 100 MG tablet Take 100 mg by mouth every morning.      SIGNIFICANT DIAGNOSTIC EXAMS    LABS REVIEWED:   10-21-13: wbc 13.;4 hgb 9.6; hct 32.7; mcv 87.6; plt 247; glucose 176; bun 28.9; creat 2.1;k+4.5; na++141; liver normal albumin 3.3 hgb  a1c 7.8; chol 130; ldl 49; trig 257 01-28-14: wbc 11.6; hgb 9.6; hct 32.5 ;mcv 86.9; plt 250 hgb a1c 7.6  03-14-14: chol 120; ldl 48; trig 201; hdl 32      ROS Unable to perform ROS   Physical Exam Constitutional: He appears well-developed and well-nourished. No distress.  Neck: Neck supple. No JVD present. No thyromegaly present.  Cardiovascular: Normal rate, regular rhythm and intact distal pulses.   Respiratory: Breath sounds normal. No respiratory distress. He has no wheezes.  GI: Soft. Bowel sounds are normal. He exhibits no distension. There is no tenderness.  Musculoskeletal: He exhibits no edema.  Right side hemiparesis   Neurological: He is alert.  Skin: Skin is warm. He is not diaphoretic.      ASSESSMENT/ PLAN:   1. Copd: he is presently stable ; he does use 02; will continue advair 500/50 twice daily; mucinex twice daily  has albuterol neb every 2 hours as needed and duoneb every 6 hours as needed will monitor  2. Dyslipidemia: will continue lipitor 20 mg daily ldl is 48   3. Anemia: will continue iron daily   4. Diabetes: is stable will continue lantus 8 units daily; and novolog 5 units prior to meals for cbg >=150  5. Depression: is stable will continue celexa 20 mg daily   6. Constipation; will continue miralax daily   7. ckd stage III: no change in status most recent creat is 2.1  8. CVA; with right hemiparesis: is neurologically stable; will not make changes will monitor      Justin Innocenteborah Green NP [facility]  Contact [phone] Monday through Friday 8am- 5pm  After hours call [phone]

## 2014-08-06 ENCOUNTER — Encounter: Payer: Self-pay | Admitting: *Deleted

## 2014-08-08 DIAGNOSIS — N184 Chronic kidney disease, stage 4 (severe): Secondary | ICD-10-CM

## 2014-08-08 DIAGNOSIS — N179 Acute kidney failure, unspecified: Secondary | ICD-10-CM | POA: Insufficient documentation

## 2014-08-08 NOTE — Progress Notes (Signed)
Patient ID: Justin Sherman, male   DOB: [birth year], 74 y.o.   MRN: [id number]  [facility]     No Known Allergies     Chief Complaint  Patient presents with  . Acute Visit    patient status     HPI:  His po intake is poor. He is present on doxycycline for possible uti. His wbc is elevate at 21.8. In the past he has had this type of reaction when he is having an issue with with renal stones. His lab work does demonstrate acute on chronic renal failure. He is unable to fully participate in the hpi or ros; but states that he is not feeling good.    Past Medical History  Diagnosis Date  . 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 Acute conjunctivitis, unspecified   . Aortic aneurysm of unspecified site without mention of rupture   . Flaccid hemiplegia affecting dominant side   . Type II or unspecified type diabetes mellitus without mention of complication, not stated as uncontrolled   . Depressive disorder, not elsewhere classified   . Unspecified essential hypertension   . Obstructive chronic bronchitis with exacerbation   . Anxiety state, unspecified   . Esophageal reflux   . Other and unspecified hyperlipidemia   . Pressure ulcer, other site(707.09)   . Dementia   . Chronic kidney disease 08/04/2011    Acute renal failure per note 08/04/2011-Dr. Art Chilton [place]  . Stroke     Past Surgical History  Procedure Laterality Date  . Cystoscopy w/ ureteral stent placement  08/07/2011    Procedure: CYSTOSCOPY WITH RETROGRADE PYELOGRAM/URETERAL STENT PLACEMENT;  Surgeon: Justin Ludwig, MD;  Location: [facility];  Service: Urology;  Laterality: Bilateral;  bilateral stent placement ureterosopy  . Cystoscopy/retrograde/ureteroscopy  12/07/2011    Procedure: CYSTOSCOPY/RETROGRADE/URETEROSCOPY;  Surgeon: Justin Ludwig, MD;  Location: [facility];  Service: Urology;  Laterality: Bilateral;  . Cystoscopy w/ ureteral stent placement  12/07/2011    Procedure:  CYSTOSCOPY WITH STENT REPLACEMENT;  Surgeon: Justin Ludwig, MD;  Location: [facility];  Service: Urology;  Laterality: Bilateral;  (BIL) JJ STENT EXCHANGE  . Cystoscopy with urethral dilatation  12/07/2011    Procedure: CYSTOSCOPY WITH URETHRAL DILATATION;  Surgeon: Justin Ludwig, MD;  Location: [facility];  Service: Urology;;    VITAL SIGNS BP 125/67 mmHg  Pulse 89  Ht  (1.753 m)  Wt 175 lb (79.379 kg)  BMI 25.83 kg/m2  SpO2 96%     Outpatient Encounter Prescriptions as of 07/07/2014  Medication Sig  . acetaminophen (TYLENOL) 500 MG tablet Take 500 mg by mouth every 8 (eight) hours as needed (for pain).  Marland Kitchen albuterol (PROVENTIL) (2.5 MG/3ML) 0.083% nebulizer solution Take 2.5 mg by nebulization every 2 (two) hours as needed for wheezing.  Marland Kitchen atorvastatin (LIPITOR) 20 MG tablet Take 20 mg by mouth at bedtime.  . citalopram (CELEXA) 20 MG tablet Take 20 mg by mouth at bedtime.   . ferrous sulfate 325 (65 FE) MG tablet Take 325 mg by mouth daily with breakfast.   . Fluticasone-Salmeterol (ADVAIR) 500-50 MCG/DOSE AEPB Inhale 1 puff into the lungs 2 (two) times daily.  Marland Kitchen guaiFENesin (MUCINEX) 600 MG 12 hr tablet Take 600 mg by mouth 2 (two) times daily.   . insulin aspart (NOVOLOG) 100 UNIT/ML injection Inject 5 Units into the skin 4 (four) times daily -  before meals and at bedtime. Give for CBG > 150. Before meals and at bedtime  .  insulin glargine (LANTUS) 100 UNIT/ML injection Inject 8 Units into the skin at bedtime.   Marland Kitchen. ipratropium-albuterol (DUONEB) 0.5-2.5 (3) MG/3ML SOLN Take 3 mLs by nebulization every 6 (six) hours as needed. For shortness of breath  . Multiple Vitamins-Minerals (MULTIVITAMINS THER. W/MINERALS) TABS Take 1 tablet by mouth every morning.   . polyethylene glycol (MIRALAX / GLYCOLAX) packet Take 17 g by mouth every morning.   . thiamine 100 MG tablet Take 100 mg by mouth every morning.      SIGNIFICANT DIAGNOSTIC EXAMS  07-02-14: chest x-ray: no acute  disease process    LABS REVIEWED:   10-21-13: wbc 13.;4 hgb 9.6; hct 32.7; mcv 87.6; plt 247; glucose 176; bun 28.9; creat 2.1;k+4.5; na++141; liver normal albumin 3.3 hgb a1c 7.8; chol 130; ldl 49; trig 257 01-28-14: wbc 11.6; hgb 9.6; hct 32.5 ;mcv 86.9; plt 250 hgb a1c 7.6  03-14-14: chol 120; ldl 48; trig 201; hdl 32  1-61-093-10-16: wbc 12.4; hgb 10.1; hct 35.4; mcv 88.2; plt 212; glucose 140; bun 33.4; creat 2.13; k+4.5; na++137; liver normal albumin 3.4; hgb a1c 7.6 07-02-14: wbc 21.8; hgb 10.8; hct 35.2; mcv 87.4; plt 200; glucose 190; bun 41.8; creat 3.34; k+4.5; na++145       ROS Unable to perform ROS    Physical Exam Constitutional: He appears well-developed and well-nourished. No distress.  Neck: Neck supple. No JVD present. No thyromegaly present.  Cardiovascular: Normal rate, regular rhythm and intact distal pulses.   Respiratory: Breath sounds normal. No respiratory distress. He has no wheezes.  GI: Soft. Bowel sounds are normal. He exhibits no distension. There is no tenderness.  Musculoskeletal: He exhibits no edema.  Right side hemiparesis   Neurological: He is alert.  Skin: Skin is warm. He is not diaphoretic.       ASSESSMENT/ PLAN:  1. Renal stones 2. Acute on chronic renal failure  Will recheck cbc; will get blood cultures X2 sites to look for sepsis. Will get a Ua/c&s. Will begin 1/2 NS at 100 cc per hour for 2 liters to help with his renal function and hydration and will repeat a bmp. Will get a kub to look at renal stones. Will extended his doxycycline out for total of 14 days. Will monitor his status    Justin InnocentDeborah Green NP [facility]  Contact [phone] Monday through Friday 8am- 5pm  After hours call [phone]

## 2014-08-23 ENCOUNTER — Non-Acute Institutional Stay (SKILLED_NURSING_FACILITY): Payer: Medicare Other | Admitting: Internal Medicine

## 2014-08-23 ENCOUNTER — Encounter: Payer: Self-pay | Admitting: Internal Medicine

## 2014-08-23 DIAGNOSIS — N183 Chronic kidney disease, stage 3 unspecified: Secondary | ICD-10-CM

## 2014-08-23 DIAGNOSIS — E1169 Type 2 diabetes mellitus with other specified complication: Secondary | ICD-10-CM | POA: Diagnosis not present

## 2014-08-23 DIAGNOSIS — E1140 Type 2 diabetes mellitus with diabetic neuropathy, unspecified: Secondary | ICD-10-CM | POA: Diagnosis not present

## 2014-08-23 DIAGNOSIS — F329 Major depressive disorder, single episode, unspecified: Secondary | ICD-10-CM

## 2014-08-23 DIAGNOSIS — F0390 Unspecified dementia without behavioral disturbance: Secondary | ICD-10-CM

## 2014-08-23 DIAGNOSIS — F32A Depression, unspecified: Secondary | ICD-10-CM

## 2014-08-23 DIAGNOSIS — J449 Chronic obstructive pulmonary disease, unspecified: Secondary | ICD-10-CM | POA: Diagnosis not present

## 2014-08-23 DIAGNOSIS — I69320 Aphasia following cerebral infarction: Secondary | ICD-10-CM

## 2014-08-23 DIAGNOSIS — E1149 Type 2 diabetes mellitus with other diabetic neurological complication: Secondary | ICD-10-CM

## 2014-08-23 DIAGNOSIS — E785 Hyperlipidemia, unspecified: Secondary | ICD-10-CM | POA: Diagnosis not present

## 2014-08-23 DIAGNOSIS — I69359 Hemiplegia and hemiparesis following cerebral infarction affecting unspecified side: Secondary | ICD-10-CM | POA: Diagnosis not present

## 2014-08-23 NOTE — Progress Notes (Signed)
Patient ID: Justin MiniumCharles Sherman, male   DOB: 03/15/1941, 74 y.o.   MRN: [id number]    DATE: 08/23/14  Location[facility]  [facility]    Place of Service: SNF [phone]   Extended Emergency Contact Information Primary Emergency Contact: Justin Sherman,Justin Sherman          [place], [place] [address] [place] Home Phone: [phone] Relation: Son  Advanced Directive information  FULL CODE  Chief Complaint  Patient presents with  . Medical Management of Chronic Issues    HPI:  74 yo male long term resident seen today for f/u  DM - CBGs 120-170s, occasionally 180-250s. No low BS reactions. He is taking lantus and novolog  COPD - stable with no recent exacerbations. He uses Advair and nebs as well as mucinex   Dementia - stable. He takes thiamine daily.   Depression/anxiety - mood stable on citalopram  Hx CVA - stable. No new deficits. He takes lipitor  Urinary retention - he takes cranberry tabs  Anemia - stable on iron supplement  CKD - Cr has worsened over the years but no AKI at this time  Past Medical History  Diagnosis Date  . 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 Acute conjunctivitis, unspecified   . Aortic aneurysm of unspecified site without mention of rupture   . Flaccid hemiplegia affecting dominant side   . Type II or unspecified type diabetes mellitus without mention of complication, not stated as uncontrolled   . Depressive disorder, not elsewhere classified   . Unspecified essential hypertension   . Obstructive chronic bronchitis with exacerbation   . Anxiety state, unspecified   . Esophageal reflux   . Other and unspecified hyperlipidemia   . Pressure ulcer, other site(707.09)   . Dementia   . Chronic kidney disease 08/04/2011    Acute renal failure per note 08/04/2011-Dr. Art Chilton SiGreen  . Stroke     Past Surgical History  Procedure Laterality Date  . Cystoscopy w/ ureteral stent placement  08/07/2011   Procedure: CYSTOSCOPY WITH RETROGRADE PYELOGRAM/URETERAL STENT PLACEMENT;  Surgeon: Kathi LudwigSigmund I Tannenbaum, MD;  Location: [facility];  Service: Urology;  Laterality: Bilateral;  bilateral stent placement ureterosopy  . Cystoscopy/retrograde/ureteroscopy  12/07/2011    Procedure: CYSTOSCOPY/RETROGRADE/URETEROSCOPY;  Surgeon: Kathi LudwigSigmund I Tannenbaum, MD;  Location: [facility];  Service: Urology;  Laterality: Bilateral;  . Cystoscopy w/ ureteral stent placement  12/07/2011    Procedure: CYSTOSCOPY WITH STENT REPLACEMENT;  Surgeon: Kathi LudwigSigmund I Tannenbaum, MD;  Location: [facility];  Service: Urology;  Laterality: Bilateral;  (BIL) JJ STENT EXCHANGE  . Cystoscopy with urethral dilatation  12/07/2011    Procedure: CYSTOSCOPY WITH URETHRAL DILATATION;  Surgeon: Kathi LudwigSigmund I Tannenbaum, MD;  Location: [facility];  Service: Urology;;    Patient Care Team: Justin BoysMonica Jamya Starry, DO as PCP - General (Internal Medicine) Justin Holstereborah S Green, NP as Nurse Practitioner (Nurse Practitioner)  History   Social History  . Marital Status: Married    Spouse Name: N/A  . Number of Children: N/A  . Years of Education: N/A   Occupational History  . Not on file.   Social History Main Topics  . Smoking status: Never Smoker   . Smokeless tobacco: Not on file  . Alcohol Use: No  . Drug Use: No  . Sexual Activity: Not on file   Other Topics Concern  . Not on file   Social History Narrative     reports that he has never smoked. He does not have any  smokeless tobacco history on file. He reports that he does not drink alcohol or use illicit drugs.  Immunization History  Administered Date(s) Administered  . Influenza Whole 01/28/2013  . Pneumococcal-Unspecified 04/30/2011    No Known Allergies  Medications: Patient's Medications  New Prescriptions   No medications on file  Previous Medications   ACETAMINOPHEN (TYLENOL) 500 MG TABLET    Take 500 mg by mouth every 8 (eight) hours as needed (for pain).   ALBUTEROL (PROVENTIL) (2.5 MG/3ML)  0.083% NEBULIZER SOLUTION    Take 2.5 mg by nebulization every 2 (two) hours as needed for wheezing.   ATORVASTATIN (LIPITOR) 20 MG TABLET    Take 20 mg by mouth at bedtime. For hyperlipidemia   CITALOPRAM (CELEXA) 20 MG TABLET    Take 20 mg by mouth at bedtime. For depression   FERROUS SULFATE 325 (65 FE) MG TABLET    Take 325 mg by mouth daily with breakfast. For anemia   FLUTICASONE-SALMETEROL (ADVAIR) 500-50 MCG/DOSE AEPB    Inhale 1 puff into the lungs 2 (two) times daily.   GUAIFENESIN (MUCINEX) 600 MG 12 HR TABLET    Take 600 mg by mouth 2 (two) times daily. For chronic bronchitis   INSULIN ASPART (NOVOLOG) 100 UNIT/ML INJECTION    Inject 5 Units into the skin 4 (four) times daily -  before meals and at bedtime. Give for CBG > 150. Before meals and at bedtime   INSULIN GLARGINE (LANTUS) 100 UNIT/ML INJECTION    Inject 8 Units into the skin at bedtime. For DM   IPRATROPIUM-ALBUTEROL (DUONEB) 0.5-2.5 (3) MG/3ML SOLN    Take 3 mLs by nebulization every 6 (six) hours as needed. For shortness of breath   MULTIPLE VITAMINS-MINERALS (MULTIVITAMINS THER. W/MINERALS) TABS    Take 1 tablet by mouth every morning.    POLYETHYLENE GLYCOL (MIRALAX / GLYCOLAX) PACKET    Take 17 g by mouth every morning. For constipation   THIAMINE 100 MG TABLET    Take 100 mg by mouth every morning.   Modified Medications   No medications on file  Discontinued Medications   No medications on file    Review of Systems  Unable to perform ROS: Dementia    Filed Vitals:   08/23/14 1749  BP: 136/77  Pulse: 78  Temp: 98.1 F (36.7 C)  SpO2: 99%   There is no weight on file to calculate BMI.  Physical Exam  Constitutional: He appears well-developed. No distress.  Frail appearing in NAD  HENT:  Mouth/Throat: Oropharynx is clear and moist.  Eyes: Pupils are equal, round, and reactive to light. No scleral icterus.  Neck: Neck supple. Carotid bruit is not present. No thyromegaly present.  Cardiovascular: Normal  rate, regular rhythm, normal heart sounds and intact distal pulses.  Exam reveals no gallop and no friction rub.   No murmur heard. no distal LE swelling. No calf TTP  Pulmonary/Chest: Effort normal and breath sounds normal. He has no wheezes. He has no rales. He exhibits no tenderness.  Abdominal: Soft. Bowel sounds are normal. He exhibits no distension, no abdominal bruit, no pulsatile midline mass and no mass. There is no tenderness. There is no rebound and no guarding.  Musculoskeletal: He exhibits edema and tenderness.  Lymphadenopathy:    He has no cervical adenopathy.  Neurological: He is alert.  Right hemiparesis  Skin: Skin is warm and dry. No rash noted.  No foot leions  Psychiatric: He has a normal mood and affect. His behavior  is normal.     Labs reviewed: CBC Latest Ref Rng 07/02/2014 01/28/2014 10/21/2013  WBC - 10.8 11.6 13.4  Hemoglobin 13.5 - 17.5 g/dL 10.8(A) 9.6(A) 9.6(A)  Hematocrit 41 - 53 % 35(A) 32(A) 33(A)  Platelets 150 - 399 K/L 200 250 247    CMP Latest Ref Rng 07/02/2014 10/21/2013 08/20/2012  Glucose 70 - 99 mg/dL 409190 - 811(B114(H)  BUN 4 - 21 mg/dL 14(N42(A) 82(N28(A) 56(O30(H)  Creatinine 0.6 - 1.3 mg/dL 3.3(A) 2.1(A) 2.02(H)  Sodium 137 - 147 mmol/L 145 141 136  Potassium 3.4 - 5.3 mmol/L 4.5 4.5 4.2  Chloride 96 - 112 mEq/L 101 - 100  CO2 19 - 32 mEq/L 23 - 30  Calcium 8.4 - 10.5 mg/dL - - 9.4  Total Protein 6.0 - 8.3 g/dL - - -  Total Bilirubin 0.3 - 1.2 mg/dL - - -  Alkaline Phos 39 - 117 U/L - - -  AST 0 - 37 U/L - - -  ALT 0 - 53 U/L - - -    HgbA1c 7.6 % (06/17/14)  PSA  1.78 (07/14/14)  Nursing Home on 04/15/2014  Component Date Value Ref Range Status  . Hemoglobin 01/28/2014 9.6* 13.5 - 17.5 g/dL Final  . HCT 13/08/657810/22/2015 32* 41 - 53 % Final  . Neutrophils Absolute 01/28/2014 8   Final  . Platelets 01/28/2014 250  150 - 399 K/L Final  . WBC 01/28/2014 11.6   Final  . Triglycerides 03/14/2014 201* 40 - 160 mg/dL Final  . Cholesterol 46/96/295212/09/2013 120  0  - 200 mg/dL Final  . HDL 84/13/244012/09/2013 32* 35 - 70 mg/dL Final  . LDL Cholesterol 03/14/2014 48   Final  . Hgb A1c MFr Bld 01/28/2014 7.6* 4.0 - 6.0 % Final    No results found.   Assessment/Plan   ICD-9-CM ICD-10-CM   1. Type 2 diabetes mellitus with neurological manifestations, controlled (HCC) - borderline controlled 250.60 E11.40   2. Dementia, without behavioral disturbance - stable 294.20 F03.90   3. Depression - stable 311 F32.9   4. COPD, moderate (HCC) - stable 496 J44.9   5. 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 Hyperlipidemia associated with type 2 diabetes mellitus (HCC) - stable 250.80 E11.69    272.4 E78.5   7. CKD (chronic kidney disease) stage 3, GFR 30-59 ml/min - stable 585.3 N18.3     --f/u with urology to monitor urinary retention and renal stone(s)  --Pt is medically stable on current tx plan. Continue current medications as ordered. PT/OT/ST as indicated. Will follow  Rachana Malesky S. Ancil Linseyarter, D. O., F. A. C. O. I.  [facility] [address] [place], [place] [address] [phone] Cell (Monday-Friday 8 AM - 5 PM) [phone] After 5 PM and follow prompts

## 2014-09-02 NOTE — Progress Notes (Signed)
Patient ID: Justin Sherman, male   DOB: 08/13/1940, 74 y.o.   MRN: [id number]  [facility]     No Known Allergies     Chief Complaint  Patient presents with  . Annual Exam    HPI:  He is a long term resident of this facility being seen for his annual review. He has been seen by urology regarding his renal stones. His family discussed his options have decided upon surgical intervention. He will need to be seen by cardiology for surgical clearance. He is unable to fully participate in the hpi or ros. There are no nursing concerns today. He has not been hospitalized in the past year.    Past Medical History  Diagnosis Date  . 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 Acute conjunctivitis, unspecified   . Aortic aneurysm of unspecified site without mention of rupture   . Flaccid hemiplegia affecting dominant side   . Type II or unspecified type diabetes mellitus without mention of complication, not stated as uncontrolled   . Depressive disorder, not elsewhere classified   . Unspecified essential hypertension   . Obstructive chronic bronchitis with exacerbation   . Anxiety state, unspecified   . Esophageal reflux   . Other and unspecified hyperlipidemia   . Pressure ulcer, other site(707.09)   . Dementia   . Chronic kidney disease 08/04/2011    Acute renal failure per note 08/04/2011-Dr. Art Chilton Si  . Stroke     Past Surgical History  Procedure Laterality Date  . Cystoscopy w/ ureteral stent placement  08/07/2011    Procedure: CYSTOSCOPY WITH RETROGRADE PYELOGRAM/URETERAL STENT PLACEMENT;  Surgeon: Kathi Ludwig, MD;  Location: [facility];  Service: Urology;  Laterality: Bilateral;  bilateral stent placement ureterosopy  . Cystoscopy/retrograde/ureteroscopy  12/07/2011    Procedure: CYSTOSCOPY/RETROGRADE/URETEROSCOPY;  Surgeon: Kathi Ludwig, MD;  Location: [facility];  Service: Urology;  Laterality: Bilateral;  . Cystoscopy w/ ureteral  stent placement  12/07/2011    Procedure: CYSTOSCOPY WITH STENT REPLACEMENT;  Surgeon: Kathi Ludwig, MD;  Location: [facility];  Service: Urology;  Laterality: Bilateral;  (BIL) JJ STENT EXCHANGE  . Cystoscopy with urethral dilatation  12/07/2011    Procedure: CYSTOSCOPY WITH URETHRAL DILATATION;  Surgeon: Kathi Ludwig, MD;  Location: [facility];  Service: Urology;;    VITAL SIGNS BP 138/70 mmHg  Pulse 70  Ht  (1.753 m)  Wt 173 lb (78.472 kg)  BMI 25.54 kg/m2  SpO2 98%   Outpatient Encounter Prescriptions as of 07/13/2014  Medication Sig   . acetaminophen (TYLENOL) 500 MG tablet Take 500 mg by mouth every 8 (eight) hours as needed (for pain).  Marland Kitchen albuterol (PROVENTIL) (2.5 MG/3ML) 0.083% nebulizer solution Take 2.5 mg by nebulization every 2 (two) hours as needed for wheezing.  Marland Kitchen atorvastatin (LIPITOR) 20 MG tablet Take 20 mg by mouth at bedtime.  . citalopram (CELEXA) 20 MG tablet Take 20 mg by mouth at bedtime.   . ferrous sulfate 325 (65 FE) MG tablet Take 325 mg by mouth daily with breakfast.   . Fluticasone-Salmeterol (ADVAIR) 500-50 MCG/DOSE AEPB Inhale 1 puff into the lungs 2 (two) times daily.  Marland Kitchen guaiFENesin (MUCINEX) 600 MG 12 hr tablet Take 600 mg by mouth 2 (two) times daily.   . insulin aspart (NOVOLOG) 100 UNIT/ML injection Inject 5 Units into the skin 4 (four) times daily -  before meals and at bedtime. Give for CBG > 150. Before meals and at bedtime  . insulin  glargine (LANTUS) 100 UNIT/ML injection Inject 8 Units into the skin at bedtime.   Marland Kitchen. ipratropium-albuterol (DUONEB) 0.5-2.5 (3) MG/3ML SOLN Take 3 mLs by nebulization every 6 (six) hours as needed. For shortness of breath  . Multiple Vitamins-Minerals (MULTIVITAMINS THER. W/MINERALS) TABS Take 1 tablet by mouth every morning.   . polyethylene glycol (MIRALAX / GLYCOLAX) packet Take 17 g by mouth every morning.   . thiamine 100 MG tablet Take 100 mg by mouth every morning.       SIGNIFICANT DIAGNOSTIC  EXAMS   07-02-14: chest x-ray: no acute disease process    LABS REVIEWED:   10-21-13: wbc 13.;4 hgb 9.6; hct 32.7; mcv 87.6; plt 247; glucose 176; bun 28.9; creat 2.1;k+4.5; na++141; liver normal albumin 3.3 hgb a1c 7.8; chol 130; ldl 49; trig 257 01-28-14: wbc 11.6; hgb 9.6; hct 32.5 ;mcv 86.9; plt 250 hgb a1c 7.6  03-14-14: chol 120; ldl 48; trig 201; hdl 32  0-45-403-10-16: wbc 12.4; hgb 10.1; hct 35.4; mcv 88.2; plt 212; glucose 140; bun 33.4; creat 2.13; k+4.5; na++137; liver normal albumin 3.4; hgb a1c 7.6 07-02-14: wbc 21.8; hgb 10.8; hct 35.2; mcv 87.4; plt 200; glucose 190; bun 41.8; creat 3.34; k+4.5; na++145 07-07-14: wbc 12.2; hgb 10.3; hct 33.6; mcv 85.9; plt 254; glucose 136; bun 46; creat 2.45; k+4.6; na++135     ROS Unable to perform ROS    Physical Exam Constitutional: He appears well-developed and well-nourished. No distress.  Neck: Neck supple. No JVD present. No thyromegaly present.  Cardiovascular: Normal rate, regular rhythm and intact distal pulses.   Respiratory: Breath sounds normal. No respiratory distress. He has no wheezes.  GI: Soft. Bowel sounds are normal. He exhibits no distension. There is no tenderness.  Musculoskeletal: He exhibits no edema.  Right side hemiparesis   Neurological: He is alert.  Skin: Skin is warm. He is not diaphoretic.     ASSESSMENT/ PLAN:   1. Copd: he is presently stable ; he does use 02; will continue advair 500/50 twice daily; mucinex twice daily  has albuterol neb every 2 hours as needed and duoneb every 6 hours as needed will monitor  2. Dyslipidemia: will continue lipitor 20 mg daily ldl is 48   3. Anemia: will continue iron daily his hgb is 10.3   4. Diabetes: is stable will continue lantus 8 units daily; and novolog 5 units prior to meals for cbg >=150 his hgb a1c is 7.6  5. Depression: is stable will continue celexa 20 mg daily   6. Constipation; will continue miralax daily   7. ckd stage III: no change in status  most recent creat is 2.45  8. 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 Renal stone: he has been seen by urology; will monitor his status.     Will check urine for micro-albumin and will check psa   Synthia Innocenteborah Astor Gentle NP [facility]  Contact [phone] Monday through Friday 8am- 5pm  After hours call [phone]

## 2014-09-29 ENCOUNTER — Non-Acute Institutional Stay (SKILLED_NURSING_FACILITY): Payer: Medicare Other | Admitting: Adult Health

## 2014-09-29 DIAGNOSIS — I69320 Aphasia following cerebral infarction: Secondary | ICD-10-CM | POA: Diagnosis not present

## 2014-09-29 DIAGNOSIS — N183 Chronic kidney disease, stage 3 unspecified: Secondary | ICD-10-CM

## 2014-09-29 DIAGNOSIS — E1169 Type 2 diabetes mellitus with other specified complication: Secondary | ICD-10-CM | POA: Diagnosis not present

## 2014-09-29 DIAGNOSIS — K5909 Other constipation: Secondary | ICD-10-CM

## 2014-09-29 DIAGNOSIS — F329 Major depressive disorder, single episode, unspecified: Secondary | ICD-10-CM

## 2014-09-29 DIAGNOSIS — K5900 Constipation, unspecified: Secondary | ICD-10-CM | POA: Diagnosis not present

## 2014-09-29 DIAGNOSIS — N189 Chronic kidney disease, unspecified: Secondary | ICD-10-CM

## 2014-09-29 DIAGNOSIS — D631 Anemia in chronic kidney disease: Secondary | ICD-10-CM

## 2014-09-29 DIAGNOSIS — E1149 Type 2 diabetes mellitus with other diabetic neurological complication: Secondary | ICD-10-CM

## 2014-09-29 DIAGNOSIS — E785 Hyperlipidemia, unspecified: Secondary | ICD-10-CM

## 2014-09-29 DIAGNOSIS — I69359 Hemiplegia and hemiparesis following cerebral infarction affecting unspecified side: Secondary | ICD-10-CM | POA: Diagnosis not present

## 2014-09-29 DIAGNOSIS — E1140 Type 2 diabetes mellitus with diabetic neuropathy, unspecified: Secondary | ICD-10-CM

## 2014-09-29 DIAGNOSIS — F32A Depression, unspecified: Secondary | ICD-10-CM

## 2014-09-29 DIAGNOSIS — J449 Chronic obstructive pulmonary disease, unspecified: Secondary | ICD-10-CM

## 2014-11-02 ENCOUNTER — Non-Acute Institutional Stay (SKILLED_NURSING_FACILITY): Payer: Medicare Other | Admitting: Adult Health

## 2014-11-02 DIAGNOSIS — E785 Hyperlipidemia, unspecified: Secondary | ICD-10-CM

## 2014-11-02 DIAGNOSIS — K5909 Other constipation: Secondary | ICD-10-CM

## 2014-11-02 DIAGNOSIS — E1140 Type 2 diabetes mellitus with diabetic neuropathy, unspecified: Secondary | ICD-10-CM

## 2014-11-02 DIAGNOSIS — E1149 Type 2 diabetes mellitus with other diabetic neurological complication: Secondary | ICD-10-CM

## 2014-11-02 DIAGNOSIS — K5900 Constipation, unspecified: Secondary | ICD-10-CM

## 2014-11-02 DIAGNOSIS — I69320 Aphasia following cerebral infarction: Secondary | ICD-10-CM | POA: Diagnosis not present

## 2014-11-02 DIAGNOSIS — I69359 Hemiplegia and hemiparesis following cerebral infarction affecting unspecified side: Secondary | ICD-10-CM | POA: Diagnosis not present

## 2014-11-02 DIAGNOSIS — D631 Anemia in chronic kidney disease: Secondary | ICD-10-CM

## 2014-11-02 DIAGNOSIS — N183 Chronic kidney disease, stage 3 unspecified: Secondary | ICD-10-CM

## 2014-11-02 DIAGNOSIS — N189 Chronic kidney disease, unspecified: Secondary | ICD-10-CM | POA: Diagnosis not present

## 2014-11-02 DIAGNOSIS — J449 Chronic obstructive pulmonary disease, unspecified: Secondary | ICD-10-CM | POA: Diagnosis not present

## 2014-11-02 DIAGNOSIS — N201 Calculus of ureter: Secondary | ICD-10-CM

## 2014-11-02 DIAGNOSIS — E1169 Type 2 diabetes mellitus with other specified complication: Secondary | ICD-10-CM | POA: Diagnosis not present

## 2014-12-07 ENCOUNTER — Encounter: Payer: Self-pay | Admitting: Adult Health

## 2014-12-07 NOTE — Progress Notes (Signed)
Patient ID: Justin Sherman, male   DOB: 1940/10/20, 74 y.o.   MRN: [id number]   Facility: [facility]      No Known Allergies  Chief Complaint  Patient presents with  . Medical Management of Chronic Issues    HPI:  He is a long term resident of this facility being seen for the management of his chronic illnesses. Overall there is little change in his status. He is unable to fully participate in the hpi or ros. There are no nursing concerns at this time.    Past Medical History  Diagnosis Date  . 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 Acute conjunctivitis, unspecified   . Aortic aneurysm of unspecified site without mention of rupture   . Flaccid hemiplegia affecting dominant side   . Type II or unspecified type diabetes mellitus without mention of complication, not stated as uncontrolled   . Depressive disorder, not elsewhere classified   . Unspecified essential hypertension   . Obstructive chronic bronchitis with exacerbation   . Anxiety state, unspecified   . Esophageal reflux   . Other and unspecified hyperlipidemia   . Pressure ulcer, other site(707.09)   . Dementia   . Chronic kidney disease 08/04/2011    Acute renal failure per note 08/04/2011-Dr. Art Chilton Si  . Stroke     Past Surgical History  Procedure Laterality Date  . Cystoscopy w/ ureteral stent placement  08/07/2011    Procedure: CYSTOSCOPY WITH RETROGRADE PYELOGRAM/URETERAL STENT PLACEMENT;  Surgeon: Kathi Ludwig, MD;  Location: [facility];  Service: Urology;  Laterality: Bilateral;  bilateral stent placement ureterosopy  . Cystoscopy/retrograde/ureteroscopy  12/07/2011    Procedure: CYSTOSCOPY/RETROGRADE/URETEROSCOPY;  Surgeon: Kathi Ludwig, MD;  Location: [facility];  Service: Urology;  Laterality: Bilateral;  . Cystoscopy w/ ureteral stent placement  12/07/2011    Procedure: CYSTOSCOPY WITH STENT REPLACEMENT;  Surgeon: Kathi Ludwig, MD;   Location: [facility];  Service: Urology;  Laterality: Bilateral;  (BIL) JJ STENT EXCHANGE  . Cystoscopy with urethral dilatation  12/07/2011    Procedure: CYSTOSCOPY WITH URETHRAL DILATATION;  Surgeon: Kathi Ludwig, MD;  Location: [facility];  Service: Urology;;    VITAL SIGNS BP 130/70 mmHg  Pulse 76  Ht  (1.753 m)  Wt 170 lb (77.111 kg)  BMI 25.09 kg/m2  SpO2 98%  Patient's Medications  New Prescriptions   No medications on file  Previous Medications   ACETAMINOPHEN (TYLENOL) 500 MG TABLET    Take 500 mg by mouth every 8 (eight) hours as needed (for pain).   ALBUTEROL (PROVENTIL) (2.5 MG/3ML) 0.083% NEBULIZER SOLUTION    Take 2.5 mg by nebulization every 2 (two) hours as needed for wheezing.   ATORVASTATIN (LIPITOR) 20 MG TABLET    Take 20 mg by mouth at bedtime. For hyperlipidemia   CITALOPRAM (CELEXA) 20 MG TABLET    Take 20 mg by mouth at bedtime. For depression   CRANBERRY 450 MG CAPS    Take 450 mg by mouth 2 (two) times daily.   FERROUS SULFATE 325 (65 FE) MG TABLET    Take 325 mg by mouth daily with breakfast. For anemia   FLUTICASONE-SALMETEROL (ADVAIR) 500-50 MCG/DOSE AEPB    Inhale 1 puff into the lungs 2 (two) times daily.   GUAIFENESIN (MUCINEX) 600 MG 12 HR TABLET    Take 600 mg by mouth 2 (two) times daily. For chronic bronchitis   INSULIN ASPART (NOVOLOG) 100 UNIT/ML INJECTION    Inject  5 Units into the skin 4 (four) times daily -  before meals and at bedtime. Give for CBG > 150. Before meals and at bedtime   INSULIN GLARGINE (LANTUS) 100 UNIT/ML INJECTION    Inject 8 Units into the skin at bedtime. For DM   IPRATROPIUM-ALBUTEROL (DUONEB) 0.5-2.5 (3) MG/3ML SOLN    Take 3 mLs by nebulization every 6 (six) hours as needed. For shortness of breath   MULTIPLE VITAMINS-MINERALS (MULTIVITAMINS THER. W/MINERALS) TABS    Take 1 tablet by mouth every morning.    POLYETHYLENE GLYCOL (MIRALAX / GLYCOLAX) PACKET    Take 17 g by mouth every morning. For constipation   THIAMINE  100 MG TABLET    Take 100 mg by mouth every morning.   Modified Medications   No medications on file  Discontinued Medications   No medications on file     SIGNIFICANT DIAGNOSTIC EXAMS   07-02-14: chest x-ray: no acute disease process    LABS REVIEWED:   10-21-13: wbc 13.;4 hgb 9.6; hct 32.7; mcv 87.6; plt 247; glucose 176; bun 28.9; creat 2.1;k+4.5; na++141; liver normal albumin 3.3 hgb a1c 7.8; chol 130; ldl 49; trig 257 01-28-14: wbc 11.6; hgb 9.6; hct 32.5 ;mcv 86.9; plt 250 hgb a1c 7.6  03-14-14: chol 120; ldl 48; trig 201; hdl 32  6-96-29: wbc 12.4; hgb 10.1; hct 35.4; mcv 88.2; plt 212; glucose 140; bun 33.4; creat 2.13; k+4.5; na++137; liver normal albumin 3.4; hgb a1c 7.6 07-02-14: wbc 21.8; hgb 10.8; hct 35.2; mcv 87.4; plt 200; glucose 190; bun 41.8; creat 3.34; k+4.5; na++145 07-07-14: wbc 12.2; hgb 10.3; hct 33.6; mcv 85.9; plt 254; glucose 136; bun 46; creat 2.45; k+4.6; na++135    Review of Systems  Unable to perform ROS: Dementia      Physical Exam  Constitutional: No distress.  Eyes: Conjunctivae are normal.  Neck: Neck supple. No JVD present. No thyromegaly present.  Cardiovascular: Normal rate, regular rhythm and intact distal pulses.   Respiratory: Effort normal and breath sounds normal. No respiratory distress. He has no wheezes.  GI: Soft. Bowel sounds are normal. He exhibits no distension. There is no tenderness.  Musculoskeletal: He exhibits no edema.  Has right hemiparesis   Lymphadenopathy:    He has no cervical adenopathy.  Neurological: He is alert.  Skin: Skin is warm and dry. He is not diaphoretic.  Psychiatric: He has a normal mood and affect.       ASSESSMENT/ PLAN:  1. Copd: he is presently stable ; he does use 02; will continue advair 500/50 twice daily; mucinex twice daily  has albuterol neb every 2 hours as needed and duoneb every 6 hours as needed will monitor  2. Dyslipidemia: will continue lipitor 20 mg daily ldl is 48   3.  Anemia: will continue iron daily his hgb is 10.3   4. Diabetes: is stable will continue lantus 8 units daily; and novolog 5 units prior to meals for cbg >=150 his hgb a1c is 7.6  5. Depression: is stable will continue celexa 20 mg daily   6. Constipation; will continue miralax daily   7. ckd stage III: no change in status most recent creat is 2.45  8. 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 Renal stone: he has been seen by urology; will monitor his status.      Synthia Innocent NP [facility]  Contact [phone] Monday through Friday 8am- 5pm  After  hours call [phone]

## 2014-12-09 ENCOUNTER — Non-Acute Institutional Stay (SKILLED_NURSING_FACILITY): Payer: Medicare Other | Admitting: Adult Health

## 2014-12-09 DIAGNOSIS — E1140 Type 2 diabetes mellitus with diabetic neuropathy, unspecified: Secondary | ICD-10-CM

## 2014-12-09 DIAGNOSIS — I69320 Aphasia following cerebral infarction: Secondary | ICD-10-CM | POA: Diagnosis not present

## 2014-12-09 DIAGNOSIS — D631 Anemia in chronic kidney disease: Secondary | ICD-10-CM

## 2014-12-09 DIAGNOSIS — K5909 Other constipation: Secondary | ICD-10-CM

## 2014-12-09 DIAGNOSIS — N183 Chronic kidney disease, stage 3 unspecified: Secondary | ICD-10-CM

## 2014-12-09 DIAGNOSIS — K5900 Constipation, unspecified: Secondary | ICD-10-CM | POA: Diagnosis not present

## 2014-12-09 DIAGNOSIS — I69359 Hemiplegia and hemiparesis following cerebral infarction affecting unspecified side: Secondary | ICD-10-CM

## 2014-12-09 DIAGNOSIS — E785 Hyperlipidemia, unspecified: Secondary | ICD-10-CM

## 2014-12-09 DIAGNOSIS — N189 Chronic kidney disease, unspecified: Secondary | ICD-10-CM | POA: Diagnosis not present

## 2014-12-09 DIAGNOSIS — E1149 Type 2 diabetes mellitus with other diabetic neurological complication: Secondary | ICD-10-CM

## 2014-12-09 DIAGNOSIS — N201 Calculus of ureter: Secondary | ICD-10-CM | POA: Diagnosis not present

## 2014-12-09 DIAGNOSIS — E1169 Type 2 diabetes mellitus with other specified complication: Secondary | ICD-10-CM | POA: Diagnosis not present

## 2014-12-09 DIAGNOSIS — J449 Chronic obstructive pulmonary disease, unspecified: Secondary | ICD-10-CM

## 2014-12-30 ENCOUNTER — Encounter: Payer: Self-pay | Admitting: Adult Health

## 2014-12-30 NOTE — Progress Notes (Signed)
Patient ID: Justin Sherman, male   DOB: 10-30-1940, 74 y.o.   MRN: [id number]    Facility: [facility]      No Known Allergies  Chief Complaint  Patient presents with  . Medical Management of Chronic Issues    HPI:  He is a long term resident of this facility being seen for the management of his chronic illnesses. Overall his status remains stable. He cannot fully participate in the hpi or ros; but states that he is feeling good. There are no nursing concerns at this time.     Past Medical History  Diagnosis Date  . 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 Acute conjunctivitis, unspecified   . Aortic aneurysm of unspecified site without mention of rupture   . Flaccid hemiplegia affecting dominant side   . Type II or unspecified type diabetes mellitus without mention of complication, not stated as uncontrolled   . Depressive disorder, not elsewhere classified   . Unspecified essential hypertension   . Obstructive chronic bronchitis with exacerbation   . Anxiety state, unspecified   . Esophageal reflux   . Other and unspecified hyperlipidemia   . Pressure ulcer, other site(707.09)   . Dementia   . Chronic kidney disease 08/04/2011    Acute renal failure per note 08/04/2011-Dr. Art Chilton Si  . Stroke     Past Surgical History  Procedure Laterality Date  . Cystoscopy w/ ureteral stent placement  08/07/2011    Procedure: CYSTOSCOPY WITH RETROGRADE PYELOGRAM/URETERAL STENT PLACEMENT;  Surgeon: Kathi Ludwig, MD;  Location: [facility];  Service: Urology;  Laterality: Bilateral;  bilateral stent placement ureterosopy  . Cystoscopy/retrograde/ureteroscopy  12/07/2011    Procedure: CYSTOSCOPY/RETROGRADE/URETEROSCOPY;  Surgeon: Kathi Ludwig, MD;  Location: [facility];  Service: Urology;  Laterality: Bilateral;  . Cystoscopy w/ ureteral stent placement  12/07/2011    Procedure: CYSTOSCOPY WITH STENT REPLACEMENT;  Surgeon: Kathi Ludwig, MD;  Location: [facility];  Service: Urology;  Laterality: Bilateral;  (BIL) JJ STENT EXCHANGE  . Cystoscopy with urethral dilatation  12/07/2011    Procedure: CYSTOSCOPY WITH URETHRAL DILATATION;  Surgeon: Kathi Ludwig, MD;  Location: [facility];  Service: Urology;;    VITAL SIGNS BP 138/72 mmHg  Pulse 74  Ht  (1.753 m)  Wt 172 lb (78.019 kg)  BMI 25.39 kg/m2  SpO2 98%  Patient's Medications  New Prescriptions   No medications on file  Previous Medications   ACETAMINOPHEN (TYLENOL) 500 MG TABLET    Take 500 mg by mouth every 8 (eight) hours as needed (for pain).   ALBUTEROL (PROVENTIL) (2.5 MG/3ML) 0.083% NEBULIZER SOLUTION    Take 2.5 mg by nebulization every 2 (two) hours as needed for wheezing.   ATORVASTATIN (LIPITOR) 20 MG TABLET    Take 20 mg by mouth at bedtime. For hyperlipidemia   CITALOPRAM (CELEXA) 20 MG TABLET    Take 20 mg by mouth at bedtime. For depression   CRANBERRY 450 MG CAPS    Take 450 mg by mouth 2 (two) times daily.   FERROUS SULFATE 325 (65 FE) MG TABLET    Take 325 mg by mouth daily with breakfast. For anemia   FLUTICASONE-SALMETEROL (ADVAIR) 500-50 MCG/DOSE AEPB    Inhale 1 puff into the lungs 2 (two) times daily.   GUAIFENESIN (MUCINEX) 600 MG 12 HR TABLET    Take 600 mg by mouth 2 (two) times daily. For chronic bronchitis   INSULIN ASPART (NOVOLOG) 100 UNIT/ML INJECTION  Inject 5 Units into the skin 4 (four) times daily -  before meals and at bedtime. Give for CBG > 150. Before meals and at bedtime   INSULIN GLARGINE (LANTUS) 100 UNIT/ML INJECTION    Inject 8 Units into the skin at bedtime. For DM   IPRATROPIUM-ALBUTEROL (DUONEB) 0.5-2.5 (3) MG/3ML SOLN    Take 3 mLs by nebulization every 6 (six) hours as needed. For shortness of breath   MULTIPLE VITAMINS-MINERALS (MULTIVITAMINS THER. W/MINERALS) TABS    Take 1 tablet by mouth every morning.    POLYETHYLENE GLYCOL (MIRALAX / GLYCOLAX) PACKET    Take 17 g by mouth every morning. For  constipation   THIAMINE 100 MG TABLET    Take 100 mg by mouth every morning.   Modified Medications   No medications on file  Discontinued Medications   No medications on file     SIGNIFICANT DIAGNOSTIC EXAMS  07-02-14: chest x-ray: no acute disease process    LABS REVIEWED:   01-28-14: wbc 11.6; hgb 9.6; hct 32.5 ;mcv 86.9; plt 250 hgb a1c 7.6  03-14-14: chol 120; ldl 48; trig 201; hdl 32  9-60-45: wbc 12.4; hgb 10.1; hct 35.4; mcv 88.2; plt 212; glucose 140; bun 33.4; creat 2.13; k+4.5; na++137; liver normal albumin 3.4; hgb a1c 7.6 07-02-14: wbc 21.8; hgb 10.8; hct 35.2; mcv 87.4; plt 200; glucose 190; bun 41.8; creat 3.34; k+4.5; na++145 07-07-14: wbc 12.2; hgb 10.3; hct 33.6; mcv 85.9; plt 254; glucose 136; bun 46; creat 2.45; k+4.6; na++135   Review of Systems Unable to perform ROS: Dementia     Physical Exam Constitutional: No distress.  Eyes: Conjunctivae are normal.  Neck: Neck supple. No JVD present. No thyromegaly present.  Cardiovascular: Normal rate, regular rhythm and intact distal pulses.   Respiratory: Effort normal and breath sounds normal. No respiratory distress. He has no wheezes.  GI: Soft. Bowel sounds are normal. He exhibits no distension. There is no tenderness.  Musculoskeletal: He exhibits no edema.  Has right hemiparesis   Lymphadenopathy:    He has no cervical adenopathy.  Neurological: He is alert.  Skin: Skin is warm and dry. He is not diaphoretic.  Psychiatric: He has a normal mood and affect.    ASSESSMENT/ PLAN:  1. Copd: he is presently stable ; he does use 02; will continue advair 500/50 twice daily; mucinex twice daily  has albuterol neb every 2 hours as needed and duoneb every 6 hours as needed will monitor  2. Dyslipidemia: will continue lipitor 20 mg daily ldl is 48   3. Anemia: will continue iron daily his hgb is 10.3   4. Diabetes: is stable will continue lantus 8 units daily; and novolog 5 units prior to meals for cbg >=150 his  hgb a1c is 7.6  5. Depression: is stable will continue celexa 20 mg daily   6. Constipation; will continue miralax daily   7. ckd stage III: no change in status most recent creat is 2.45  8. 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 Renal stone: no recent symptoms present; he has been seen by urology; will monitor his status.   Will check hgb a1c lipids; urine for micro-albumin      Synthia Innocent NP [facility]  Contact [phone] Monday through Friday 8am- 5pm  After hours call [phone]

## 2014-12-30 NOTE — Progress Notes (Signed)
Patient ID: Justin Sherman, male   DOB: [birth year], 74 y.o.   MRN: [id number]    Facility: [facility]      No Known Allergies  Chief Complaint  Patient presents with  . Medical Management of Chronic Issues    HPI:  He is a long term resident of this facility being seen for the management of his chronic illnesses. Overall his status is without change. He is unable to fully participate in the hpi or ros; but states that he is feeling good. He has not had recent symptoms of renal stone issues. There are no nursing concerns at this time   Past Medical History  Diagnosis Date  . 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 Acute conjunctivitis, unspecified   . Aortic aneurysm of unspecified site without mention of rupture   . Flaccid hemiplegia affecting dominant side   . Type II or unspecified type diabetes mellitus without mention of complication, not stated as uncontrolled   . Depressive disorder, not elsewhere classified   . Unspecified essential hypertension   . Obstructive chronic bronchitis with exacerbation   . Anxiety state, unspecified   . Esophageal reflux   . Other and unspecified hyperlipidemia   . Pressure ulcer, other site(707.09)   . Dementia   . Chronic kidney disease 08/04/2011    Acute renal failure per note 08/04/2011-Dr. Art Chilton Si  . Stroke   . Type 2 diabetes mellitus with neurological manifestations, controlled     Past Surgical History  Procedure Laterality Date  . Cystoscopy w/ ureteral stent placement  08/07/2011    Procedure: CYSTOSCOPY WITH RETROGRADE PYELOGRAM/URETERAL STENT PLACEMENT;  Surgeon: Kathi Ludwig, MD;  Location: [facility];  Service: Urology;  Laterality: Bilateral;  bilateral stent placement ureterosopy  . Cystoscopy/retrograde/ureteroscopy  12/07/2011    Procedure: CYSTOSCOPY/RETROGRADE/URETEROSCOPY;  Surgeon: Kathi Ludwig, MD;  Location: [facility];  Service: Urology;  Laterality:  Bilateral;  . Cystoscopy w/ ureteral stent placement  12/07/2011    Procedure: CYSTOSCOPY WITH STENT REPLACEMENT;  Surgeon: Kathi Ludwig, MD;  Location: [facility];  Service: Urology;  Laterality: Bilateral;  (BIL) JJ STENT EXCHANGE  . Cystoscopy with urethral dilatation  12/07/2011    Procedure: CYSTOSCOPY WITH URETHRAL DILATATION;  Surgeon: Kathi Ludwig, MD;  Location: [facility];  Service: Urology;;    VITAL SIGNS BP 124/62 mmHg  Pulse 75  Ht  (1.753 m)  Wt 171 lb (77.565 kg)  BMI 25.24 kg/m2  SpO2 97%  Patient's Medications  New Prescriptions   No medications on file  Previous Medications   ACETAMINOPHEN (TYLENOL) 500 MG TABLET    Take 500 mg by mouth every 8 (eight) hours as needed (for pain).   ALBUTEROL (PROVENTIL) (2.5 MG/3ML) 0.083% NEBULIZER SOLUTION    Take 2.5 mg by nebulization every 2 (two) hours as needed for wheezing.   ATORVASTATIN (LIPITOR) 20 MG TABLET    Take 20 mg by mouth at bedtime. For hyperlipidemia   CITALOPRAM (CELEXA) 20 MG TABLET    Take 20 mg by mouth at bedtime. For depression   CRANBERRY 450 MG CAPS    Take 450 mg by mouth 2 (two) times daily.   FERROUS SULFATE 325 (65 FE) MG TABLET    Take 325 mg by mouth daily with breakfast. For anemia   FLUTICASONE-SALMETEROL (ADVAIR) 500-50 MCG/DOSE AEPB    Inhale 1 puff into the lungs 2 (two) times daily.   GUAIFENESIN (MUCINEX) 600 MG 12 HR TABLET  Take 600 mg by mouth 2 (two) times daily. For chronic bronchitis   INSULIN ASPART (NOVOLOG) 100 UNIT/ML INJECTION    Inject 5 Units into the skin 4 (four) times daily -  before meals and at bedtime. Give for CBG > 150. Before meals and at bedtime   INSULIN GLARGINE (LANTUS) 100 UNIT/ML INJECTION    Inject 8 Units into the skin at bedtime. For DM   IPRATROPIUM-ALBUTEROL (DUONEB) 0.5-2.5 (3) MG/3ML SOLN    Take 3 mLs by nebulization every 6 (six) hours as needed. For shortness of breath   MULTIPLE VITAMINS-MINERALS (MULTIVITAMINS THER. W/MINERALS) TABS     Take 1 tablet by mouth every morning.    POLYETHYLENE GLYCOL (MIRALAX / GLYCOLAX) PACKET    Take 17 g by mouth every morning. For constipation   THIAMINE 100 MG TABLET    Take 100 mg by mouth every morning.   Modified Medications   No medications on file  Discontinued Medications   No medications on file     SIGNIFICANT DIAGNOSTIC EXAMS   07-02-14: chest x-ray: no acute disease process    LABS REVIEWED:   01-28-14: wbc 11.6; hgb 9.6; hct 32.5 ;mcv 86.9; plt 250 hgb a1c 7.6  03-14-14: chol 120; ldl 48; trig 201; hdl 32  07-17-79: wbc 12.4; hgb 10.1; hct 35.4; mcv 88.2; plt 212; glucose 140; bun 33.4; creat 2.13; k+4.5; na++137; liver normal albumin 3.4; hgb a1c 7.6 07-02-14: wbc 21.8; hgb 10.8; hct 35.2; mcv 87.4; plt 200; glucose 190; bun 41.8; creat 3.34; k+4.5; na++145 07-07-14: wbc 12.2; hgb 10.3; hct 33.6; mcv 85.9; plt 254; glucose 136; bun 46; creat 2.45; k+4.6; na++135 11-03-14: hgb a1c 7.4; chol 144; ldl 73; trig 193; hdl 32     Review of Systems Unable to perform ROS: Dementia    Physical Exam Constitutional: No distress.  Eyes: Conjunctivae are normal.  Neck: Neck supple. No JVD present. No thyromegaly present.  Cardiovascular: Normal rate, regular rhythm and intact distal pulses.   Respiratory: Effort normal and breath sounds normal. No respiratory distress. He has no wheezes.  GI: Soft. Bowel sounds are normal. He exhibits no distension. There is no tenderness.  Musculoskeletal: He exhibits no edema.  Has right hemiparesis   Lymphadenopathy:    He has no cervical adenopathy.  Neurological: He is alert.  Skin: Skin is warm and dry. He is not diaphoretic.  Psychiatric: He has a normal mood and affect.     ASSESSMENT/ PLAN:  1. Copd: he is presently stable ; he does use 02; will continue advair 500/50 twice daily; mucinex twice daily  has albuterol neb every 2 hours as needed and duoneb every 6 hours as needed will monitor  2. Dyslipidemia: will continue lipitor  20 mg daily ldl is 73   3. Anemia: will continue iron daily his hgb is 10.3   4. Diabetes: is stable will continue lantus 8 units daily; and novolog 5 units prior to meals for cbg >=150 his hgb a1c is 7.4  5. Depression: is stable will continue celexa 20 mg daily   6. Constipation; will continue miralax daily   7. ckd stage III: no change in status most recent creat is 2.45  8. 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 Renal stone: no recent symptoms present; he has been seen by urology; will monitor his status.    Will check cbc; cmp; hgb a1c       Synthia Innocent  NP [facility]  Contact [phone] Monday through Friday 8am- 5pm  After hours call [phone]

## 2015-01-10 ENCOUNTER — Non-Acute Institutional Stay (SKILLED_NURSING_FACILITY): Payer: Medicare Other | Admitting: Internal Medicine

## 2015-01-10 DIAGNOSIS — D631 Anemia in chronic kidney disease: Secondary | ICD-10-CM

## 2015-01-10 DIAGNOSIS — F0390 Unspecified dementia without behavioral disturbance: Secondary | ICD-10-CM

## 2015-01-10 DIAGNOSIS — I69359 Hemiplegia and hemiparesis following cerebral infarction affecting unspecified side: Secondary | ICD-10-CM

## 2015-01-10 DIAGNOSIS — E1149 Type 2 diabetes mellitus with other diabetic neurological complication: Secondary | ICD-10-CM

## 2015-01-10 DIAGNOSIS — K219 Gastro-esophageal reflux disease without esophagitis: Secondary | ICD-10-CM

## 2015-01-10 DIAGNOSIS — J449 Chronic obstructive pulmonary disease, unspecified: Secondary | ICD-10-CM

## 2015-01-10 DIAGNOSIS — N189 Chronic kidney disease, unspecified: Secondary | ICD-10-CM

## 2015-01-10 DIAGNOSIS — E785 Hyperlipidemia, unspecified: Secondary | ICD-10-CM | POA: Diagnosis not present

## 2015-01-10 DIAGNOSIS — I69320 Aphasia following cerebral infarction: Secondary | ICD-10-CM

## 2015-01-10 DIAGNOSIS — F418 Other specified anxiety disorders: Secondary | ICD-10-CM

## 2015-01-10 DIAGNOSIS — I6930 Unspecified sequelae of cerebral infarction: Secondary | ICD-10-CM | POA: Diagnosis not present

## 2015-01-13 ENCOUNTER — Encounter: Payer: Self-pay | Admitting: Internal Medicine

## 2015-01-13 DIAGNOSIS — F0390 Unspecified dementia without behavioral disturbance: Secondary | ICD-10-CM | POA: Insufficient documentation

## 2015-02-15 ENCOUNTER — Encounter: Payer: Self-pay | Admitting: Adult Health

## 2015-02-15 ENCOUNTER — Non-Acute Institutional Stay (SKILLED_NURSING_FACILITY): Payer: Medicare Other | Admitting: Adult Health

## 2015-02-15 DIAGNOSIS — I69320 Aphasia following cerebral infarction: Secondary | ICD-10-CM | POA: Diagnosis not present

## 2015-02-15 DIAGNOSIS — E785 Hyperlipidemia, unspecified: Secondary | ICD-10-CM

## 2015-02-15 DIAGNOSIS — N183 Chronic kidney disease, stage 3 unspecified: Secondary | ICD-10-CM

## 2015-02-15 DIAGNOSIS — N189 Chronic kidney disease, unspecified: Secondary | ICD-10-CM

## 2015-02-15 DIAGNOSIS — N201 Calculus of ureter: Secondary | ICD-10-CM | POA: Diagnosis not present

## 2015-02-15 DIAGNOSIS — E1169 Type 2 diabetes mellitus with other specified complication: Secondary | ICD-10-CM

## 2015-02-15 DIAGNOSIS — F0150 Vascular dementia without behavioral disturbance: Secondary | ICD-10-CM | POA: Insufficient documentation

## 2015-02-15 DIAGNOSIS — J449 Chronic obstructive pulmonary disease, unspecified: Secondary | ICD-10-CM | POA: Diagnosis not present

## 2015-02-15 DIAGNOSIS — F329 Major depressive disorder, single episode, unspecified: Secondary | ICD-10-CM | POA: Diagnosis not present

## 2015-02-15 DIAGNOSIS — E1149 Type 2 diabetes mellitus with other diabetic neurological complication: Secondary | ICD-10-CM

## 2015-02-15 DIAGNOSIS — I69359 Hemiplegia and hemiparesis following cerebral infarction affecting unspecified side: Secondary | ICD-10-CM

## 2015-02-15 DIAGNOSIS — F32A Depression, unspecified: Secondary | ICD-10-CM

## 2015-02-15 DIAGNOSIS — D631 Anemia in chronic kidney disease: Secondary | ICD-10-CM

## 2015-02-15 NOTE — Progress Notes (Signed)
Patient ID: Justin MiniumCharles Sherman, male   DOB: 02/03/1941, 74 y.o.   MRN: [id number]    Facility[facility]      No Known Allergies  Chief Complaint  Patient presents with  . Medical Management of Chronic Issues    HPI:  He is a long term resident of this facility being seen for the management of his chronic illnesses. Overall his status is without significant change.  He is unable to fully participate in the hpi or ros; he is unable to answer questions appropriately.   There are no nursing concerns at this time.    Past Medical History  Diagnosis Date  . Alterations of sensations, late effect of cerebrovascular disease   . Acute conjunctivitis, unspecified   . Aortic aneurysm of unspecified site without mention of rupture   . Flaccid hemiplegia affecting dominant side (HCC)   . Type II or unspecified type diabetes mellitus without mention of complication, not stated as uncontrolled   . Depressive disorder, not elsewhere classified   . Unspecified essential hypertension   . Obstructive chronic bronchitis with exacerbation (HCC)   . Anxiety state, unspecified   . Esophageal reflux   . Other and unspecified hyperlipidemia   . Pressure ulcer, other site(707.09)   . Dementia   . Chronic kidney disease 08/04/2011    Acute renal failure per note 08/04/2011-Dr. Art Chilton SiGreen  . Stroke (HCC)   . Type 2 diabetes mellitus with neurological manifestations, controlled [facility](HCC)     Past Surgical History  Procedure Laterality Date  . Cystoscopy w/ ureteral stent placement  08/07/2011    Procedure: CYSTOSCOPY WITH RETROGRADE PYELOGRAM/URETERAL STENT PLACEMENT;  Surgeon: Kathi LudwigSigmund I Tannenbaum, MD;  Location: [facility];  Service: Urology;  Laterality: Bilateral;  bilateral stent placement ureterosopy  . Cystoscopy/retrograde/ureteroscopy  12/07/2011    Procedure: CYSTOSCOPY/RETROGRADE/URETEROSCOPY;  Surgeon: Kathi LudwigSigmund I Tannenbaum, MD;  Location: [facility];  Service: Urology;  Laterality: Bilateral;    . Cystoscopy w/ ureteral stent placement  12/07/2011    Procedure: CYSTOSCOPY WITH STENT REPLACEMENT;  Surgeon: Kathi LudwigSigmund I Tannenbaum, MD;  Location: [facility];  Service: Urology;  Laterality: Bilateral;  (BIL) JJ STENT EXCHANGE  . Cystoscopy with urethral dilatation  12/07/2011    Procedure: CYSTOSCOPY WITH URETHRAL DILATATION;  Surgeon: Kathi LudwigSigmund I Tannenbaum, MD;  Location: [facility];  Service: Urology;;    VITAL SIGNS BP 110/56 mmHg  Pulse 62  Ht 5\' 9"  (1.753 m)  Wt 161 lb (73.029 kg)  BMI 23.76 kg/m2  SpO2 97%  Patient's Medications  New Prescriptions   No medications on file  Previous Medications   ACETAMINOPHEN (TYLENOL) 500 MG TABLET    Take 500 mg by mouth every 8 (eight) hours as needed (for pain).   ALBUTEROL (PROVENTIL) (2.5 MG/3ML) 0.083% NEBULIZER SOLUTION    Take 2.5 mg by nebulization every 2 (two) hours as needed for wheezing.   ATORVASTATIN (LIPITOR) 20 MG TABLET    Take 20 mg by mouth at bedtime. For hyperlipidemia   BUDESONIDE-FORMOTEROL (SYMBICORT) 160-4.5 MCG/ACT INHALER    Inhale 2 puffs into the lungs 2 (two) times daily.   CITALOPRAM (CELEXA) 20 MG TABLET    Take 20 mg by mouth at bedtime. For depression   CRANBERRY 450 MG CAPS    Take 450 mg by mouth 2 (two) times daily.   GUAIFENESIN (MUCINEX) 600 MG 12 HR TABLET    Take 600 mg by mouth 2 (two) times daily. For chronic bronchitis   INSULIN ASPART (NOVOLOG) 100 UNIT/ML INJECTION  Inject 5 Units into the skin 4 (four) times daily -  before meals and at bedtime. Give for CBG > 150. Before meals and at bedtime   INSULIN GLARGINE (LANTUS) 100 UNIT/ML INJECTION    Inject 12 Units into the skin at bedtime. For DM   IPRATROPIUM-ALBUTEROL (DUONEB) 0.5-2.5 (3) MG/3ML SOLN    Take 3 mLs by nebulization every 6 (six) hours as needed. For shortness of breath   IRON POLYSACCHARIDES (NIFEREX) 150 MG CAPSULE    Take 150 mg by mouth daily.   MULTIPLE VITAMINS-MINERALS (MULTIVITAMINS THER. W/MINERALS) TABS    Take 1 tablet by mouth  every morning.    POLYETHYLENE GLYCOL (MIRALAX / GLYCOLAX) PACKET    Take 17 g by mouth every morning. For constipation   THIAMINE 100 MG TABLET    Take 100 mg by mouth every morning.   Modified Medications   No medications on file  Discontinued Medications     SIGNIFICANT DIAGNOSTIC EXAMS  07-02-14: chest x-ray: no acute disease process    LABS REVIEWED:   03-14-14: chol 120; ldl 48; trig 201; hdl 32  1-61-09: wbc 12.4; hgb 10.1; hct 35.4; mcv 88.2; plt 212; glucose 140; bun 33.4; creat 2.13; k+4.5; na++137; liver normal albumin 3.4; hgb a1c 7.6 07-02-14: wbc 21.8; hgb 10.8; hct 35.2; mcv 87.4; plt 200; glucose 190; bun 41.8; creat 3.34; k+4.5; na++145 07-07-14: wbc 12.2; hgb 10.3; hct 33.6; mcv 85.9; plt 254; glucose 136; bun 46; creat 2.45; k+4.6; na++135 11-03-14: hgb a1c 7.4; chol 144; ldl 73; trig 193; hdl 32  6-0-45: wbc 10.8; hgb 9.3; hct 32.1; mcv 87.8 ;plt 227; glucose 194; bun 29.6; creat 2.12; k+ 4.6; na++137; liver normal albumin 3.4 hgb a1c 7.9     Review of Systems Unable to perform ROS: Dementia    Physical Exam Constitutional: No distress.  Eyes: Conjunctivae are normal.  Neck: Neck supple. No JVD present. No thyromegaly present.  Cardiovascular: Normal rate, regular rhythm and intact distal pulses.   Respiratory: Effort normal and breath sounds normal. No respiratory distress. He has no wheezes.  GI: Soft. Bowel sounds are normal. He exhibits no distension. There is no tenderness.  Musculoskeletal: He exhibits no edema.  Has right hemiparesis   Lymphadenopathy:    He has no cervical adenopathy.  Neurological: He is alert.  Skin: Skin is warm and dry. He is not diaphoretic.  Psychiatric: He has a normal mood and affect.       ASSESSMENT/ PLAN:  1. Copd: he is presently stable ; he does use 02; will continue symbicort 160/4.5 mcg 2 puffs twice daily; mucinex twice daily  has  duoneb every 6 hours as needed will monitor will stop the albuterol   2.  Dyslipidemia: will continue lipitor 20 mg daily ldl is 73   3. Anemia: will continue nu-iron  daily his hgb is 9.3  4. Diabetes: is stable will continue lantus 12 units daily; and novolog 5 units prior to meals for cbg >=150 his hgb a1c is 7.9  5. Depression: is stable will continue celexa 20 mg daily   6. Constipation; will continue miralax daily   7. ckd stage III: no change in status most recent creat is 2.12  8. 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 Renal stone: no recent symptoms present; he has been seen by urology; will monitor his status.   10. 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   11. Protein calorie malnutrition: his albumin is 2.9. Will continue supplements per facility protocol. His most recent weight is 161 pounds.       Synthia Innocent NP [facility]  Contact [phone] Monday through Friday 8am- 5pm  After hours call [phone]

## 2015-03-17 ENCOUNTER — Non-Acute Institutional Stay (SKILLED_NURSING_FACILITY): Payer: Medicare Other | Admitting: Adult Health

## 2015-03-17 ENCOUNTER — Encounter: Payer: Self-pay | Admitting: Adult Health

## 2015-03-17 DIAGNOSIS — F0150 Vascular dementia without behavioral disturbance: Secondary | ICD-10-CM

## 2015-03-17 DIAGNOSIS — J449 Chronic obstructive pulmonary disease, unspecified: Secondary | ICD-10-CM | POA: Diagnosis not present

## 2015-03-17 DIAGNOSIS — E785 Hyperlipidemia, unspecified: Secondary | ICD-10-CM

## 2015-03-17 DIAGNOSIS — I69359 Hemiplegia and hemiparesis following cerebral infarction affecting unspecified side: Secondary | ICD-10-CM | POA: Diagnosis not present

## 2015-03-17 DIAGNOSIS — E1169 Type 2 diabetes mellitus with other specified complication: Secondary | ICD-10-CM

## 2015-03-17 DIAGNOSIS — N183 Chronic kidney disease, stage 3 unspecified: Secondary | ICD-10-CM

## 2015-03-17 DIAGNOSIS — I69320 Aphasia following cerebral infarction: Secondary | ICD-10-CM

## 2015-03-17 DIAGNOSIS — E1149 Type 2 diabetes mellitus with other diabetic neurological complication: Secondary | ICD-10-CM | POA: Diagnosis not present

## 2015-03-17 DIAGNOSIS — K5900 Constipation, unspecified: Secondary | ICD-10-CM | POA: Diagnosis not present

## 2015-03-17 DIAGNOSIS — N201 Calculus of ureter: Secondary | ICD-10-CM

## 2015-03-17 DIAGNOSIS — K5909 Other constipation: Secondary | ICD-10-CM

## 2015-03-30 ENCOUNTER — Emergency Department (HOSPITAL_COMMUNITY): Payer: Medicare Other

## 2015-03-30 ENCOUNTER — Inpatient Hospital Stay (HOSPITAL_COMMUNITY)
Admission: EM | Admit: 2015-03-30 | Discharge: 2015-04-05 | DRG: 871 | Disposition: A | Discharge status: Skilled Nursing Facility | Payer: Medicare Other | Attending: Internal Medicine | Admitting: Internal Medicine

## 2015-03-30 ENCOUNTER — Encounter (HOSPITAL_COMMUNITY): Payer: Self-pay | Admitting: Emergency Medicine

## 2015-03-30 DIAGNOSIS — E785 Hyperlipidemia, unspecified: Secondary | ICD-10-CM | POA: Diagnosis present

## 2015-03-30 DIAGNOSIS — E1149 Type 2 diabetes mellitus with other diabetic neurological complication: Secondary | ICD-10-CM | POA: Diagnosis present

## 2015-03-30 DIAGNOSIS — E8809 Other disorders of plasma-protein metabolism, not elsewhere classified: Secondary | ICD-10-CM | POA: Diagnosis not present

## 2015-03-30 DIAGNOSIS — R64 Cachexia: Secondary | ICD-10-CM | POA: Diagnosis present

## 2015-03-30 DIAGNOSIS — E876 Hypokalemia: Secondary | ICD-10-CM | POA: Diagnosis not present

## 2015-03-30 DIAGNOSIS — E874 Mixed disorder of acid-base balance: Secondary | ICD-10-CM | POA: Diagnosis not present

## 2015-03-30 DIAGNOSIS — Z515 Encounter for palliative care: Secondary | ICD-10-CM | POA: Diagnosis not present

## 2015-03-30 DIAGNOSIS — I69320 Aphasia following cerebral infarction: Secondary | ICD-10-CM | POA: Diagnosis not present

## 2015-03-30 DIAGNOSIS — E1122 Type 2 diabetes mellitus with diabetic chronic kidney disease: Secondary | ICD-10-CM | POA: Diagnosis present

## 2015-03-30 DIAGNOSIS — R001 Bradycardia, unspecified: Secondary | ICD-10-CM | POA: Diagnosis not present

## 2015-03-30 DIAGNOSIS — D631 Anemia in chronic kidney disease: Secondary | ICD-10-CM | POA: Diagnosis present

## 2015-03-30 DIAGNOSIS — Z794 Long term (current) use of insulin: Secondary | ICD-10-CM | POA: Diagnosis not present

## 2015-03-30 DIAGNOSIS — I69359 Hemiplegia and hemiparesis following cerebral infarction affecting unspecified side: Secondary | ICD-10-CM

## 2015-03-30 DIAGNOSIS — E860 Dehydration: Secondary | ICD-10-CM | POA: Diagnosis present

## 2015-03-30 DIAGNOSIS — N186 End stage renal disease: Secondary | ICD-10-CM | POA: Diagnosis not present

## 2015-03-30 DIAGNOSIS — E1165 Type 2 diabetes mellitus with hyperglycemia: Secondary | ICD-10-CM | POA: Diagnosis present

## 2015-03-30 DIAGNOSIS — N19 Unspecified kidney failure: Secondary | ICD-10-CM | POA: Diagnosis not present

## 2015-03-30 DIAGNOSIS — A419 Sepsis, unspecified organism: Principal | ICD-10-CM | POA: Diagnosis present

## 2015-03-30 DIAGNOSIS — L8990 Pressure ulcer of unspecified site, unspecified stage: Secondary | ICD-10-CM | POA: Diagnosis present

## 2015-03-30 DIAGNOSIS — N179 Acute kidney failure, unspecified: Secondary | ICD-10-CM | POA: Diagnosis present

## 2015-03-30 DIAGNOSIS — I447 Left bundle-branch block, unspecified: Secondary | ICD-10-CM | POA: Diagnosis present

## 2015-03-30 DIAGNOSIS — I120 Hypertensive chronic kidney disease with stage 5 chronic kidney disease or end stage renal disease: Secondary | ICD-10-CM | POA: Diagnosis present

## 2015-03-30 DIAGNOSIS — R6521 Severe sepsis with septic shock: Secondary | ICD-10-CM

## 2015-03-30 DIAGNOSIS — E871 Hypo-osmolality and hyponatremia: Secondary | ICD-10-CM | POA: Diagnosis present

## 2015-03-30 DIAGNOSIS — Z681 Body mass index (BMI) 19 or less, adult: Secondary | ICD-10-CM

## 2015-03-30 DIAGNOSIS — F0150 Vascular dementia without behavioral disturbance: Secondary | ICD-10-CM | POA: Diagnosis present

## 2015-03-30 DIAGNOSIS — N184 Chronic kidney disease, stage 4 (severe): Secondary | ICD-10-CM

## 2015-03-30 DIAGNOSIS — Z79899 Other long term (current) drug therapy: Secondary | ICD-10-CM

## 2015-03-30 DIAGNOSIS — G9340 Encephalopathy, unspecified: Secondary | ICD-10-CM | POA: Diagnosis present

## 2015-03-30 DIAGNOSIS — I719 Aortic aneurysm of unspecified site, without rupture: Secondary | ICD-10-CM | POA: Diagnosis present

## 2015-03-30 DIAGNOSIS — G92 Toxic encephalopathy: Secondary | ICD-10-CM | POA: Diagnosis not present

## 2015-03-30 DIAGNOSIS — K219 Gastro-esophageal reflux disease without esophagitis: Secondary | ICD-10-CM | POA: Diagnosis present

## 2015-03-30 DIAGNOSIS — Z7951 Long term (current) use of inhaled steroids: Secondary | ICD-10-CM | POA: Diagnosis not present

## 2015-03-30 DIAGNOSIS — N390 Urinary tract infection, site not specified: Secondary | ICD-10-CM | POA: Diagnosis present

## 2015-03-30 DIAGNOSIS — N189 Chronic kidney disease, unspecified: Secondary | ICD-10-CM

## 2015-03-30 DIAGNOSIS — F411 Generalized anxiety disorder: Secondary | ICD-10-CM | POA: Diagnosis present

## 2015-03-30 DIAGNOSIS — Z7189 Other specified counseling: Secondary | ICD-10-CM | POA: Diagnosis not present

## 2015-03-30 DIAGNOSIS — I69351 Hemiplegia and hemiparesis following cerebral infarction affecting right dominant side: Secondary | ICD-10-CM | POA: Diagnosis not present

## 2015-03-30 DIAGNOSIS — N183 Chronic kidney disease, stage 3 unspecified: Secondary | ICD-10-CM | POA: Diagnosis present

## 2015-03-30 DIAGNOSIS — F329 Major depressive disorder, single episode, unspecified: Secondary | ICD-10-CM | POA: Diagnosis present

## 2015-03-30 DIAGNOSIS — R4182 Altered mental status, unspecified: Secondary | ICD-10-CM

## 2015-03-30 DIAGNOSIS — E875 Hyperkalemia: Secondary | ICD-10-CM | POA: Diagnosis present

## 2015-03-30 DIAGNOSIS — Z66 Do not resuscitate: Secondary | ICD-10-CM | POA: Diagnosis present

## 2015-03-30 LAB — RAPID URINE DRUG SCREEN, HOSP PERFORMED
AMPHETAMINES: NOT DETECTED
Barbiturates: NOT DETECTED
Benzodiazepines: NOT DETECTED
COCAINE: NOT DETECTED
OPIATES: NOT DETECTED
TETRAHYDROCANNABINOL: NOT DETECTED

## 2015-03-30 LAB — APTT: APTT: 35 s (ref 24–37)

## 2015-03-30 LAB — CBC WITH DIFFERENTIAL/PLATELET
BASOS ABS: 0 10*3/uL (ref 0.0–0.1)
Basophils Relative: 0 %
Eosinophils Absolute: 0 10*3/uL (ref 0.0–0.7)
Eosinophils Relative: 0 %
HEMATOCRIT: 38.4 % — AB (ref 39.0–52.0)
HEMOGLOBIN: 12.1 g/dL — AB (ref 13.0–17.0)
LYMPHS PCT: 3 %
Lymphs Abs: 0.7 10*3/uL (ref 0.7–4.0)
MCH: 26.2 pg (ref 26.0–34.0)
MCHC: 31.5 g/dL (ref 30.0–36.0)
MCV: 83.3 fL (ref 78.0–100.0)
MONOS PCT: 4 %
Monocytes Absolute: 1 10*3/uL (ref 0.1–1.0)
NEUTROS ABS: 23 10*3/uL — AB (ref 1.7–7.7)
NEUTROS PCT: 93 %
Platelets: 505 10*3/uL — ABNORMAL HIGH (ref 150–400)
RBC: 4.61 MIL/uL (ref 4.22–5.81)
RDW: 14.1 % (ref 11.5–15.5)
WBC: 24.7 10*3/uL — AB (ref 4.0–10.5)

## 2015-03-30 LAB — COMPREHENSIVE METABOLIC PANEL
ALBUMIN: 2.8 g/dL — AB (ref 3.5–5.0)
ALK PHOS: 99 U/L (ref 38–126)
ALT: 16 U/L — AB (ref 17–63)
AST: 26 U/L (ref 15–41)
Anion gap: 18 — ABNORMAL HIGH (ref 5–15)
BILIRUBIN TOTAL: 0.4 mg/dL (ref 0.3–1.2)
BUN: 153 mg/dL — AB (ref 6–20)
CALCIUM: 10.6 mg/dL — AB (ref 8.9–10.3)
CO2: 13 mmol/L — AB (ref 22–32)
Chloride: 93 mmol/L — ABNORMAL LOW (ref 101–111)
Creatinine, Ser: 6 mg/dL — ABNORMAL HIGH (ref 0.61–1.24)
GFR calc Af Amer: 9 mL/min — ABNORMAL LOW (ref >60)
GFR calc non Af Amer: 8 mL/min — ABNORMAL LOW (ref >60)
GLUCOSE: 303 mg/dL — AB (ref 65–99)
Potassium: >7.5 mmol/L (ref 3.5–5.1)
Sodium: 124 mmol/L — ABNORMAL LOW (ref 135–145)
TOTAL PROTEIN: 9.2 g/dL — AB (ref 6.5–8.1)

## 2015-03-30 LAB — URINE MICROSCOPIC-ADD ON

## 2015-03-30 LAB — ETHANOL: Alcohol, Ethyl (B): <5 mg/dL (ref <5)

## 2015-03-30 LAB — BASIC METABOLIC PANEL
ANION GAP: 10 (ref 5–15)
Anion gap: 18 — ABNORMAL HIGH (ref 5–15)
BUN: 147 mg/dL — AB (ref 6–20)
BUN: 136 mg/dL — AB (ref 6–20)
CALCIUM: 11.5 mg/dL — AB (ref 8.9–10.3)
CHLORIDE: 101 mmol/L (ref 101–111)
CO2: 10 mmol/L — AB (ref 22–32)
CO2: 17 mmol/L — ABNORMAL LOW (ref 22–32)
CREATININE: 5.46 mg/dL — AB (ref 0.61–1.24)
Calcium: 10.1 mg/dL (ref 8.9–10.3)
Chloride: 100 mmol/L — ABNORMAL LOW (ref 101–111)
Creatinine, Ser: 4.94 mg/dL — ABNORMAL HIGH (ref 0.61–1.24)
GFR calc Af Amer: 11 mL/min — ABNORMAL LOW (ref >60)
GFR calc non Af Amer: 9 mL/min — ABNORMAL LOW (ref >60)
GFR, EST AFRICAN AMERICAN: 12 mL/min — AB (ref >60)
GFR, EST NON AFRICAN AMERICAN: 10 mL/min — AB (ref >60)
GLUCOSE: 207 mg/dL — AB (ref 65–99)
Glucose, Bld: 154 mg/dL — ABNORMAL HIGH (ref 65–99)
SODIUM: 128 mmol/L — AB (ref 135–145)
Sodium: 128 mmol/L — ABNORMAL LOW (ref 135–145)

## 2015-03-30 LAB — I-STAT CG4 LACTIC ACID, ED: Lactic Acid, Venous: 0.35 mmol/L — ABNORMAL LOW (ref 0.5–2.0)

## 2015-03-30 LAB — URINALYSIS, ROUTINE W REFLEX MICROSCOPIC
Glucose, UA: NEGATIVE mg/dL
KETONES UR: 15 mg/dL — AB
NITRITE: POSITIVE — AB
PH: 7 (ref 5.0–8.0)
Protein, ur: >300 mg/dL — AB
SPECIFIC GRAVITY, URINE: 1.016 (ref 1.005–1.030)

## 2015-03-30 LAB — CBG MONITORING, ED
GLUCOSE-CAPILLARY: 177 mg/dL — AB (ref 65–99)
GLUCOSE-CAPILLARY: 138 mg/dL — AB (ref 65–99)
Glucose-Capillary: 290 mg/dL — ABNORMAL HIGH (ref 65–99)

## 2015-03-30 LAB — AMMONIA: AMMONIA: 31 umol/L (ref 9–35)

## 2015-03-30 LAB — I-STAT TROPONIN, ED: Troponin i, poc: 0.02 ng/mL (ref 0.00–0.08)

## 2015-03-30 LAB — MAGNESIUM: MAGNESIUM: 3.2 mg/dL — AB (ref 1.7–2.4)

## 2015-03-30 LAB — PROTIME-INR
INR: 1.26 (ref 0.00–1.49)
Prothrombin Time: 16 seconds — ABNORMAL HIGH (ref 11.6–15.2)

## 2015-03-30 LAB — PHOSPHORUS: Phosphorus: 9.2 mg/dL — ABNORMAL HIGH (ref 2.5–4.6)

## 2015-03-30 LAB — GLUCOSE, CAPILLARY: Glucose-Capillary: 134 mg/dL — ABNORMAL HIGH (ref 65–99)

## 2015-03-30 MED ORDER — DEXTROSE 50 % IV SOLN
1.0000 | Freq: Once | INTRAVENOUS | Status: AC
  Administered 2015-03-31: 50 mL via INTRAVENOUS
  Filled 2015-03-30: qty 50

## 2015-03-30 MED ORDER — INSULIN ASPART 100 UNIT/ML IV SOLN
10.0000 [IU] | Freq: Once | INTRAVENOUS | Status: AC
  Administered 2015-03-30: 10 [IU] via INTRAVENOUS

## 2015-03-30 MED ORDER — ONDANSETRON HCL 4 MG/2ML IJ SOLN: 4.0000 mg | Freq: Four times a day (QID) | INTRAMUSCULAR | Status: DC | PRN

## 2015-03-30 MED ORDER — HEPARIN SODIUM (PORCINE) 5000 UNIT/ML IJ SOLN
5000.0000 [IU] | Freq: Three times a day (TID) | INTRAMUSCULAR | Status: DC
  Administered 2015-03-31 – 2015-04-05 (×17): 5000 [IU] via SUBCUTANEOUS
  Filled 2015-03-30 (×17): qty 1

## 2015-03-30 MED ORDER — VANCOMYCIN HCL IN DEXTROSE 1-5 GM/200ML-% IV SOLN
1000.0000 mg | Freq: Once | INTRAVENOUS | Status: AC
  Administered 2015-03-30: 1000 mg via INTRAVENOUS
  Filled 2015-03-30: qty 200

## 2015-03-30 MED ORDER — ONDANSETRON HCL 4 MG PO TABS: 4.0000 mg | ORAL_TABLET | Freq: Four times a day (QID) | ORAL | Status: DC | PRN

## 2015-03-30 MED ORDER — INSULIN ASPART 100 UNIT/ML ~~LOC~~ SOLN: 0.0000 [IU] | Freq: Three times a day (TID) | SUBCUTANEOUS | Status: DC

## 2015-03-30 MED ORDER — STERILE WATER FOR INJECTION IV SOLN
150.0000 meq | INTRAVENOUS | Status: DC
  Administered 2015-03-30 – 2015-04-01 (×5): 150 meq via INTRAVENOUS
  Filled 2015-03-30 (×11): qty 850

## 2015-03-30 MED ORDER — SODIUM CHLORIDE 0.9 % IV SOLN
1.0000 g | Freq: Once | INTRAVENOUS | Status: DC
  Filled 2015-03-30: qty 10

## 2015-03-30 MED ORDER — INSULIN ASPART 100 UNIT/ML ~~LOC~~ SOLN
0.0000 [IU] | Freq: Every day | SUBCUTANEOUS | Status: DC
  Filled 2015-03-30: qty 1

## 2015-03-30 MED ORDER — SODIUM CHLORIDE 0.9 % IJ SOLN
3.0000 mL | Freq: Two times a day (BID) | INTRAMUSCULAR | Status: DC
  Administered 2015-03-31 – 2015-04-04 (×7): 3 mL via INTRAVENOUS

## 2015-03-30 MED ORDER — SODIUM CHLORIDE 0.9 % IV BOLUS (SEPSIS)
1000.0000 mL | Freq: Once | INTRAVENOUS | Status: AC
  Administered 2015-03-30: 1000 mL via INTRAVENOUS

## 2015-03-30 MED ORDER — INSULIN ASPART 100 UNIT/ML IV SOLN
10.0000 [IU] | Freq: Once | INTRAVENOUS | Status: AC
  Administered 2015-03-31: 10 [IU] via INTRAVENOUS
  Filled 2015-03-30: qty 0.1

## 2015-03-30 MED ORDER — SODIUM POLYSTYRENE SULFONATE 15 GM/60ML PO SUSP: 30.0000 g | Freq: Once | ORAL | Status: DC

## 2015-03-30 MED ORDER — CHLORHEXIDINE GLUCONATE 0.12 % MT SOLN
15.0000 mL | Freq: Two times a day (BID) | OROMUCOSAL | Status: DC
  Administered 2015-03-31 – 2015-04-04 (×11): 15 mL via OROMUCOSAL
  Filled 2015-03-30 (×10): qty 15

## 2015-03-30 MED ORDER — SODIUM CHLORIDE 0.9 % IV SOLN
2.0000 g | Freq: Once | INTRAVENOUS | Status: AC
  Administered 2015-03-31: 2 g via INTRAVENOUS
  Filled 2015-03-30: qty 20

## 2015-03-30 MED ORDER — CETYLPYRIDINIUM CHLORIDE 0.05 % MT LIQD
7.0000 mL | Freq: Two times a day (BID) | OROMUCOSAL | Status: DC
  Administered 2015-03-31 – 2015-04-04 (×10): 7 mL via OROMUCOSAL

## 2015-03-30 MED ORDER — INSULIN ASPART 100 UNIT/ML ~~LOC~~ SOLN
SUBCUTANEOUS | Status: AC
  Filled 2015-03-30: qty 1

## 2015-03-30 MED ORDER — PIPERACILLIN-TAZOBACTAM IN DEX 2-0.25 GM/50ML IV SOLN
2.2500 g | Freq: Three times a day (TID) | INTRAVENOUS | Status: DC
  Administered 2015-03-31 – 2015-04-02 (×8): 2.25 g via INTRAVENOUS
  Filled 2015-03-30 (×10): qty 50

## 2015-03-30 MED ORDER — SODIUM CHLORIDE 0.9 % IV SOLN
INTRAVENOUS | Status: DC
  Administered 2015-03-30: 17:00:00 via INTRAVENOUS

## 2015-03-30 MED ORDER — CALCIUM CHLORIDE 10 % IV SOLN
1.0000 g | Freq: Once | INTRAVENOUS | Status: AC
  Administered 2015-03-30: 1 g via INTRAVENOUS

## 2015-03-30 MED ORDER — SODIUM POLYSTYRENE SULFONATE 15 GM/60ML PO SUSP
45.0000 g | Freq: Once | ORAL | Status: AC
Start: 2015-03-30 — End: 2015-03-31
  Administered 2015-03-31: 45 g via RECTAL
  Filled 2015-03-30: qty 180

## 2015-03-30 MED ORDER — SODIUM CHLORIDE 0.9 % IV BOLUS (SEPSIS)
1000.0000 mL | Freq: Once | INTRAVENOUS | Status: AC
Start: 2015-03-30 — End: 2015-03-30
  Administered 2015-03-30: 1000 mL via INTRAVENOUS

## 2015-03-30 MED ORDER — ONDANSETRON HCL 4 MG/2ML IJ SOLN
4.0000 mg | Freq: Once | INTRAMUSCULAR | Status: AC
  Administered 2015-03-30: 4 mg via INTRAVENOUS
  Filled 2015-03-30: qty 2

## 2015-03-30 MED ORDER — PIPERACILLIN-TAZOBACTAM 3.375 G IVPB 30 MIN
3.3750 g | Freq: Once | INTRAVENOUS | Status: AC
  Administered 2015-03-30: 3.375 g via INTRAVENOUS
  Filled 2015-03-30: qty 50

## 2015-03-30 MED ORDER — SODIUM CHLORIDE 0.9 % IV BOLUS (SEPSIS)
500.0000 mL | Freq: Once | INTRAVENOUS | Status: AC
  Administered 2015-03-30: 500 mL via INTRAVENOUS

## 2015-03-30 NOTE — ED Notes (Signed)
DNR band placed on pts right arm.

## 2015-03-30 NOTE — Consult Note (Signed)
PULMONARY / CRITICAL CARE MEDICINE   Name: Justin Sherman MRN: [id number] DOB: Aug 17, 1940    ADMISSION DATE:  03/30/2015 CONSULTATION DATE: 12/21  REFERRING MD:  Dr. Ranae Palms  CHIEF COMPLAINT:  Acute change in mental status  HISTORY OF PRESENT ILLNESS:  This is a 74 yo WM, SNF resident with a h/o CVA with residual right hemiplegia, type 2 diabetes mellitus, COPD on supplemental O2 [place], and CKD stage 3 who presents with altered mental status. History is obtained from EMS/ED records as patient is confused. EMS was called be cause patient had an acute change in mental status. He has mild expressive aphasia at baseline but will usually respond to verbal commands. Today he was completely non-verbal and was tachypneic. At the ED, his creatinine was 6, potassium >7.5 and WBC 24.7. His UA is positive for nitrite, severe proteinuria, WBC and large leukocyte esterase. He is awake. Denies pain and difficulty breathing.   PAST MEDICAL HISTORY :  He  has a past medical history of Alterations of sensations, late effect of cerebrovascular disease; Acute conjunctivitis, unspecified; Aortic aneurysm of unspecified site without mention of rupture; Flaccid hemiplegia affecting dominant side (HCC); Type II or unspecified type diabetes mellitus without mention of complication, not stated as uncontrolled; Depressive disorder, not elsewhere classified; Unspecified essential hypertension; Obstructive chronic bronchitis with exacerbation (HCC); Anxiety state, unspecified; Esophageal reflux; Other and unspecified hyperlipidemia; Pressure ulcer, other site(707.09); Dementia; Chronic kidney disease (08/04/2011); Stroke [facility]); and Type 2 diabetes mellitus with neurological manifestations, controlled (HCC).  PAST SURGICAL HISTORY: He  has past surgical history that includes Cystoscopy w/ ureteral stent placement (08/07/2011); Cystoscopy/retrograde/ureteroscopy (12/07/2011); Cystoscopy w/ ureteral stent placement (12/07/2011); and  Cystoscopy with urethral dilatation (12/07/2011).  No Known Allergies  No current facility-administered medications on file prior to encounter.   Current Outpatient Prescriptions on File Prior to Encounter  Medication Sig  . atorvastatin (LIPITOR) 20 MG tablet Take 20 mg by mouth at bedtime. For hyperlipidemia  . budesonide-formoterol (SYMBICORT) 160-4.5 MCG/ACT inhaler Inhale 2 puffs into the lungs 2 (two) times daily.  . citalopram (CELEXA) 20 MG tablet Take 20 mg by mouth at bedtime. For depression  . Cranberry 450 MG CAPS Take 450 mg by mouth 2 (two) times daily.  Marland Kitchen guaiFENesin (MUCINEX) 600 MG 12 hr tablet Take 600 mg by mouth 2 (two) times daily. For chronic bronchitis  . insulin aspart (NOVOLOG) 100 UNIT/ML injection Inject 5 Units into the skin 4 (four) times daily -  before meals and at bedtime. Give for CBG > 150. Before meals and at bedtime  . insulin glargine (LANTUS) 100 UNIT/ML injection Inject 12 Units into the skin at bedtime. For DM  . iron polysaccharides (NIFEREX) 150 MG capsule Take 150 mg by mouth daily.  . Multiple Vitamins-Minerals (MULTIVITAMINS THER. W/MINERALS) TABS Take 1 tablet by mouth every morning.   . polyethylene glycol (MIRALAX / GLYCOLAX) packet Take 17 g by mouth every morning. For constipation  . thiamine 100 MG tablet Take 100 mg by mouth every morning.   Marland Kitchen acetaminophen (TYLENOL) 500 MG tablet Take 500 mg by mouth every 8 (eight) hours as needed (for pain).  Marland Kitchen albuterol (PROVENTIL) (2.5 MG/3ML) 0.083% nebulizer solution Take 2.5 mg by nebulization every 2 (two) hours as needed for wheezing.  Marland Kitchen ipratropium-albuterol (DUONEB) 0.5-2.5 (3) MG/3ML SOLN Take 3 mLs by nebulization every 6 (six) hours as needed. For shortness of breath    FAMILY HISTORY:  His has no family status information on file.   SOCIAL HISTORY:  He  reports that he has never smoked. He does not have any smokeless tobacco history on file. He reports that he does not drink alcohol or use  illicit drugs.  REVIEW OF SYSTEMS:   Limited due to confusion and aphasia. His responses are inconsistent  SUBJECTIVE:  Chronically ill, confused and aphasic  VITAL SIGNS: BP 130/108 mmHg  Pulse 115  Resp 20  SpO2 99%  HEMODYNAMICS: n/a    VENTILATOR SETTINGS: n/a    INTAKE / OUTPUT:    PHYSICAL EXAMINATION: General: pale and ill looking male, awake, no distress Neuro: Confused, able to follow some basic commands, speech is garbled, right hemiplegia. HEENT: PERRLA, oral mucosa dry with copious secretions, trachea midline Cardiovascular: Tachycardia, S1/S2, no MRG Lungs: Unlabored, CTAB Abdomen: Non-distended, soft, normal bowel sounds Musculoskeletal: right-se flaccid, no joint deformities Skin: Pale, afebrile  LABS:  BMET  Recent Labs Lab 03/30/15 1345  NA 124*  K >7.5*  CL 93*  CO2 13*  BUN 153*  CREATININE 6.00*  GLUCOSE 303*    Electrolytes  Recent Labs Lab 03/30/15 1345  CALCIUM 10.6*  MG 3.2*    CBC  Recent Labs Lab 03/30/15 1345  WBC 24.7*  HGB 12.1*  HCT 38.4*  PLT 505*    Coag's  Recent Labs Lab 03/30/15 1345  APTT 35  INR 1.26    Sepsis Markers  Recent Labs Lab 03/30/15 1359  LATICACIDVEN 0.35*    ABG No results for input(s): PHART, PCO2ART, PO2ART in the last 168 hours.  Liver Enzymes  Recent Labs Lab 03/30/15 1345  AST 26  ALT 16*  ALKPHOS 99  BILITOT 0.4  ALBUMIN 2.8*    Cardiac Enzymes No results for input(s): TROPONINI, PROBNP in the last 168 hours.  Glucose  Recent Labs Lab 03/30/15 1415  GLUCAP 290*    Imaging Dg Chest Port 1 View  03/30/2015  CLINICAL DATA:  Nausea, vomiting.  Altered mental status. EXAM: PORTABLE CHEST 1 VIEW COMPARISON:  08/17/2012 FINDINGS: The heart size and mediastinal contours are within normal limits. Both lungs are clear. The visualized skeletal structures are unremarkable. IMPRESSION: No active disease. Electronically Signed   By: Charlett NoseKevin  Dover M.D.   On:  03/30/2015 14:27     STUDIES:  CT head 12/21>> EKG 12/21: ST, no acute ST changes  CULTURES: Blood, urine, sputum>>12/21  ANTIBIOTICS: Zosyn and vanco 12/21>>  SIGNIFICANT EVENTS: 12/21: Decreased responsiveness; ED from SNF, at ED found to be in complete renal failure with severe hyperkalemia and sepsis.    LINES/TUBES: PIVs  ASSESSMENT / PLAN 74 YO WM SNF resident with multiple comorbidities presenting with CKD stage 4 secondary to volume depletion,  possible ATN , severe hyperkalemia, hyperglycemia and sepsis of urinary source. Patient needs immediate dialysis but the HCP has decided not proceed with dialysis and to continue with medical management. HCP does not want patient intubated or resuscitated. Patient has been made DNR with limitation of treatments. Otherwise patient is hemodynamically stable. There's no need for ICU level care   Plan -Conservative medical management of ESRD and hyperkalemia per nephrology   -Empric IV antibiotics for sepsis -f/u cultures -Rest of treatment plan per primary team -PCCM will sign-off    Magdalene S. Luci Bankukov, NP-C Pulmonary and Critical Care Medicine [facility] Pager: [phone]  03/30/2015, 3:41 PM  Attending Note:  74 year old male [facility], [facility] with AMS. He was found to have urosepsis and acute renal failure with  life threatening hyperkalemia for which he was treated medically. Nephrology was called in consultation and after their discussion with pt's brother Chanetta Marshall, decision was made not to initiate any dialysis as this was against pt's wishes. I then called Chanetta Marshall personally to discuss his current situation and organ failures. Jimmy informed me that his brother would never want to be placed on life support machines, dialysis, or go through CPR. I explained to him that if we are not able to medically treat his hyperkalemia, then his brother would certainly develop a life  threatening and most likely fatal arrhythmia. Chanetta Marshall understood this and all questions were answered. DNR orders were placed and pt was admitted by [facility].  The patient is critically ill with multiple organ systems failure and requires high complexity decision making for assessment and support, frequent evaluation and titration of therapies, application of advanced monitoring technologies and extensive interpretation of multiple databases.   Critical Care Time devoted to patient care services described in this note is 35 Minutes. This time reflects time of care of this signee Dr Koren Bound. This critical care time does not reflect procedure time, or teaching time or supervisory time of PA/NP/Med student/Med Resident etc but could involve care discussion time.  Alyson Reedy, M.D. [facility] Pulmonary/Critical Care Medicine. Pager: [phone]. After hours pager: [phone].

## 2015-03-30 NOTE — H&P (Signed)
[facility] History and Physical  Midge MiniumCharles Lizotte HYQ:[id number] DOB: 10/24/1940 DOA: 03/30/2015  Referring physician: Dr. Ranae PalmsYelverton - [facility] PCP: [name], ANNE D, MD   Chief Complaint:  AMS, Hyperglycemia  HPI: Midge MiniumCharles Brunkow is a 74 y.o. male  Level V caveat: Patient presenting in altered mental state and presenting from nursing home. History provided by patient's brother, EMS, nursing home report, and EDP. Per report patient had acute change in mental status today. Patient not responding to verbal commands which he usually does. Patient became completely nonverbal and tachypnea At the nursing home. No further history available.  Review of Systems:  Per HPI. Unable to obtain further ROS due to patient's mental status and acuity of illness.  Past Medical History  Diagnosis Date  . Alterations of sensations, late effect of cerebrovascular disease   . Acute conjunctivitis, unspecified   . Aortic aneurysm of unspecified site without mention of rupture   . Flaccid hemiplegia affecting dominant side (HCC)   . Type II or unspecified type diabetes mellitus without mention of complication, not stated as uncontrolled   . Depressive disorder, not elsewhere classified   . Unspecified essential hypertension   . Obstructive chronic bronchitis with exacerbation (HCC)   . Anxiety state, unspecified   . Esophageal reflux   . Other and unspecified hyperlipidemia   . Pressure ulcer, other site(707.09)   . Dementia   . Chronic kidney disease 08/04/2011    Acute renal failure per note 08/04/2011-Dr. Art Chilton [facility]  . Stroke (HCC)   . Type 2 diabetes mellitus with neurological manifestations, controlled [facility](HCC)    Past Surgical History  Procedure Laterality Date  . Cystoscopy w/ ureteral stent placement  08/07/2011    Procedure: CYSTOSCOPY WITH RETROGRADE PYELOGRAM/URETERAL STENT PLACEMENT;  Surgeon: Kathi LudwigSigmund I Tannenbaum, MD;  Location: [facility];  Service: Urology;  Laterality: Bilateral;  bilateral  stent placement ureterosopy  . Cystoscopy/retrograde/ureteroscopy  12/07/2011    Procedure: CYSTOSCOPY/RETROGRADE/URETEROSCOPY;  Surgeon: Kathi LudwigSigmund I Tannenbaum, MD;  Location: [facility];  Service: Urology;  Laterality: Bilateral;  . Cystoscopy w/ ureteral stent placement  12/07/2011    Procedure: CYSTOSCOPY WITH STENT REPLACEMENT;  Surgeon: Kathi LudwigSigmund I Tannenbaum, MD;  Location: [facility];  Service: Urology;  Laterality: Bilateral;  (BIL) JJ STENT EXCHANGE  . Cystoscopy with urethral dilatation  12/07/2011    Procedure: CYSTOSCOPY WITH URETHRAL DILATATION;  Surgeon: Kathi LudwigSigmund I Tannenbaum, MD;  Location: [facility];  Service: Urology;;   Social History:  reports that he has never smoked. He does not have any smokeless tobacco history on file. He reports that he does not drink alcohol or use illicit drugs.  No Known Allergies  Family History  Problem Relation Age of Onset  . Family history unknown: Yes     Prior to Admission medications   Medication Sig Start Date End Date Taking? Authorizing Provider  atorvastatin (LIPITOR) 20 MG tablet Take 20 mg by mouth at bedtime. For hyperlipidemia   Yes Historical Provider, MD  budesonide-formoterol (SYMBICORT) 160-4.5 MCG/ACT inhaler Inhale 2 puffs into the lungs 2 (two) times daily.   Yes Historical Provider, MD  citalopram (CELEXA) 20 MG tablet Take 20 mg by mouth at bedtime. For depression   Yes Historical Provider, MD  Cranberry 450 MG CAPS Take 450 mg by mouth 2 (two) times daily.   Yes Historical Provider, MD  guaiFENesin (MUCINEX) 600 MG 12 hr tablet Take 600 mg by mouth 2 (two) times daily. For chronic bronchitis   Yes Historical Provider, MD  insulin aspart (NOVOLOG) 100  UNIT/ML injection Inject 5 Units into the skin 4 (four) times daily -  before meals and at bedtime. Give for CBG > 150. Before meals and at bedtime   Yes Historical Provider, MD  insulin glargine (LANTUS) 100 UNIT/ML injection Inject 12 Units into the skin at bedtime. For DM   Yes Historical  Provider, MD  iron polysaccharides (NIFEREX) 150 MG capsule Take 150 mg by mouth daily.   Yes Historical Provider, MD  Multiple Vitamins-Minerals (MULTIVITAMINS THER. W/MINERALS) TABS Take 1 tablet by mouth every morning.    Yes Historical Provider, MD  polyethylene glycol (MIRALAX / GLYCOLAX) packet Take 17 g by mouth every morning. For constipation   Yes Historical Provider, MD  thiamine 100 MG tablet Take 100 mg by mouth every morning.    Yes Historical Provider, MD  acetaminophen (TYLENOL) 500 MG tablet Take 500 mg by mouth every 8 (eight) hours as needed (for pain).    Historical Provider, MD  albuterol (PROVENTIL) (2.5 MG/3ML) 0.083% nebulizer solution Take 2.5 mg by nebulization every 2 (two) hours as needed for wheezing.    Historical Provider, MD  ipratropium-albuterol (DUONEB) 0.5-2.5 (3) MG/3ML SOLN Take 3 mLs by nebulization every 6 (six) hours as needed. For shortness of breath    Historical Provider, MD   Physical Exam: Filed Vitals:   03/30/15 1445 03/30/15 1546 03/30/15 1600 03/30/15 1615  BP: 130/108 152/89 156/99 154/86  Pulse: 115 93 96 91  Resp: SpO2: 99% 100% 100% 99%    Wt Readings from Last 3 Encounters:  03/17/15 72.122 kg (159 lb)  02/15/15 73.029 kg (161 lb)  12/09/14 77.565 kg (171 lb)    General: Cachectic, elderly, frail Eyes:  PERRL, EOMI,  ENT: Dry mucous membranes Neck:  no LAD, masses or thyromegaly Cardiovascular:  Faint heart sounds , RRR, no appreciable murmur, No LE edema.  Respiratory:  CTA bilaterally, no w/r/r. Normal respiratory effort. Abdomen:  soft, ntnd Skin: Poor skin turgor, no rash or induration seen on limited exam Musculoskeletal: Generalized weakness, no effusions Psychiatric: Answers yes or no to all questions but no additional responses given. Intermittently follows basic commands. Neurologic: Able to fully ascertain due to patient's mental status. Moves all extremity is in coordinated fashion. No appreciable focal  abnormality. .          Labs on Admission:  Basic Metabolic Panel:  Recent Labs Lab 03/30/15 1345  NA 124*  K >7.5*  CL 93*  CO2 13*  GLUCOSE 303*  BUN 153*  CREATININE 6.00*  CALCIUM 10.6*  MG 3.2*   Liver Function Tests:  Recent Labs Lab 03/30/15 1345  AST 26  ALT 16*  ALKPHOS 99  BILITOT 0.4  PROT 9.2*  ALBUMIN 2.8*   No results for input(s): LIPASE, AMYLASE in the last 168 hours.  Recent Labs Lab 03/30/15 1525  AMMONIA 31   CBC:  Recent Labs Lab 03/30/15 1345  WBC 24.7*  NEUTROABS 23.0*  HGB 12.1*  HCT 38.4*  MCV 83.3  PLT 505*   Cardiac Enzymes: No results for input(s): CKTOTAL, CKMB, CKMBINDEX, TROPONINI in the last 168 hours.  BNP (last 3 results) No results for input(s): BNP in the last 8760 hours.  ProBNP (last 3 results) No results for input(s): PROBNP in the last 8760 hours.   CREATININE: 6 mg/dL ABNORMAL (78/29/56 2130) Estimated creatinine clearance - 10.5 mL/min  CBG:  Recent Labs Lab 03/30/15 1415  GLUCAP 290*    Radiological Exams on Admission:  Dg Chest Port 1 View  03/30/2015  CLINICAL DATA:  Nausea, vomiting.  Altered mental status. EXAM: PORTABLE CHEST 1 VIEW COMPARISON:  08/17/2012 FINDINGS: The heart size and mediastinal contours are within normal limits. Both lungs are clear. The visualized skeletal structures are unremarkable. IMPRESSION: No active disease. Electronically Signed   By: Charlett Nose M.D.   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 Pt not a  dialysis candidate. SIRS. Urine grossly abnormal, lactic acid 0.35, creatinine 6, BUN 153, sodium 124, potassium greater than 7.5, WBC 24.7, afebrile vital signs stable. Patient is DO NOT RESUSCITATE. Critical care consult by EDP who had lengthy discussion with patient's brother is very adamant about ensuring patient does not have CPR or intubation. - Stepdown - BCX, UCX, - zosyn (Health care acquired UTI). Consider narrowing in am - IVF  Hyperkalemia: >7.5 on admission. CaGlu, CaCl, Insulin/Glu, Kayexelate, IVF all given in ED. EKG showing new LBBB, and sinus tach. High likelihood that this could cause fatal arrhythmia.  - BMET stat then in am - IVF - further therapies as indicated  Acute renal failure: 6 on admission. baseline 3.3. BUN 153. Per pts wishes, he does not want dialysis. - IVF - BMET in am  Hemparesis and aphasia from CVA: at baseline. Typically will follow basic commands.  - PT  Hyponatremia: 124 (corrected to 127 for hypoglycemia). Secondary to acute renal failure and poor nutrition.  - IVF - BMET in am  Social: pt approaching end of life.  - Palliative care consult for GOC and possible comfort measures vs hospice   Code Status: DNR  DVT Prophylaxis: Hep Family Communication: None - brother contacted adn discussion had regarding care w/ CCM.  Disposition Plan: Pending Improvement    MERRELL, DAVID Shela Commons, MD Family Medicine [facility] [email or web address] Password [email or web address]

## 2015-03-30 NOTE — ED Notes (Signed)
MD at bedside. 

## 2015-03-30 NOTE — Consult Note (Signed)
Justin MiniumCharles Sherman is an 74 y.o. male referred by Dr Ranae PalmsYelverton   Chief Complaint: Acute on CKD 4, hyperkalemia HPI: 74yo WM who was sent from nursing home because of decrease LOC.  Has CKD 4 with baseline Scr 2's (last one Sept 2 '16 of 2.12 mentioned in note from NP at his NH).  In ER labs showed Scr 6 and K >7.5.  Pt aphasic and detailed hx difficult to obtain.  Has underlying DM of ? Duration and also hx of obstruction renal stones.  Has had ureteral stents placed and unclear if they are still in.  BP lowish on arrival to ER with SBP recorded in the 90's.  I/O cath in ER showed pus but no urine and bladder scan did not show distended bladder.  Daughter in law said he was taken to urologist who recommended a procedure but NH said No as "heart is too weak".  Past Medical History  Diagnosis Date  . Alterations of sensations, late effect of cerebrovascular disease   . Acute conjunctivitis, unspecified   . Aortic aneurysm of unspecified site without mention of rupture   . Flaccid hemiplegia affecting dominant side (HCC)   . Type II or unspecified type diabetes mellitus without mention of complication, not stated as uncontrolled   . Depressive disorder, not elsewhere classified   . Unspecified essential hypertension   . Obstructive chronic bronchitis with exacerbation (HCC)   . Anxiety state, unspecified   . Esophageal reflux   . Other and unspecified hyperlipidemia   . Pressure ulcer, other site(707.09)   . Dementia   . Chronic kidney disease 08/04/2011    Acute renal failure per note 08/04/2011-Dr. Art Chilton [facility]  . Stroke (HCC)   . Type 2 diabetes mellitus with neurological manifestations, controlled [facility](HCC)     Past Surgical History  Procedure Laterality Date  . Cystoscopy w/ ureteral stent placement  08/07/2011    Procedure: CYSTOSCOPY WITH RETROGRADE PYELOGRAM/URETERAL STENT PLACEMENT;  Surgeon: Kathi LudwigSigmund I Tannenbaum, MD;  Location: [facility];  Service: Urology;  Laterality: Bilateral;  bilateral  stent placement ureterosopy  . Cystoscopy/retrograde/ureteroscopy  12/07/2011    Procedure: CYSTOSCOPY/RETROGRADE/URETEROSCOPY;  Surgeon: Kathi LudwigSigmund I Tannenbaum, MD;  Location: [facility];  Service: Urology;  Laterality: Bilateral;  . Cystoscopy w/ ureteral stent placement  12/07/2011    Procedure: CYSTOSCOPY WITH STENT REPLACEMENT;  Surgeon: Kathi LudwigSigmund I Tannenbaum, MD;  Location: [facility];  Service: Urology;  Laterality: Bilateral;  (BIL) JJ STENT EXCHANGE  . Cystoscopy with urethral dilatation  12/07/2011    Procedure: CYSTOSCOPY WITH URETHRAL DILATATION;  Surgeon: Kathi LudwigSigmund I Tannenbaum, MD;  Location: [facility];  Service: Urology;;    No family history on file. Social History:  reports that he has never smoked. He does not have any smokeless tobacco history on file. He reports that he does not drink alcohol or use illicit drugs. Resides in NH  Allergies: No Known Allergies   (Not in a hospital admission)   Lab Results: UA: >300 protein, TNTC wbc, TNC rbc's   Recent Labs  03/30/15 1345  WBC 24.7*  HGB 12.1*  HCT 38.4*  PLT 505*   BMET  Recent Labs  03/30/15 1345  NA 124*  K >7.5*  CL 93*  CO2 13*  GLUCOSE 303*  BUN 153*  CREATININE 6.00*  CALCIUM 10.6*   LFT  Recent Labs  03/30/15 1345  PROT 9.2*  ALBUMIN 2.8*  AST 26  ALT 16*  ALKPHOS 99  BILITOT 0.4   [facility] [address]  03/30/2015  CLINICAL DATA:  Nausea, vomiting.  Altered mental status. EXAM: PORTABLE CHEST 1 VIEW COMPARISON:  08/17/2012 FINDINGS: The heart size and mediastinal contours are within normal limits. Both lungs are clear. The visualized skeletal structures are unremarkable. IMPRESSION: No active disease. Electronically Signed   By: Charlett Nose M.D.   On: 03/30/2015 14:27    ROS: Pt can nod yes and no No change in vision No SOB No CP No abd pain No flank pain Appetite poor Unable to move Rt side   PHYSICAL EXAM: Blood pressure 130/108, pulse 115, resp. rate 20, SpO2 99 %. HEENT: PERRLA  EOMI NECK:No JVD LUNGS:clear CARDIAC:RRR wo MRG ABD:+ BS NTND No HSM Bladder not palpable EXT:NO CCE Skin: decreased turgor NEURO:Unable to move Rt upper or lower ext., awake and alert but aphasic  Assessment: 1. Acute on CKD 4  DDx: Volume depletion +/- ATN, Ureteral obstruction due to stone of occluded stents (if still in) 2. Hyperkalemia 3. DM 4. Hx stroke with Rt sided paralysis and aphasia 5. Hx renal stones PLAN: 1. I spoke with his brother and daughter in law and discussed the situation with his kidneys and the K level.  Discussed medical therapy vs dialysis and they feel medical therapy without dialysis would be most appropriate course and I agree.  Spoke to Dr Molli Knock who has also talked with family and has made pt a NO CODE 2. Cont to treat hyperkalemia medically with fluids, bicarb and kayexalate 3. Renal US 4. Daily labs 5. Overall prognosis is poor   Trinitey Roache T 03/30/2015, 3:42 PM

## 2015-03-30 NOTE — ED Notes (Signed)
[facility] [address]. EMS reports they were called out for a decrease in LOC of patient since 0800, the facility reported that patient normally responds verbally but has not been doing so today. Pt is tachypneic, wears 2L O2 regularly. Pt alert, but disoriented at this time.

## 2015-03-30 NOTE — ED Provider Notes (Signed)
CSN: [id number]     Arrival date & time 03/30/15  1329 History   First MD Initiated Contact with Patient 03/30/15 1333     Chief Complaint  Patient presents with  . Altered Mental Status  . Hyperglycemia     (Consider location/radiation/quality/duration/timing/severity/associated sxs/prior Treatment) HPI Level V caveat due to altered mental status. Patient is coming from nursing home by EMS for decreased responsiveness. Patient has history of COPD, diabetes and dementia. Past Medical History  Diagnosis Date  . Alterations of sensations, late effect of cerebrovascular disease   . Acute conjunctivitis, unspecified   . Aortic aneurysm of unspecified site without mention of rupture   . 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 Type II or unspecified type diabetes mellitus without mention of complication, not stated as uncontrolled   . Depressive disorder, not elsewhere classified   . Unspecified essential hypertension   . Obstructive chronic bronchitis with exacerbation ([place])   . Anxiety state, unspecified   . Esophageal reflux   . Other and unspecified hyperlipidemia   . Pressure ulcer, other site(707.09)   . Dementia   . Chronic kidney disease 08/04/2011    Acute renal failure per note 08/04/2011-Dr. Art Nyoka Cowden  . Stroke ([place])   . Type 2 diabetes mellitus with neurological manifestations, controlled [facility])    Past Surgical History  Procedure Laterality Date  . Cystoscopy w/ ureteral stent placement  08/07/2011    Procedure: CYSTOSCOPY WITH RETROGRADE PYELOGRAM/URETERAL STENT PLACEMENT;  Surgeon: Ailene Rud, MD;  Location: [facility];  Service: Urology;  Laterality: Bilateral;  bilateral stent placement ureterosopy  . Cystoscopy/retrograde/ureteroscopy  12/07/2011    Procedure: CYSTOSCOPY/RETROGRADE/URETEROSCOPY;  Surgeon: Ailene Rud, MD;  [facility]: [facility];  Service: Urology;  Laterality: Bilateral;  . Cystoscopy w/ ureteral stent placement  12/07/2011     Procedure: CYSTOSCOPY WITH STENT REPLACEMENT;  Surgeon: Ailene Rud, MD;  Location: [facility];  Service: Urology;  Laterality: Bilateral;  (BIL) JJ STENT EXCHANGE  . Cystoscopy with urethral dilatation  12/07/2011    Procedure: CYSTOSCOPY WITH URETHRAL DILATATION;  Surgeon: Ailene Rud, MD;  [facility];  Service: Urology;;   Family History  Problem Relation Age of Onset  . Family history unknown: Yes   Social History  Substance Use Topics  . Smoking status: Never Smoker   . Smokeless tobacco: None  . Alcohol Use: No    Review of Systems  Unable to perform ROS: Mental status change      Allergies  Review of patient's allergies indicates no known allergies.  Home Medications   Prior to Admission medications   Medication Sig Start Date End Date Taking? Authorizing Provider  atorvastatin (LIPITOR) 20 MG tablet Take 20 mg by mouth at bedtime. For hyperlipidemia   Yes Historical Provider, MD  budesonide-formoterol (SYMBICORT) 160-4.5 MCG/ACT inhaler Inhale 2 puffs into the lungs 2 (two) times daily.   Yes Historical Provider, MD  citalopram (CELEXA) 20 MG tablet Take 20 mg by mouth at bedtime. For depression   Yes Historical Provider, MD  Cranberry 450 MG CAPS Take 450 mg by mouth 2 (two) times daily.   Yes Historical Provider, MD  guaiFENesin (MUCINEX) 600 MG 12 hr tablet Take 600 mg by mouth 2 (two) times daily. For chronic bronchitis   Yes Historical Provider, MD  insulin aspart (NOVOLOG) 100 UNIT/ML injection Inject 5 Units into the skin 4 (four) times daily -  before meals and at bedtime. Give for CBG > 150. Before meals  and at bedtime   Yes Historical Provider, MD  insulin glargine (LANTUS) 100 UNIT/ML injection Inject 12 Units into the skin at bedtime. For DM   Yes Historical Provider, MD  iron polysaccharides (NIFEREX) 150 MG capsule Take 150 mg by mouth daily.   Yes Historical Provider, MD  Multiple Vitamins-Minerals (MULTIVITAMINS THER. W/MINERALS) TABS  Take 1 tablet by mouth every morning.    Yes Historical Provider, MD  polyethylene glycol (MIRALAX / GLYCOLAX) packet Take 17 g by mouth every morning. For constipation   Yes Historical Provider, MD  thiamine 100 MG tablet Take 100 mg by mouth every morning.    Yes Historical Provider, MD  acetaminophen (TYLENOL) 500 MG tablet Take 500 mg by mouth every 8 (eight) hours as needed (for pain).    Historical Provider, MD  albuterol (PROVENTIL) (2.5 MG/3ML) 0.083% nebulizer solution Take 2.5 mg by nebulization every 2 (two) hours as needed for wheezing.    Historical Provider, MD  ipratropium-albuterol (DUONEB) 0.5-2.5 (3) MG/3ML SOLN Take 3 mLs by nebulization every 6 (six) hours as needed. For shortness of breath    Historical Provider, MD   BP 152/89 mmHg  Pulse 93  Resp 22  SpO2 100% Physical Exam  Constitutional: He appears well-developed. No distress.  Patient is very dry and emaciated. Shallow, rapid respirations.  HENT:  Head: Normocephalic and atraumatic.  Mouth/Throat: Oropharynx is clear and moist.  Dry mucous membranes  Eyes: EOM are normal. Pupils are equal, round, and reactive to light.  Neck: Normal range of motion. Neck supple. No JVD present.  No meningismus.  Cardiovascular: Exam reveals no gallop and no friction rub.   No murmur heard. Tachycardia. Irregularly, irregular  Pulmonary/Chest: Breath sounds normal. No respiratory distress. He has no wheezes. He has no rales.  Increased respiratory effort  Abdominal: Soft. Bowel sounds are normal. He exhibits no distension and no mass. There is tenderness (diffuse abdominal tenderness to palpation). There is no rebound and no guarding.  Musculoskeletal: Normal range of motion. He exhibits no edema or tenderness.  Distal pulses intact. No lower extremity swelling or pain.  Neurological:  Patient following simple commands. Paralysis of the right lower extremity. Limited motion of the right upper extremity. Sensation to appears to  be intact. Patient is nonverbal.  Skin: Skin is warm and dry. No rash noted. No erythema.  Nursing note and vitals reviewed.   ED Course  Procedures (including critical care time) Labs Review Labs Reviewed  CBC WITH DIFFERENTIAL/PLATELET - Abnormal; Notable for the following:    WBC 24.7 (*)    Hemoglobin 12.1 (*)    HCT 38.4 (*)    Platelets 505 (*)    Neutro Abs 23.0 (*)    All other components within normal limits  COMPREHENSIVE METABOLIC PANEL - Abnormal; Notable for the following:    Sodium 124 (*)    Potassium >7.5 (*)    Chloride 93 (*)    CO2 13 (*)    Glucose, Bld 303 (*)    BUN 153 (*)    Creatinine, Ser 6.00 (*)    Calcium 10.6 (*)    Total Protein 9.2 (*)    Albumin 2.8 (*)    ALT 16 (*)    GFR calc non Af Amer 8 (*)    GFR calc Af Amer 9 (*)    Anion gap 18 (*)    All other components within normal limits  PROTIME-INR - Abnormal; Notable for the following:    Prothrombin Time  16.0 (*)    All other components within normal limits  URINALYSIS, ROUTINE W REFLEX MICROSCOPIC (NOT AT [facility]) - Abnormal; Notable for the following:    Color, Urine BROWN (*)    APPearance TURBID (*)    Hgb urine dipstick LARGE (*)    Bilirubin Urine MODERATE (*)    Ketones, ur 15 (*)    Protein, ur >300 (*)    Nitrite POSITIVE (*)    Leukocytes, UA LARGE (*)    All other components within normal limits  MAGNESIUM - Abnormal; Notable for the following:    Magnesium 3.2 (*)    All other components within normal limits  URINE MICROSCOPIC-ADD ON - Abnormal; Notable for the following:    Squamous Epithelial / LPF 6-30 (*)    Bacteria, UA MANY (*)    All other components within normal limits  I-STAT CG4 LACTIC ACID, ED - Abnormal; Notable for the following:    Lactic Acid, Venous 0.35 (*)    All other components within normal limits  CBG MONITORING, ED - Abnormal; Notable for the following:    Glucose-Capillary 290 (*)    All other components within normal limits  CULTURE, BLOOD  (ROUTINE X 2)  CULTURE, BLOOD (ROUTINE X 2)  AMMONIA  URINE RAPID DRUG SCREEN, HOSP PERFORMED  APTT  ETHANOL  BASIC METABOLIC PANEL  PHOSPHORUS  I-STAT TROPOININ, ED  I-STAT CHEM 8, ED    Imaging Review Dg Chest Port 1 View  03/30/2015  CLINICAL DATA:  Nausea, vomiting.  Altered mental status. EXAM: PORTABLE CHEST 1 VIEW COMPARISON:  08/17/2012 FINDINGS: The heart size and mediastinal contours are within normal limits. Both lungs are clear. The visualized skeletal structures are unremarkable. IMPRESSION: No active disease. Electronically Signed   By: Rolm Baptise M.D.   On: 03/30/2015 14:27   I have personally reviewed and evaluated these images and lab results as part of my medical decision-making.   EKG Interpretation   Date/Time:  Wednesday March 30 2015 14:15:08 EST Ventricular Rate:  105 PR Interval:  140 QRS Duration: 155 QT Interval:  410 QTC Calculation: 542 R Axis:   -61 Text Interpretation:  Sinus tachycardia Left bundle branch block Confirmed  by Mackson Botz  MD, Arnitra Sokoloski ([id number]) on 03/30/2015 4:12:52 PM     CRITICAL CARE Performed by: Lita Mains, Aviyah Swetz Total critical care time: 60 minutes Critical care time was exclusive of separately billable procedures and treating other patients. Critical care was necessary to treat or prevent imminent or life-threatening deterioration. Critical care was time spent personally by me on the following activities: development of treatment plan with patient and/or surrogate as well as nursing, discussions with consultants, evaluation of patient's response to treatment, examination of patient, obtaining history from patient or surrogate, ordering and performing treatments and interventions, ordering and review of laboratory studies, ordering and review of radiographic studies, pulse oximetry and re-evaluation of patient's condition. MDM   Final diagnoses:  Renal failure  Hyperkalemia  Sepsis due to urinary tract infection (HCC)   Hyponatremia  Dehydration    Widened QRS compared to baseline. Patient with known chronic renal insufficiency. Patient with worsening confusion and decrease in blood pressure. IV calcium chloride given within improvement of blood pressure, mental status and narrowing of QRS complex. Patient started on IV fluids and given 10 units of IV insulin. Attempt to pass a catheter was met with resistance. It was drainage of a significant amount of purulent material. Bedside ultrasound confirmed decompressed bladder.  Given concern for urinary  sepsis, broad-spectrum antibiotics initiated. Potassium returned greater than 7.5 with an elevation of creatinine to 6 over baseline of 2. Discussed with Dr. Mercy Moore. Will see patient in the emergency department. I had discussion with the patient's sister-in-law who is healthcare power of attorney, Donia Pounds. She stated that based on the discussion she had with the patient 2 years ago, patient was full code. Discussed with critical care,  Dr. Nelda Marseille. Evaluated patient in the emergency department.  Dr. Mercy Moore discussed patient's renal failure with his power of attorney. Decision was made not to proceed with hemodialysis. Dr. Nelda Marseille also discussed with Donia Pounds the futility of resuscitation should dialysis not be an option. The patient was then made DO NOT RESUSCITATE. [facility] hospitalist will admit.    Julianne Rice, MD 03/30/15 [phone]

## 2015-03-30 NOTE — Progress Notes (Signed)
Pt admitted to 3s05. Pt awake, oriented to self only, but able to follow some commands. Vitals stable at this time. Admitted with a stage one pressure ulcer to right hip, foam dressing in place. Condom cath placed for output measurement. Will continue to monitor patient.

## 2015-03-30 NOTE — ED Notes (Signed)
Called for Calcium Chloride. Stated needed to come from Pharmacy. MD at the bedside requesting Immediate from Code Cart

## 2015-03-30 NOTE — ED Notes (Signed)
MD at the bedside  

## 2015-03-30 NOTE — Progress Notes (Signed)
RN, Wylene MenLacey, paged this NP secondary to K level over 7.5. Pt admitted with same K level and continues to be high in spite of multiple treatments. Nephrology offered dialysis and family declined. No peaked T waves on tele. Kayexalate, Cal gluc, IV insulin and D50 ordered. Recheck K later.  KJKG, NP [facility]

## 2015-03-30 NOTE — ED Notes (Signed)
Attempted to call report

## 2015-03-30 NOTE — Progress Notes (Signed)
ANTIBIOTIC CONSULT NOTE - INITIAL  Pharmacy Consult for zosyn Indication: UTI  No Known Allergies  Patient Measurements:   Adjusted Body Weight:   Vital Signs: BP: 143/81 mmHg (12/21 1745) Pulse Rate: 83 (12/21 1745) Intake/Output from previous day:   Intake/Output from this shift:    Labs:  Recent Labs  03/30/15 1345 03/30/15 1504  WBC 24.7*  --   HGB 12.1*  --   PLT 505*  --   CREATININE 6.00* 5.46*   Estimated Creatinine Clearance: 11.5 mL/min (by C-G formula based on Cr of 5.46). No results for input(s): VANCOTROUGH, VANCOPEAK, VANCORANDOM, GENTTROUGH, GENTPEAK, GENTRANDOM, TOBRATROUGH, TOBRAPEAK, TOBRARND, AMIKACINPEAK, AMIKACINTROU, AMIKACIN in the last 72 hours.   Microbiology: No results found for this or any previous visit (from the past 720 hour(s)).  Medical History: Past Medical History  Diagnosis Date  . Alterations of sensations, late effect of cerebrovascular disease   . Acute conjunctivitis, unspecified   . Aortic aneurysm of unspecified site without mention of rupture   . Flaccid hemiplegia affecting dominant side (HCC)   . Type II or unspecified type diabetes mellitus without mention of complication, not stated as uncontrolled   . Depressive disorder, not elsewhere classified   . Unspecified essential hypertension   . Obstructive chronic bronchitis with exacerbation (HCC)   . Anxiety state, unspecified   . Esophageal reflux   . Other and unspecified hyperlipidemia   . Pressure ulcer, other site(707.09)   . Dementia   . Chronic kidney disease 08/04/2011    Acute renal failure per note 08/04/2011-Dr. Art Chilton [place]  . Stroke (HCC)   . Type 2 diabetes mellitus with neurological manifestations, controlled (HCC)     Medications:   (Not in a hospital admission)   Assessment: 74 yo male from nursing home. Starting zosyn for UTI. UA is positive for UTI but pt also has sqaumous cells present.  LA wnl, WBC up to 24, afebrile. Pt is in acute renal  failure, baseline SCr appears to be ~2, current CrCl < 15 ml/min.  Goal of Therapy:  Resolution of infection   Plan:  -Zosyn 2.25 g IV q8h -Watch renal function, cultures, duration of therapy   Baldemar FridayMasters, Sherri Mcarthy M 03/30/2015,6:20 PM

## 2015-03-30 NOTE — ED Notes (Signed)
Called Pharmacy for Calcium Gluconate ASAP. MD at the bedside.

## 2015-03-31 DIAGNOSIS — N19 Unspecified kidney failure: Secondary | ICD-10-CM | POA: Diagnosis present

## 2015-03-31 DIAGNOSIS — L8990 Pressure ulcer of unspecified site, unspecified stage: Secondary | ICD-10-CM | POA: Diagnosis present

## 2015-03-31 DIAGNOSIS — N390 Urinary tract infection, site not specified: Secondary | ICD-10-CM | POA: Diagnosis present

## 2015-03-31 LAB — BASIC METABOLIC PANEL
ANION GAP: 13 (ref 5–15)
BUN: 117 mg/dL — ABNORMAL HIGH (ref 6–20)
CALCIUM: 9.8 mg/dL (ref 8.9–10.3)
CO2: 30 mmol/L (ref 22–32)
Chloride: 94 mmol/L — ABNORMAL LOW (ref 101–111)
Creatinine, Ser: 4.11 mg/dL — ABNORMAL HIGH (ref 0.61–1.24)
GFR calc Af Amer: 15 mL/min — ABNORMAL LOW (ref >60)
GFR calc non Af Amer: 13 mL/min — ABNORMAL LOW (ref >60)
GLUCOSE: 156 mg/dL — AB (ref 65–99)
Potassium: 5.4 mmol/L — ABNORMAL HIGH (ref 3.5–5.1)
Sodium: 137 mmol/L (ref 135–145)

## 2015-03-31 LAB — GLUCOSE, CAPILLARY
GLUCOSE-CAPILLARY: 159 mg/dL — AB (ref 65–99)
GLUCOSE-CAPILLARY: 157 mg/dL — AB (ref 65–99)
Glucose-Capillary: 90 mg/dL (ref 65–99)

## 2015-03-31 LAB — MRSA PCR SCREENING: MRSA by PCR: NEGATIVE

## 2015-03-31 LAB — CBC
HEMATOCRIT: 31.3 % — AB (ref 39.0–52.0)
HEMOGLOBIN: 10.1 g/dL — AB (ref 13.0–17.0)
MCH: 26.4 pg (ref 26.0–34.0)
MCHC: 32.3 g/dL (ref 30.0–36.0)
MCV: 81.9 fL (ref 78.0–100.0)
Platelets: 451 10*3/uL — ABNORMAL HIGH (ref 150–400)
RBC: 3.82 MIL/uL — ABNORMAL LOW (ref 4.22–5.81)
RDW: 14.2 % (ref 11.5–15.5)
WBC: 17.7 10*3/uL — ABNORMAL HIGH (ref 4.0–10.5)

## 2015-03-31 LAB — RENAL FUNCTION PANEL
ANION GAP: 12 (ref 5–15)
Albumin: 2.1 g/dL — ABNORMAL LOW (ref 3.5–5.0)
BUN: 129 mg/dL — ABNORMAL HIGH (ref 6–20)
CALCIUM: 10 mg/dL (ref 8.9–10.3)
CO2: 22 mmol/L (ref 22–32)
Chloride: 96 mmol/L — ABNORMAL LOW (ref 101–111)
Creatinine, Ser: 4.35 mg/dL — ABNORMAL HIGH (ref 0.61–1.24)
GFR calc Af Amer: 14 mL/min — ABNORMAL LOW (ref >60)
GFR calc non Af Amer: 12 mL/min — ABNORMAL LOW (ref >60)
GLUCOSE: 113 mg/dL — AB (ref 65–99)
Phosphorus: 6.5 mg/dL — ABNORMAL HIGH (ref 2.5–4.6)
Potassium: 6.9 mmol/L (ref 3.5–5.1)
SODIUM: 130 mmol/L — AB (ref 135–145)

## 2015-03-31 LAB — HEMOGLOBIN A1C
HEMOGLOBIN A1C: 7.5 % — AB (ref 4.8–5.6)
Mean Plasma Glucose: 169 mg/dL

## 2015-03-31 MED ORDER — SODIUM POLYSTYRENE SULFONATE 15 GM/60ML PO SUSP
45.0000 g | Freq: Once | ORAL | Status: AC
  Administered 2015-03-31: 45 g via RECTAL
  Filled 2015-03-31: qty 180

## 2015-03-31 MED ORDER — DEXTROSE 50 % IV SOLN
1.0000 | Freq: Once | INTRAVENOUS | Status: AC
  Administered 2015-03-31: 50 mL via INTRAVENOUS
  Filled 2015-03-31: qty 50

## 2015-03-31 MED ORDER — SODIUM CHLORIDE 0.9 % IV SOLN
2.0000 g | Freq: Once | INTRAVENOUS | Status: AC
  Administered 2015-03-31: 2 g via INTRAVENOUS
  Filled 2015-03-31: qty 20

## 2015-03-31 MED ORDER — INSULIN ASPART 100 UNIT/ML IV SOLN
10.0000 [IU] | Freq: Once | INTRAVENOUS | Status: AC
  Administered 2015-03-31: 10 [IU] via INTRAVENOUS
  Filled 2015-03-31: qty 0.1

## 2015-03-31 MED ORDER — INSULIN ASPART 100 UNIT/ML ~~LOC~~ SOLN: 0.0000 [IU] | Freq: Four times a day (QID) | SUBCUTANEOUS | Status: DC

## 2015-03-31 MED ORDER — INSULIN ASPART 100 UNIT/ML ~~LOC~~ SOLN
0.0000 [IU] | Freq: Four times a day (QID) | SUBCUTANEOUS | Status: DC
  Administered 2015-03-31 (×2): 2 [IU] via SUBCUTANEOUS
  Administered 2015-04-01: 5 [IU] via SUBCUTANEOUS
  Administered 2015-04-01: 2 [IU] via SUBCUTANEOUS
  Administered 2015-04-01: 3 [IU] via SUBCUTANEOUS

## 2015-03-31 NOTE — Progress Notes (Signed)
S: Nods no when I asked if any pain or CO O:BP 99/54 mmHg  Pulse 88  Temp(Src) 98 F (36.7 C) (Oral)  Resp 17  Ht 6' (1.829 m)  Wt 64.6 kg (142 lb 6.7 oz)  BMI 19.31 kg/m2  SpO2 100%  Intake/Output Summary (Last 24 hours) at 03/31/15 1116 Last data filed at 03/31/15 0800  Gross per 24 hour  Intake 1897.5 ml  Output    450 ml  Net 1447.5 ml   Weight change:  WUJ:WJXBJYNWGen:Sleeping but arousable CVS:RRR Resp:clear Abd:+ BS NTND Ext:No edema NEURO:Lt sided paralysis   . antiseptic oral rinse  7 mL Mouth Rinse q12n4p  . calcium gluconate  2 g Intravenous Once  . chlorhexidine  15 mL Mouth Rinse BID  . heparin  5,000 Units Subcutaneous 3 times per day  . insulin aspart  0-9 Units Subcutaneous Q6H  . piperacillin-tazobactam (ZOSYN)  IV  2.25 g Intravenous 3 times per day  . sodium chloride  3 mL Intravenous Q12H   [place] Renal Port  03/30/2015  CLINICAL DATA:  Acute renal failure, history of ureteral stents and bilateral nephrolithiasis EXAM: RENAL / URINARY TRACT ULTRASOUND COMPLETE COMPARISON:  06/18/2012, 07/09/2014 FINDINGS: Right Kidney: Length: 10.7 cm. Mild hydronephrosis. Echogenic shadowing mid and lower pole renal calculi. Proximal ureter is mildly dilated. Ureteral stent noted. Left Kidney: Length: 12.8 cm. Mild hydronephrosis. Ureteral stent in place. Echogenic shadowing intrarenal calculi measuring 2 cm in the upper pole and 1.8 cm in the lower pole. Upper pole renal cyst measures 4.3 cm. Bladder: Ureteral stents noted in the bladder.  Bladder is underdistended. IMPRESSION: Mild bilateral hydronephrosis. Bilateral nephrolithiasis Bilateral ureteral stents evident. Electronically Signed   By: Judie PetitM.  Shick M.D.   On: 03/30/2015 17:25   Dg Chest Port 1 View  03/30/2015  CLINICAL DATA:  Nausea, vomiting.  Altered mental status. EXAM: PORTABLE CHEST 1 VIEW COMPARISON:  08/17/2012 FINDINGS: The heart size and mediastinal contours are within normal limits. Both lungs are clear. The visualized  skeletal structures are unremarkable. IMPRESSION: No active disease. Electronically Signed   By: Charlett NoseKevin  Dover M.D.   On: 03/30/2015 14:27   BMET    Component Value Date/Time   NA 130* 03/31/2015 0500   NA 145 07/02/2014   K 6.9* 03/31/2015 0500   CL 96* 03/31/2015 0500   CO2 22 03/31/2015 0500   GLUCOSE 113* 03/31/2015 0500   BUN 129* 03/31/2015 0500   BUN 42* 07/02/2014   CREATININE 4.35* 03/31/2015 0500   CREATININE 3.3* 07/02/2014   CALCIUM 10.0 03/31/2015 0500   GFRNONAA 12* 03/31/2015 0500   GFRAA 14* 03/31/2015 0500   CBC    Component Value Date/Time   WBC 17.7* 03/31/2015 0248   WBC 10.8 07/02/2014   RBC 3.82* 03/31/2015 0248   HGB 10.1* 03/31/2015 0248   HCT 31.3* 03/31/2015 0248   PLT 451* 03/31/2015 0248   MCV 81.9 03/31/2015 0248   MCH 26.4 03/31/2015 0248   MCHC 32.3 03/31/2015 0248   RDW 14.2 03/31/2015 0248   LYMPHSABS 0.7 03/30/2015 1345   MONOABS 1.0 03/30/2015 1345   EOSABS 0.0 03/30/2015 1345   BASOSABS 0.0 03/30/2015 1345     Assessment: 1. Acute on CKD 4, Scr trending down 2. Hyperkalemia, improving 3. Bil ureteral obstruction, partial as he is making some urine 4. SP stroke with Rt sided paralysis 5. Hx renal stones  Plan: 1. Note plans to recheck K 2. Cont IV hydration 3. He has stents with evidence of  bil obstruction.  I would ask urology to see pt and get a game plan on these stents.  Suspect hydro may worsen with volume repletion 4. Daily Scr   Dylen Mcelhannon T

## 2015-03-31 NOTE — Clinical Social Work Note (Addendum)
CSW informed by Reyne Dumasammy Blakely, nurse liasion that patient is a long-term care resident at [facility]. If patient needs rehab on return, he will need a 3-day qualifying stay per Tammy.  CSW will f/u with patient to confirm return to facility at discharge.   Genelle BalVanessa Lemmie Steinhaus, MSW, LCSW Licensed Clinical Social Worker Clinical Social Work Department [facility] [phone]

## 2015-03-31 NOTE — Evaluation (Signed)
Clinical/Bedside Swallow Evaluation Patient Details  Name: Justin Sherman MRN: [id number] Wrightsonof Sherman: 25-Sep-1940  Today's Date: 03/31/2015 Time: SLP Start Time (ACUTE ONLY): 1344 SLP Stop Time (ACUTE ONLY): 1356 SLP Time Calculation (min) (ACUTE ONLY): 12 min  Past Medical History:  Past Medical History  Diagnosis Date  . Alterations of sensations, late effect of cerebrovascular disease   . Acute conjunctivitis, unspecified   . Aortic aneurysm of unspecified site without mention of rupture   . Flaccid hemiplegia affecting dominant side (HCC)   . Type II or unspecified type diabetes mellitus without mention of complication, not stated as uncontrolled   . Depressive disorder, not elsewhere classified   . Unspecified essential hypertension   . Obstructive chronic bronchitis with exacerbation (HCC)   . Anxiety state, unspecified   . Esophageal reflux   . Other and unspecified hyperlipidemia   . Pressure ulcer, other site(707.09)   . Dementia   . Chronic kidney disease 08/04/2011    Acute renal failure per note 08/04/2011-Dr. Art Chilton [place]  . Stroke (HCC)   . Type 2 diabetes mellitus with neurological manifestations, controlled [facility])    Past Surgical History:  Past Surgical History  Procedure Laterality Date  . Cystoscopy w/ ureteral stent placement  08/07/2011    Procedure: CYSTOSCOPY WITH RETROGRADE PYELOGRAM/URETERAL STENT PLACEMENT;  Surgeon: Justin Ludwig, MD;  Location: [facility];  Service: Urology;  Laterality: Bilateral;  bilateral stent placement ureterosopy  . Cystoscopy/retrograde/ureteroscopy  12/07/2011    Procedure: CYSTOSCOPY/RETROGRADE/URETEROSCOPY;  Surgeon: Justin Ludwig, MD;  [facility];  Service: Urology;  Laterality: Bilateral;  . Cystoscopy w/ ureteral stent placement  12/07/2011    Procedure: CYSTOSCOPY WITH STENT REPLACEMENT;  Surgeon: Justin Ludwig, MD;  Location: [facility];  Service: Urology;  Laterality: Bilateral;  (BIL) JJ STENT EXCHANGE   . Cystoscopy with urethral dilatation  12/07/2011    Procedure: CYSTOSCOPY WITH URETHRAL DILATATION;  Surgeon: Justin Ludwig, MD;  Location: [facility];  Service: Urology;;   HPI:  Patient presented on 12/21 with altered mental state from nursing home. Per report patient had acute change in mental status, was not responding to verbal commands which he usually does. Patient became completely nonverbal and tachypnea At the nursing home. Patient admitted with AMS, hyperglycemia, and hyperkalemia.    Assessment / Plan / Recommendation Clinical Impression  Pt is impulsive with self-fed trials, resulting in a wet vocal quality with thin liquids that does clear with a cued cough. With [facility] assist from SLP for appropriate pacing, pt consumes thin liquids and purees without incidence. 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 Liquid washes are more effective at clearing than cues for lingual sweep. Recommend Dys 2 diet and thin liquids with full supervision for safety.    Aspiration Risk  Mild aspiration risk    Diet Recommendation  Dys 2 diet, thin liquids Full supervision   Medication Administration: Crushed with puree    Other  Recommendations Oral Care Recommendations: Oral care BID   Follow up Recommendations   (tba)    Frequency and Duration min 2x/week  2 weeks       Prognosis Prognosis for Safe Diet Advancement: Fair Barriers to Reach Goals: Other (Comment) (?PLOF)      Swallow Study   General HPI: Patient presented on 12/21 with altered mental state from nursing home. Per report patient had acute change in mental status, was not responding to verbal commands which  he usually does. Patient became completely nonverbal and tachypnea At the nursing home. Patient admitted with AMS, hyperglycemia, and hyperkalemia.  Type of Study: Bedside Swallow Evaluation Previous Swallow Assessment: BSE in 2013 recommending  regular diet, thin liquids Diet Prior to this Study: NPO Temperature Spikes Noted: No Respiratory Status: Room air History of Recent Intubation: No Behavior/Cognition: Alert;Cooperative;Pleasant mood Oral Cavity Assessment: Within Functional Limits Oral Care Completed by SLP: No Oral Cavity - Dentition: Adequate natural dentition Self-Feeding Abilities: Able to feed self Patient Positioning: Upright in bed Baseline Vocal Quality: Normal    Oral/Motor/Sensory Function Overall Oral Motor/Sensory Function:  (baseline impairment on right)   Ice Chips Ice chips: Within functional limits Presentation: Spoon   Thin Liquid Thin Liquid: Impaired Presentation: Cup;Self Fed;Straw Pharyngeal  Phase Impairments: Suspected delayed Swallow;Wet Vocal Quality    Nectar Thick Nectar Thick Liquid: Not tested   Honey Thick Honey Thick Liquid: Not tested   Puree Puree: Within functional limits Presentation: Spoon   Solid Solid: Impaired Oral Phase Impairments: Poor awareness of bolus;Reduced lingual movement/coordination Oral Phase Functional Implications: Left lateral sulci pocketing      Justin Sherman, M.A. CCC-SLP [phone]  Justin Sherman, Justin Sherman 03/31/2015,2:04 PM

## 2015-03-31 NOTE — Progress Notes (Signed)
UR COMPLETED  

## 2015-03-31 NOTE — Progress Notes (Signed)
Initial Nutrition Assessment  DOCUMENTATION CODES:   Severe malnutrition in context of chronic illness  INTERVENTION:    Diet advancement per SLP/MD after swallow evaluation.  NUTRITION DIAGNOSIS:   Malnutrition related to chronic illness as evidenced by severe depletion of muscle mass, percent weight loss (12% weight loss within the past 3 months).  GOAL:   Patient will meet greater than or equal to 90% of their needs  MONITOR:   Diet advancement, PO intake, Labs, Weight trends, Other (Comment) (overall goals of care)  REASON FOR ASSESSMENT:   Low Braden    ASSESSMENT:   Patient presented on 12/21 with altered mental state from nursing home. Per report patient had acute change in mental status, was not responding to verbal commands which he usually does. Patient became completely nonverbal and tachypnea At the nursing home. Patient admitted with AMS, hyperglycemia, and hyperkalemia.  Labs reviewed: sodium low; potassium, phosphorus, BUN, creatinine elevated.  Family has declined HD and intubation. He is a DNR. Palliative Care Team has been consulted. Plans for swallow evaluation with SLP today. Per RN, patient seems hungry.   Nutrition-Focused physical exam completed. Findings are mild-moderate fat depletion, severe muscle depletion, and no edema. Patient with 12% weight loss within the past 1.5 months. Patient with severe PCM.  Diet Order:  Diet NPO time specified  Skin:  Wound (see comment) (small stage 1 pressure ulcer to hip)  Last BM:  12/22  Height:   Ht Readings from Last 1 Encounters:  03/30/15 6' (1.829 m)    Weight:   Wt Readings from Last 1 Encounters:  03/30/15 142 lb 6.7 oz (64.6 kg)    Ideal Body Weight:  80.9 kg  BMI:  Body mass index is 19.31 kg/(m^2).  Estimated Nutritional Needs:   Kcal:  2000-2200  Protein:  100-120 gm  Fluid:  2 L  EDUCATION NEEDS:   No education needs identified at this time  Joaquin CourtsKimberly Lenia Housley, RD, LDN,  CNSC Pager [phone] After Hours Pager [phone]

## 2015-03-31 NOTE — Progress Notes (Signed)
[facility] PROGRESS NOTE  Justin Sherman RUE:[id number] DOB: [birth year] DOA: 03/30/2015 PCP: Margit Hanks, MD  Assessment/Plan: #1 acute encephalopathy Likely multifactorial secondary to acute urinary tract infection, uremia, hyponatremia in the setting of dementia. Patient opens eyes to verbal stimuli however drifts back to sleep. Patient has been pancultured cultures are pending. Chest x-ray on admission was negative. Continue IV fluids, IV Zosyn, supportive care. Follow.  #2 acute on chronic kidney disease stage IV/acidosis/uremia Likely secondary to prerenal azotemia secondary to volume depletion plus or minus ATN. Renal function slowly trending down since admission and creatinine currently at 4.35 from 6.0. Renal ultrasound with mild bilateral hydronephrosis, bilateral ureteral stents evident, bilateral nephrolithiasis. Patient was seen in consultation by nephrology who spoke with family at length and conservative treatment with medical therapy without dialysis at this time. Continue bicarbonate, fluids. Follow.  #3 hyperkalemia Likely secondary to problem #2. Patient given Kayexalate, Calcium gluconate, insulin/glucose, IV fluids with some improvement in hyperkalemia however still elevated. Potassium levels in 6.9 this morning from greater than 7.5 on admission. Patient was seen in consultation by nephrology who spoke at length with the family and current decision is medical therapy without dialysis at this time. Will repeat EKG. Will give another dose of Kayexalate, another dose of calcium gluconate, insulin and glucose. Continue hydration.Repeat BMET this afternoon.  #4 the dehydration IV fluids.  #5 urinary tract infection Urine cultures pending. Continue IV Zosyn.  #6 EKG with new left bundle branch block/sinus tachycardia Repeat EKG. Will await palliative care meeting prior to getting a 2-D echo and further workup.  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 Admission M.D. SLP. PT. OT.  #8 hyponatremia Likely secondary to hypovolemic hyponatremia and acute renal failure. Sodium levels improving with hydration. Follow.  #9 diabetes mellitus Check a hemoglobin A1c. Change CBGs to every 6 hours. Continue sliding scale insulin.  #10 history of bilateral hydronephrosis status post ureteral stents Renal ultrasound with stents in place. Renal function is slowly trending down with medical therapy. Follow for now.  #11 prophylaxis Heparin for DVT prophylaxis.  #12 Prognosis Patient likely approaching end-of-life. Patient with multiple comorbidities including chronic kidney disease stage IV now in acute on chronic kidney disease with hyperkalemia metabolic acidosis and family not wanting dialysis at this time patient likely not a good candidate. Patient is a DO NOT RESUSCITATE and history of CVA with right-sided paralysis and aphasia. Palliative care consultation pending for goals of care.       Code Status: DO NOT RESUSCITATE Family Communication: No family at bedside.  Disposition Plan: Remain in step down unit. Pending palliative care evaluation. Probably back to facility when medically stable.    Consultants:  Nephrology: Dr. Briant Cedar 03/30/2015   PCCM; Dr Molli Knock 03/30/2015  Procedures:  Renal ultrasound 03/30/2015  Chest x-ray 03/30/2015    Antibiotics:  IV Zosyn 03/30/2015  HPI/Subjective: Patient sleepy drowsy. Patient opens eyes to verbal stimuli. Patient drifts back off to sleep.  Objective: Filed Vitals:   03/31/15 0700 03/31/15 0727  BP:  99/54  Pulse:  88  Temp: 98 F (36.7 C)   Resp:  17    Intake/Output Summary (Last 24 hours) at 03/31/15 0936 Last data filed at 03/31/15 0800  Gross per 24 hour  Intake 1897.5 ml  Output    450 ml  Net 1447.5 ml   Filed Weights   03/30/15 2150  Weight: 64.6 kg (142 lb 6.7 oz)  Exam:   General:   NAD.Drowsy. Extremely dry mucous membranes.  Cardiovascular: RRR  Respiratory: CTAB anterior lung fields.  Abdomen: Soft, nontender, nondistended, positive bowel sounds.  Musculoskeletal: No clubbing cyanosis or edema.  Data Reviewed: Basic Metabolic Panel:  Recent Labs Lab 03/30/15 1345 03/30/15 1504 03/30/15 1835 03/31/15 0500  NA 124* 128* 128* 130*  K >7.5* QUESTIONABLE RESULTS, RECOMMEND RECOLLECT TO VERIFY >7.5* 6.9*  CL 93* 100* 101 96*  CO2 13* 10* 17* 22  GLUCOSE 303* 207* 154* 113*  BUN 153* 147* 136* 129*  CREATININE 6.00* 5.46* 4.94* 4.35*  CALCIUM 10.6* 11.5* 10.1 10.0  MG 3.2*  --   --   --   PHOS  --  9.2*  --  6.5*   Liver Function Tests:  Recent Labs Lab 03/30/15 1345 03/31/15 0500  AST 26  --   ALT 16*  --   ALKPHOS 99  --   BILITOT 0.4  --   PROT 9.2*  --   ALBUMIN 2.8* 2.1*   No results for input(s): LIPASE, AMYLASE in the last 168 hours.  Recent Labs Lab 03/30/15 1525  AMMONIA 31   CBC:  Recent Labs Lab 03/30/15 1345 03/31/15 0248  WBC 24.7* 17.7*  NEUTROABS 23.0*  --   HGB 12.1* 10.1*  HCT 38.4* 31.3*  MCV 83.3 81.9  PLT 505* 451*   Cardiac Enzymes: No results for input(s): CKTOTAL, CKMB, CKMBINDEX, TROPONINI in the last 168 hours. BNP (last 3 results) No results for input(s): BNP in the last 8760 hours.  ProBNP (last 3 results) No results for input(s): PROBNP in the last 8760 hours.  CBG:  Recent Labs Lab 03/30/15 1415 03/30/15 1832 03/30/15 2004 03/30/15 2151 03/31/15 0822  GLUCAP 290* 177* 138* 134* 90    Recent Results (from the past 240 hour(s))  MRSA PCR Screening     Status: None   Collection Time: 03/30/15 10:06 PM  Result Value Ref Range Status   MRSA by PCR NEGATIVE NEGATIVE Final    Comment:        The GeneXpert MRSA Assay (FDA approved for NASAL specimens only), is one component of a comprehensive MRSA colonization surveillance program. It is not intended to diagnose MRSA infection nor to  guide or monitor treatment for MRSA infections.      Studies: US Renal Port  03/30/2015  CLINICAL DATA:  Acute renal failure, history of ureteral stents and bilateral nephrolithiasis EXAM: RENAL / URINARY TRACT ULTRASOUND COMPLETE COMPARISON:  06/18/2012, 07/09/2014 FINDINGS: Right Kidney: Length: 10.7 cm. Mild hydronephrosis. Echogenic shadowing mid and lower pole renal calculi. Proximal ureter is mildly dilated. Ureteral stent noted. Left Kidney: Length: 12.8 cm. Mild hydronephrosis. Ureteral stent in place. Echogenic shadowing intrarenal calculi measuring 2 cm in the upper pole and 1.8 cm in the lower pole. Upper pole renal cyst measures 4.3 cm. Bladder: Ureteral stents noted in the bladder.  Bladder is underdistended. IMPRESSION: Mild bilateral hydronephrosis. Bilateral nephrolithiasis Bilateral ureteral stents evident. Electronically Signed   By: Judie Petit.  Shick M.D.   On: 03/30/2015 17:25   Dg Chest Port 1 View  03/30/2015  CLINICAL DATA:  Nausea, vomiting.  Altered mental status. EXAM: PORTABLE CHEST 1 VIEW COMPARISON:  08/17/2012 FINDINGS: The heart size and mediastinal contours are within normal limits. Both lungs are clear. The visualized skeletal structures are unremarkable. IMPRESSION: No active disease. Electronically Signed   By: Charlett Nose M.D.   On: 03/30/2015 14:27    Scheduled Meds: . antiseptic oral rinse  7 mL Mouth Rinse q12n4p  . chlorhexidine  15 mL Mouth Rinse BID  . heparin  5,000 Units Subcutaneous 3 times per day  . insulin aspart  0-5 Units Subcutaneous QHS  . insulin aspart  0-9 Units Subcutaneous TID WC  . piperacillin-tazobactam (ZOSYN)  IV  2.25 g Intravenous 3 times per day  . sodium chloride  3 mL Intravenous Q12H   Continuous Infusions: .  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 If 7PM-7AM, please contact night-coverage at [email or web address], password [email or web address] 03/31/2015, 9:36 AM  LOS: 1 day

## 2015-04-01 LAB — CBC WITH DIFFERENTIAL/PLATELET
Basophils Absolute: 0 10*3/uL (ref 0.0–0.1)
Basophils Relative: 0 %
EOS ABS: 0 10*3/uL (ref 0.0–0.7)
EOS PCT: 0 %
HCT: 24.3 % — ABNORMAL LOW (ref 39.0–52.0)
Hemoglobin: 7.8 g/dL — ABNORMAL LOW (ref 13.0–17.0)
Lymphocytes Relative: 7 %
Lymphs Abs: 0.9 10*3/uL (ref 0.7–4.0)
MCH: 26.2 pg (ref 26.0–34.0)
MCHC: 32.1 g/dL (ref 30.0–36.0)
MCV: 81.5 fL (ref 78.0–100.0)
MONO ABS: 0.9 10*3/uL (ref 0.1–1.0)
Monocytes Relative: 7 %
NEUTROS ABS: 10 10*3/uL — AB (ref 1.7–7.7)
NEUTROS PCT: 85 %
PLATELETS: 287 10*3/uL (ref 150–400)
RBC: 2.98 MIL/uL — ABNORMAL LOW (ref 4.22–5.81)
RDW: 14 % (ref 11.5–15.5)
WBC: 11.7 10*3/uL — ABNORMAL HIGH (ref 4.0–10.5)

## 2015-04-01 LAB — GLUCOSE, CAPILLARY
GLUCOSE-CAPILLARY: 213 mg/dL — AB (ref 65–99)
GLUCOSE-CAPILLARY: 293 mg/dL — AB (ref 65–99)
Glucose-Capillary: 185 mg/dL — ABNORMAL HIGH (ref 65–99)
Glucose-Capillary: 221 mg/dL — ABNORMAL HIGH (ref 65–99)

## 2015-04-01 LAB — RENAL FUNCTION PANEL
ALBUMIN: 1.7 g/dL — AB (ref 3.5–5.0)
ANION GAP: 12 (ref 5–15)
BUN: 101 mg/dL — ABNORMAL HIGH (ref 6–20)
CALCIUM: 8.7 mg/dL — AB (ref 8.9–10.3)
CO2: 36 mmol/L — ABNORMAL HIGH (ref 22–32)
Chloride: 86 mmol/L — ABNORMAL LOW (ref 101–111)
Creatinine, Ser: 3.61 mg/dL — ABNORMAL HIGH (ref 0.61–1.24)
GFR calc non Af Amer: 15 mL/min — ABNORMAL LOW (ref >60)
GFR, EST AFRICAN AMERICAN: 17 mL/min — AB (ref >60)
Glucose, Bld: 205 mg/dL — ABNORMAL HIGH (ref 65–99)
PHOSPHORUS: 5.3 mg/dL — AB (ref 2.5–4.6)
POTASSIUM: 3.6 mmol/L (ref 3.5–5.1)
SODIUM: 134 mmol/L — AB (ref 135–145)

## 2015-04-01 LAB — URINE CULTURE

## 2015-04-01 MED ORDER — INSULIN ASPART 100 UNIT/ML ~~LOC~~ SOLN
0.0000 [IU] | Freq: Three times a day (TID) | SUBCUTANEOUS | Status: DC
  Administered 2015-04-01 – 2015-04-02 (×3): 3 [IU] via SUBCUTANEOUS
  Administered 2015-04-02: 2 [IU] via SUBCUTANEOUS
  Administered 2015-04-04: 1 [IU] via SUBCUTANEOUS

## 2015-04-01 MED ORDER — SODIUM CHLORIDE 0.9 % IV SOLN
INTRAVENOUS | Status: DC
Start: 2015-04-01 — End: 2015-04-05
  Administered 2015-04-01 – 2015-04-03 (×3): via INTRAVENOUS

## 2015-04-01 NOTE — Progress Notes (Signed)
Speech Language Pathology Treatment: Dysphagia  Patient Details Name: Justin MiniumCharles Hester MRN: [id number] DOB: 03/02/1941 Today's Date: 04/01/2015 Time: [id number] SLP Time Calculation (min) (ACUTE ONLY): 15 min  Assessment / Plan / Recommendation Clinical Impression  Pt demonstrates function consistent with eval. Pt has wet vocal quality/slight throat clearing with large sips, which is his behavior. He requires tactile cues to take small sips and to follow recommended precautions. . Pt is now on comfort measures and would continue to allow pt to eat and drink with risk following precautions as best as possible.  No further intervention in acute level of care needed. Defer any f/u to SNF SLP.    HPI HPI: Patient presented on 12/21 with altered mental state from nursing home. Per report patient had acute change in mental status, was not responding to verbal commands which he usually does. Patient became completely nonverbal and tachypnea At the nursing home. Patient admitted with AMS, hyperglycemia, and hyperkalemia.       SLP Plan  Continue with current plan of care     Recommendations  Diet recommendations: Dysphagia 2 (fine chop);Thin liquid Liquids provided via: Cup;No straw Medication Administration: Crushed with puree Supervision: Staff to assist with self feeding Compensations: Slow rate;Small sips/bites;Follow solids with liquid Postural Changes and/or Swallow Maneuvers: Seated upright 90 degrees              Oral Care Recommendations: Oral care BID Follow up Recommendations: Skilled Nursing facility Plan: Continue with current plan of care   Lavarius Doughten, Riley NearingBonnie Caroline 04/01/2015, 1:41 PM

## 2015-04-01 NOTE — Consult Note (Signed)
Goals of care meeting with patient brother Justin Sherman and Legal Guardian Justin Sherman.  Paperwork on file in chart.  Brief history of Mr. Justin Sherman was reviewed.  [facility]  He considers this to be his home.  Several years ago he suffered a CVA that resulted in right sided hemiplegia as well as aphasia.  He did remained mostly healthy until the past 6 months or so when he began to loose weight and exhibited increase AMS when family visited.  Justin Sherman and Justin Sherman are both very familiar with hospice and the benefits including care in place with and emphasis on quality of life and relief of suffering.  Goals expressed by both Justin Sherman and Justin Sherman are that Justin Sherman be discharged back to SNF when medically stable with referral for hospice in place at the facility.   Justin Sherman is the legal guardian for any referral contact.  Spoke with Justin Sherman, GL RN Liaison with possible plan.  Recommendation is for comfort care, discharge with comfort care plan including hospice referral to begin when he returns to SNF.  Will follow to transition.  Cindra PresumeSue Ellen Sneijder Bernards, RN-BC, MSN, [facility]

## 2015-04-01 NOTE — Progress Notes (Signed)
Pharmacy Antibiotic Follow-up Note  Justin Sherman is a 74 y.o. year-old male admitted on 03/30/2015. The patient is currently on day 2 of Zosyn for UTI (POA) in setting of uretal stent placement.  Assessment/Plan: 1. Continue Zosyn 2.25 gram IV q8h infused over 30 minutes based on present renal fxn 2. Await px micro data and deescalate antibiotics as feasible   Temp (24hrs), Avg:97.8 F (36.6 C), Min:97.2 F (36.2 C), Max:98.5 F (36.9 C)   Recent Labs Lab 03/30/15 1345 03/31/15 0248 04/01/15 0259  WBC 24.7* 17.7* 11.7*    Recent Labs Lab 03/30/15 1504 03/30/15 1835 03/31/15 0500 03/31/15 1240 04/01/15 0259  CREATININE 5.46* 4.94* 4.35* 4.11* 3.61*  *CKD 4 at baseline*  No Known Allergies  Antimicrobials this admission: 12/21 Zosyn >>   Levels/dose changes this admission: None to date   Microbiology results: 12/21 BCx: ngtd 12/21 UCx: "multiple species present suggest recollect "    Thank you for allowing pharmacy to be a part of this patient's care.  Pollyann SamplesAndy Tyechia Allmendinger, PharmD, BCPS 04/01/2015, 2:05 PM Pager: [phone]

## 2015-04-01 NOTE — Progress Notes (Signed)
Subjective:  Is 3 liters positive overnight- now on O2- HR up and down- now 40- nearly 2 liters of urine- BUN and crt down as well as K Objective Vital signs in last 24 hours: Filed Vitals:   04/01/15 0514 04/01/15 0522 04/01/15 0525 04/01/15 0732  BP:    120/68  Pulse: 82 92 92 54  Temp:    98.3 F (36.8 C)  TempSrc:    Oral  Resp: 19 25 11    Height:      Weight:      SpO2: 83% 98% 97%    Weight change:   Intake/Output Summary (Last 24 hours) at 04/01/15 0804 Last data filed at 04/01/15 0725  Gross per 24 hour  Intake 4432.5 ml  Output   1900 ml  Net 2532.5 ml    Assessment/ Plan: Pt is a 74 y.o. yo male NH pt with CVA who was admitted on 03/30/2015 with A on CRF with hyperkalemia in setting of volume depletion as well as possible obstruction  Assessment/Plan: 1. A on CRF- improving with hydration alone so far.  However, urine with pyuria and history of stents possibly in long term with bilateral hydro on imaging- would consider getting urology to see and see if stents could be removed or changed in effort to improve renal function. Has been determined to not be a dialysis candidate due to baseline poor health and function 2. ID- pyuria- will be difficult to clear with stents in place- on zosyn.   3. HTN/vol- I think now that tank is full- also is alkalotic with sodium bicarb so will change IVF to NS at 50 per hour- will third space due to hypoalbuminemia 4. Anemia- has been brought out with hydration- check iron stores- transfuse as needed  5. Hyperkalemia- resolved- now hypo- will stop bicarb 6. Bradycardia- unknown etiology- per primary 7. Dispo- DNR and not to do dialysis - palliative care consult pending    Justin Sherman A    Labs: Basic Metabolic Panel:  Recent Labs Lab 03/30/15 1504  03/31/15 0500 03/31/15 1240 04/01/15 0259  NA 128*  < > 130* 137 134*  K QUESTIONABLE RESULTS, RECOMMEND RECOLLECT TO VERIFY  < > 6.9* 5.4* 3.6  CL 100*  < > 96* 94* 86*   CO2 10*  < > 22 30 36*  GLUCOSE 207*  < > 113* 156* 205*  BUN 147*  < > 129* 117* 101*  CREATININE 5.46*  < > 4.35* 4.11* 3.61*  CALCIUM 11.5*  < > 10.0 9.8 8.7*  PHOS 9.2*  --  6.5*  --  5.3*  < > = values in this interval not displayed. Liver Function Tests:  Recent Labs Lab 03/30/15 1345 03/31/15 0500 04/01/15 0259  AST 26  --   --   ALT 16*  --   --   ALKPHOS 99  --   --   BILITOT 0.4  --   --   PROT 9.2*  --   --   ALBUMIN 2.8* 2.1* 1.7*   No results for input(s): LIPASE, AMYLASE in the last 168 hours.  Recent Labs Lab 03/30/15 1525  AMMONIA 31   CBC:  Recent Labs Lab 03/30/15 1345 03/31/15 0248 04/01/15 0259  WBC 24.7* 17.7* 11.7*  NEUTROABS 23.0*  --  10.0*  HGB 12.1* 10.1* 7.8*  HCT 38.4* 31.3* 24.3*  MCV 83.3 81.9 81.5  PLT 505* 451* 287   Cardiac Enzymes: No results for input(s): CKTOTAL, CKMB, CKMBINDEX, TROPONINI in the last  168 hours. CBG:  Recent Labs Lab 03/31/15 0822 03/31/15 1215 03/31/15 1754 04/01/15 0122 04/01/15 0556  GLUCAP 90 159* 157* 213* 185*    Iron Studies: No results for input(s): IRON, TIBC, TRANSFERRIN, FERRITIN in the last 72 hours. Studies/Results: US Renal Port  03/30/2015  CLINICAL DATA:  Acute renal failure, history of ureteral stents and bilateral nephrolithiasis EXAM: RENAL / URINARY TRACT ULTRASOUND COMPLETE COMPARISON:  06/18/2012, 07/09/2014 FINDINGS: Right Kidney: Length: 10.7 cm. Mild hydronephrosis. Echogenic shadowing mid and lower pole renal calculi. Proximal ureter is mildly dilated. Ureteral stent noted. Left Kidney: Length: 12.8 cm. Mild hydronephrosis. Ureteral stent in place. Echogenic shadowing intrarenal calculi measuring 2 cm in the upper pole and 1.8 cm in the lower pole. Upper pole renal cyst measures 4.3 cm. Bladder: Ureteral stents noted in the bladder.  Bladder is underdistended. IMPRESSION: Mild bilateral hydronephrosis. Bilateral nephrolithiasis Bilateral ureteral stents evident. Electronically  Signed   By: Judie Petit.  Shick M.D.   On: 03/30/2015 17:25   Dg Chest Port 1 View  03/30/2015  CLINICAL DATA:  Nausea, vomiting.  Altered mental status. EXAM: PORTABLE CHEST 1 VIEW COMPARISON:  08/17/2012 FINDINGS: The heart size and mediastinal contours are within normal limits. Both lungs are clear. The visualized skeletal structures are unremarkable. IMPRESSION: No active disease. Electronically Signed   By: Charlett Nose M.D.   On: 03/30/2015 14:27   Medications: Infusions: .  sodium bicarbonate 150 mEq in sterile water 1000 mL infusion 150 mEq (04/01/15 0604)    Scheduled Medications: . antiseptic oral rinse  7 mL Mouth Rinse q12n4p  . chlorhexidine  15 mL Mouth Rinse BID  . heparin  5,000 Units Subcutaneous 3 times per day  . insulin aspart  0-9 Units Subcutaneous Q6H  . piperacillin-tazobactam (ZOSYN)  IV  2.25 g Intravenous 3 times per day  . sodium chloride  3 mL Intravenous Q12H    have reviewed scheduled and prn medications.  Physical Exam: General: eyes open- nods- non verbal except HI Heart: bradycardic Lungs: mostly clear Abdomen: soft, non tender Extremities: minimal edema    04/01/2015,8:04 AM  LOS: 2 days

## 2015-04-01 NOTE — Progress Notes (Signed)
I spoke with patient's family including Harriett Sineancy, his POA, and Chanetta MarshallJimmy, his brother, via phone today.  Family's goal is to continue with his current care and see how he responds over the next 24-48 hours.  They are in agreement that we should see how he does for a period of time with his current care plan prior to consideration for urology consultation for possible removal of ureteral stents. If he does not improve over this time, they would like to pursue placement back to the SNF where he has been living with hospice support at that time. If he does continue to improve, they would like to continue with this hospitalization until he is medically optimized followed by transition back to his current SNF.    Our team will continue to shadow chart over the holiday weekend to monitor his clinical progress. If the patient would benefit from a visit from a palliative provider before Monday, please feel free to contact our team at [phone] and we will be happy to assist in anyway we are able.  Romie MinusGene Saraphina Lauderbaugh, MD [facility] Team [phone]

## 2015-04-01 NOTE — Progress Notes (Signed)
[place] TEAM 1 - Stepdown/ICU TEAM PROGRESS NOTE  Justin Sherman ZOX:[id number] DOB: Apr 13, 1940 DOA: 03/30/2015 PCP: Margit Hanks, MD  Admit HPI / Brief Narrative: 74 y.o. male who presented to the ED from his SNF in an altered mental state. Per report patient had acute change in mental status the day of his admit, not responding to verbal commands which he usually does. Patient became completely nonverbal and tachypneic.  No further admit hx was available.    HPI/Subjective: The pt is resting comfortably at the time of my visit.  He is bradycardic into the 40s while asleep, but his BP is stable.  He is in no apparent acute distress.  There is no family present.    Assessment/Plan:  Toxic metabolic encephalopathy  secondary to acute urinary tract infection, uremia, and hyponatremia in the setting of dementia - Chest x-ray on admission was negative  acute on chronic kidney disease stage IV secondary to prerenal azotemia plus or minus ATN - Renal ultrasound with mild bilateral hydronephrosis, bilateral ureteral stents evident, bilateral nephrolithiasis - Nephrology has seen   history of bilateral hydronephrosis status post ureteral stents Nephrology suggests Urology eval to consider removing stents - if pt improves from mental status standpoint, and renal fxn, will consider this - otherwise, w/ primary focus being on comfort there is little to be gained from putting the pt through this at present   urinary tract infection Culture data not helpful - cont empiric abx coverage   hyperkalemia Due to renal failure - resolved   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 Patient typically can follow basic commands - SLP suggests D2 diet w/ thin liquids   hyponatremia secondary to hypovolemic hyponatremia and acute renal failure -  resolved   Normocytic anemia No evidence of blood loss - likely chronic due to CKD w/ acute "drop" reflective of volume expansion   DM A1c 7.5 - CBG variable - follow w/o change in tx plan today   Code Status: DNR Family Communication: no family present at time of exam Disposition Plan: SDU - per PC appears family is moving toward comfort directed care - goal for d/c is to return to SNF w/ comfort directed care   Consultants: Nephrology Palliative Care PCCM  Procedures: none  Antibiotics: Zosyn 12/21 >  DVT prophylaxis: SQ heparin  Objective: Blood pressure 115/57, pulse 51, temperature 97.5 F (36.4 C), temperature source Axillary, resp. rate 14, height 6' (1.829 m), weight 64.6 kg (142 lb 6.7 oz), SpO2 97 %.  Intake/Output Summary (Last 24 hours) at 04/01/15 1406 Last data filed at 04/01/15 1323  Gross per 24 hour  Intake   3840 ml  Output   1825 ml  Net   2015 ml   Exam: General: No acute respiratory distress - resting comfortably  Lungs: Clear to auscultation bilaterally without wheezes or crackles Cardiovascular: brady at 40 - no M or rub  Abdomen: Nontender, nondistended, soft, bowel sounds positive, no rebound, no ascites, no appreciable mass Extremities: No significant cyanosis, clubbing, or edema bilateral lower extremities  Data Reviewed: Basic Metabolic Panel:  Recent Labs Lab 03/30/15 1345 03/30/15 1504 03/30/15 1835 03/31/15 0500 03/31/15 1240 04/01/15 0259  NA 124* 128* 128* 130* 137 134*  K >7.5* QUESTIONABLE RESULTS, RECOMMEND RECOLLECT TO VERIFY >7.5* 6.9* 5.4* 3.6  CL 93* 100* 101 96* 94*  86*  CO2 13* 10* 17* 22 30 36*  GLUCOSE 303* 207* 154* 113* 156* 205*  BUN 153* 147* 136* 129* 117* 101*  CREATININE 6.00* 5.46* 4.94* 4.35* 4.11* 3.61*  CALCIUM 10.6* 11.5* 10.1 10.0 9.8 8.7*  MG 3.2*  --   --   --   --   --   PHOS  --  9.2*  --  6.5*  --  5.3*   CBC:  Recent Labs Lab 03/30/15 1345 03/31/15 0248 04/01/15 0259  WBC 24.7*  17.7* 11.7*  NEUTROABS 23.0*  --  10.0*  HGB 12.1* 10.1* 7.8*  HCT 38.4* 31.3* 24.3*  MCV 83.3 81.9 81.5  PLT 505* 451* 287    Liver Function Tests:  Recent Labs Lab 03/30/15 1345 03/31/15 0500 04/01/15 0259  AST 26  --   --   ALT 16*  --   --   ALKPHOS 99  --   --   BILITOT 0.4  --   --   PROT 9.2*  --   --   ALBUMIN 2.8* 2.1* 1.7*    Recent Labs Lab 03/30/15 1525  AMMONIA 31    Coags:  Recent Labs Lab 03/30/15 1345  INR 1.26    Recent Labs Lab 03/30/15 1345  APTT 35   CBG:  Recent Labs Lab 03/31/15 1215 03/31/15 1754 04/01/15 0122 04/01/15 0556 04/01/15 1228  GLUCAP 159* 157* 213* 185* 293*    Recent Results (from the past 240 hour(s))  Culture, blood (Routine X 2) w Reflex to ID Panel     Status: None (Preliminary result)   Collection Time: 03/30/15  2:00 PM  Result Value Ref Range Status   Specimen Description BLOOD LEFT ANTECUBITAL  Final   Special Requests BOTTLES DRAWN AEROBIC AND ANAEROBIC 5CC  Final   Culture NO GROWTH 2 DAYS  Final   Report Status PENDING  Incomplete  Culture, Urine     Status: None   Collection Time: 03/30/15  2:00 PM  Result Value Ref Range Status   Specimen Description URINE, RANDOM  Final   Special Requests NONE  Final   Culture MULTIPLE SPECIES PRESENT, SUGGEST RECOLLECTION  Final   Report Status 04/01/2015 FINAL  Final  Culture, blood (Routine X 2) w Reflex to ID Panel     Status: None (Preliminary result)   Collection Time: 03/30/15  3:10 PM  Result Value Ref Range Status   Specimen Description BLOOD RIGHT HAND  Final   Special Requests IN PEDIATRIC BOTTLE 4CC  Final   Culture NO GROWTH 2 DAYS  Final   Report Status PENDING  Incomplete  MRSA PCR Screening     Status: None   Collection Time: 03/30/15 10:06 PM  Result Value Ref Range Status   MRSA by PCR NEGATIVE NEGATIVE Final    Comment:        The GeneXpert MRSA Assay (FDA approved for NASAL specimens only), is one component of a comprehensive MRSA  colonization surveillance program. It is not intended to diagnose MRSA infection nor to guide or monitor treatment for MRSA infections.      Studies:   Recent x-ray studies have been reviewed in detail by the Attending Physician  Scheduled Meds:  Scheduled Meds: . antiseptic oral rinse  7 mL Mouth Rinse q12n4p  . chlorhexidine  15 mL Mouth Rinse BID  . heparin  5,000 Units Subcutaneous 3 times per day  . insulin aspart  0-9 Units Subcutaneous Q6H  . piperacillin-tazobactam (ZOSYN)  IV  2.25 g Intravenous 3 times per day  . sodium chloride  3 mL Intravenous Q12H    Time spent on care of this patient: 35 mins   Sayre Mazor T , MD   [facility] Office  [phone] Pager - Text Page per Loretha StaplerAmion as per below:  On-Call/Text Page:      [email or web address]      password [email or web address]  If 7PM-7AM, please contact night-coverage [email or web address] Password [email or web address] 04/01/2015, 2:06 PM   LOS: 2 days

## 2015-04-02 LAB — RENAL FUNCTION PANEL
ALBUMIN: 1.7 g/dL — AB (ref 3.5–5.0)
ANION GAP: 13 (ref 5–15)
BUN: 88 mg/dL — ABNORMAL HIGH (ref 6–20)
CALCIUM: 8.3 mg/dL — AB (ref 8.9–10.3)
CO2: 35 mmol/L — AB (ref 22–32)
CREATININE: 3.64 mg/dL — AB (ref 0.61–1.24)
Chloride: 87 mmol/L — ABNORMAL LOW (ref 101–111)
GFR calc non Af Amer: 15 mL/min — ABNORMAL LOW (ref >60)
GFR, EST AFRICAN AMERICAN: 17 mL/min — AB (ref >60)
GLUCOSE: 218 mg/dL — AB (ref 65–99)
PHOSPHORUS: 3.9 mg/dL (ref 2.5–4.6)
Potassium: 3.3 mmol/L — ABNORMAL LOW (ref 3.5–5.1)
SODIUM: 135 mmol/L (ref 135–145)

## 2015-04-02 LAB — GLUCOSE, CAPILLARY
GLUCOSE-CAPILLARY: 207 mg/dL — AB (ref 65–99)
GLUCOSE-CAPILLARY: 222 mg/dL — AB (ref 65–99)
Glucose-Capillary: 197 mg/dL — ABNORMAL HIGH (ref 65–99)
Glucose-Capillary: 158 mg/dL — ABNORMAL HIGH (ref 65–99)
Glucose-Capillary: 173 mg/dL — ABNORMAL HIGH (ref 65–99)

## 2015-04-02 LAB — CBC
HCT: 25.7 % — ABNORMAL LOW (ref 39.0–52.0)
HEMOGLOBIN: 7.7 g/dL — AB (ref 13.0–17.0)
MCH: 25.3 pg — AB (ref 26.0–34.0)
MCHC: 30 g/dL (ref 30.0–36.0)
MCV: 84.5 fL (ref 78.0–100.0)
PLATELETS: 318 10*3/uL (ref 150–400)
RBC: 3.04 MIL/uL — AB (ref 4.22–5.81)
RDW: 14.5 % (ref 11.5–15.5)
WBC: 11.5 10*3/uL — ABNORMAL HIGH (ref 4.0–10.5)

## 2015-04-02 LAB — IRON AND TIBC
Iron: 52 ug/dL (ref 45–182)
SATURATION RATIOS: 27 % (ref 17.9–39.5)
TIBC: 190 ug/dL — ABNORMAL LOW (ref 250–450)
UIBC: 138 ug/dL

## 2015-04-02 LAB — FERRITIN: FERRITIN: 272 ng/mL (ref 24–336)

## 2015-04-02 MED ORDER — PIPERACILLIN-TAZOBACTAM 3.375 G IVPB
3.3750 g | Freq: Three times a day (TID) | INTRAVENOUS | Status: DC
  Filled 2015-04-02 (×2): qty 50

## 2015-04-02 MED ORDER — PIPERACILLIN-TAZOBACTAM 3.375 G IVPB 30 MIN
3.3750 g | Freq: Three times a day (TID) | INTRAVENOUS | Status: DC
Start: 2015-04-02 — End: 2015-04-03
  Administered 2015-04-02 – 2015-04-03 (×3): 3.375 g via INTRAVENOUS
  Filled 2015-04-02 (×7): qty 50

## 2015-04-02 NOTE — Progress Notes (Signed)
[place] TEAM 1 - Stepdown/ICU TEAM PROGRESS NOTE  Justin Sherman WUJ:[id number] DOB: 06-08-1940 DOA: 03/30/2015 PCP: Margit Hanks, MD  Admit HPI / Brief Narrative: 74 y.o. male who presented to the ED from his SNF in an altered mental state. Per report patient had acute change in mental status the day of his admit, not responding to verbal commands which he usually does. Patient became completely nonverbal and tachypneic.  No further admit hx was available.    HPI/Subjective: The pt is alert and interactive, but he is not able to provide a reliable hx.  He answers "five" to all questions.  He does not appear to be uncomfortable or in acute distress.    Assessment/Plan:  Toxic metabolic encephalopathy  secondary to acute urinary tract infection, uremia, and hyponatremia in the setting of dementia - Chest x-ray on admission was negative - more alert today, but remains quite confused - I am not sure this will improve further, unless his uremia improves   acute on chronic kidney disease stage IV secondary to prerenal azotemia plus or minus ATN - baseline crt appears to be 2.0-2.4 - Renal ultrasound with mild bilateral hydronephrosis, bilateral ureteral stents evident, bilateral nephrolithiasis - Nephrology has seen - renal fxn has not improved further over the last 24hrs - cont to follow trend w/ gentle hydration   history of bilateral hydronephrosis status post ureteral stents Nephrology suggests Urology eval to consider removing stents - if pt improves from mental status standpoint, and renal fxn, will consider this - otherwise, w/ primary focus being on comfort there is little to be gained from putting the pt through this at present   urinary tract infection Culture data not helpful - cont empiric abx coverage for now   hyperkalemia Due to renal failure - resolved   dehydration Clinically resolved w/ volume expansion   new left bundle branch block / sinus tachycardia No further  w/u to be pursued   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 Patient typically can follow basic commands - SLP suggests D2 diet w/ thin liquids   hyponatremia secondary to hypovolemic hyponatremia and acute renal failure - resolved   Normocytic anemia No evidence of blood loss - likely chronic due to CKD w/ acute "drop" reflective of volume expansion - Hgb appears to have stabilized at ~7.7   DM A1c 7.5 - CBG variable - follow w/o change in tx plan today   Code Status: DNR Family Communication: no family present at time of exam Disposition Plan: stable for transfer to medical bed - plan is to continue conservative med tx to include IVF and abx for now and watch for improvement - if he declines further, will transition to full comfort care at that time - no further aggressive w/u or interventions to be considered   Consultants: Nephrology Palliative Care PCCM  Procedures: none  Antibiotics: Zosyn 12/21 >  DVT prophylaxis: SQ heparin  Objective: Blood pressure 120/48, pulse 47, temperature 98.1 F (36.7 C), temperature source Oral, resp. rate 16, height 6' (1.829 m), weight 64.6 kg (142 lb 6.7 oz), SpO2 96 %.  Intake/Output Summary (Last 24 hours) at 04/02/15 1026 Last data filed at 04/02/15 0700  Gross per 24 hour  Intake 1988.33 ml  Output   1775 ml  Net 213.33 ml   Exam: General: No acute respiratory distress - alert but confused  Lungs: Clear to auscultation bilaterally without wheezes or crackles Cardiovascular: brady at 40 -50 - no M or  rub  Abdomen: Nontender, nondistended, soft, bowel sounds positive, no rebound, no ascites, no appreciable mass Extremities: No significant cyanosis, clubbing, edema bilateral lower extremities  Data Reviewed: Basic Metabolic Panel:  Recent Labs Lab 03/30/15 1345 03/30/15 1504 03/30/15 1835 03/31/15 0500 03/31/15 1240 04/01/15 0259 04/02/15 0631  NA 124* 128* 128* 130* 137 134* 135  K >7.5* QUESTIONABLE  RESULTS, RECOMMEND RECOLLECT TO VERIFY >7.5* 6.9* 5.4* 3.6 3.3*  CL 93* 100* 101 96* 94* 86* 87*  CO2 13* 10* 17* 22 30 36* 35*  GLUCOSE 303* 207* 154* 113* 156* 205* 218*  BUN 153* 147* 136* 129* 117* 101* 88*  CREATININE 6.00* 5.46* 4.94* 4.35* 4.11* 3.61* 3.64*  CALCIUM 10.6* 11.5* 10.1 10.0 9.8 8.7* 8.3*  MG 3.2*  --   --   --   --   --   --   PHOS  --  9.2*  --  6.5*  --  5.3* 3.9   CBC:  Recent Labs Lab 03/30/15 1345 03/31/15 0248 04/01/15 0259 04/02/15 0631  WBC 24.7* 17.7* 11.7* 11.5*  NEUTROABS 23.0*  --  10.0*  --   HGB 12.1* 10.1* 7.8* 7.7*  HCT 38.4* 31.3* 24.3* 25.7*  MCV 83.3 81.9 81.5 84.5  PLT 505* 451* 287 318    Liver Function Tests:  Recent Labs Lab 03/30/15 1345 03/31/15 0500 04/01/15 0259 04/02/15 0631  AST 26  --   --   --   ALT 16*  --   --   --   ALKPHOS 99  --   --   --   BILITOT 0.4  --   --   --   PROT 9.2*  --   --   --   ALBUMIN 2.8* 2.1* 1.7* 1.7*    Recent Labs Lab 03/30/15 1525  AMMONIA 31    Coags:  Recent Labs Lab 03/30/15 1345  INR 1.26    Recent Labs Lab 03/30/15 1345  APTT 35   CBG:  Recent Labs Lab 04/01/15 0556 04/01/15 1228 04/01/15 1726 04/01/15 2336 04/02/15 0746  GLUCAP 185* 293* 221* 197* 207*    Recent Results (from the past 240 hour(s))  Culture, blood (Routine X 2) w Reflex to ID Panel     Status: None (Preliminary result)   Collection Time: 03/30/15  2:00 PM  Result Value Ref Range Status   Specimen Description BLOOD LEFT ANTECUBITAL  Final   Special Requests BOTTLES DRAWN AEROBIC AND ANAEROBIC 5CC  Final   Culture NO GROWTH 2 DAYS  Final   Report Status PENDING  Incomplete  Culture, Urine     Status: None   Collection Time: 03/30/15  2:00 PM  Result Value Ref Range Status   Specimen Description URINE, RANDOM  Final   Special Requests NONE  Final   Culture MULTIPLE SPECIES PRESENT, SUGGEST RECOLLECTION  Final   Report Status 04/01/2015 FINAL  Final  Culture, blood (Routine X 2) w  Reflex to ID Panel     Status: None (Preliminary result)   Collection Time: 03/30/15  3:10 PM  Result Value Ref Range Status   Specimen Description BLOOD RIGHT HAND  Final   Special Requests IN PEDIATRIC BOTTLE 4CC  Final   Culture NO GROWTH 2 DAYS  Final   Report Status PENDING  Incomplete  MRSA PCR Screening     Status: None   Collection Time: 03/30/15 10:06 PM  Result Value Ref Range Status   MRSA by PCR NEGATIVE NEGATIVE Final  Comment:        The GeneXpert MRSA Assay (FDA approved for NASAL specimens only), is one component of a comprehensive MRSA colonization surveillance program. It is not intended to diagnose MRSA infection nor to guide or monitor treatment for MRSA infections.      Studies:   Recent x-ray studies have been reviewed in detail by the Attending Physician  Scheduled Meds:  Scheduled Meds: . antiseptic oral rinse  7 mL Mouth Rinse q12n4p  . chlorhexidine  15 mL Mouth Rinse BID  . heparin  5,000 Units Subcutaneous 3 times per day  . insulin aspart  0-9 Units Subcutaneous TID WC  . piperacillin-tazobactam  3.375 g Intravenous 3 times per day  . sodium chloride  3 mL Intravenous Q12H    Time spent on care of this patient: 35 mins   MCCLUNG,JEFFREY T , MD   [facility] Office  [phone] Pager - Text Page per Loretha StaplerAmion as per below:  On-Call/Text Page:      [email or web address]      password [email or web address]  If 7PM-7AM, please contact night-coverage [email or web address] Password [email or web address] 04/02/2015, 10:26 AM   LOS: 3 days

## 2015-04-02 NOTE — Progress Notes (Signed)
Patient arrived from 3 S around 1200 no obvious pain issues, he is alert but cognitively impaired responding in word salad and repetitive remarks. He is resting comfortably will continue to monitor.

## 2015-04-02 NOTE — Progress Notes (Signed)
Patient to transfer to 10M report given to receiving nurse, all questions answered at this time.  Pt. VSS with no s/s of distress noted.  Pt. Stable at transfer.

## 2015-04-02 NOTE — Progress Notes (Signed)
Subjective:  Is more even overnight- HR lowish- nearly 2 liters of urine- BUN down, creatinine stable , k down Objective Vital signs in last 24 hours: Filed Vitals:   04/01/15 1937 04/01/15 2333 04/02/15 0353 04/02/15 0700  BP: 116/51 122/46 120/48   Pulse: 47 49 47   Temp: 97.8 F (36.6 C) 98 F (36.7 C) 97.9 F (36.6 C) 98.1 F (36.7 C)  TempSrc: Axillary Oral Oral Oral  Resp: Height:      Weight:      SpO2: 100% 100% 96%    Weight change:   Intake/Output Summary (Last 24 hours) at 04/02/15 0841 Last data filed at 04/02/15 0700  Gross per 24 hour  Intake 2228.33 ml  Output   1775 ml  Net 453.33 ml    Assessment/ Plan: Pt is a 74 y.o. yo male NH pt with CVA who was admitted on 03/30/2015 with A on CRF with hyperkalemia in setting of volume depletion as well as possible obstruction  Assessment/Plan: 1. A on CRF- improving with hydration alone so far.  However, urine with pyuria and history of stents possibly in long term with bilateral hydro on imaging-  Has been determined to not be a dialysis candidate due to baseline poor health and function.  The question of dealing with stents has been brought up and decision to be made based on pts global improvement 2. ID- pyuria- will be difficult to clear with stents in place- on zosyn.   3. HTN/vol- I think now that tank is full and no longer acidotic  IVF to NS at 50 per hour- will third space due to hypoalbuminemia 4. Anemia- has been brought out with hydration- iron stores low- consider iron repletion vs transfusion although if going to complete comfort would not need 5. Hyperkalemia- resolved- now hypo- replete as you feel needed 6. Bradycardia- unknown etiology- per primary 7. Dispo- DNR and not to do dialysis - palliative care consult in place  - this seems to be the direction of care right now- renal will sign off- call if any further questions  Keyshaun Exley A    Labs: Basic Metabolic Panel:  Recent  Labs Lab 03/31/15 0500 03/31/15 1240 04/01/15 0259 04/02/15 0631  NA 130* 137 134* 135  K 6.9* 5.4* 3.6 3.3*  CL 96* 94* 86* 87*  CO2 22 30 36* 35*  GLUCOSE 113* 156* 205* 218*  BUN 129* 117* 101* 88*  CREATININE 4.35* 4.11* 3.61* 3.64*  CALCIUM 10.0 9.8 8.7* 8.3*  PHOS 6.5*  --  5.3* 3.9   Liver Function Tests:  Recent Labs Lab 03/30/15 1345 03/31/15 0500 04/01/15 0259 04/02/15 0631  AST 26  --   --   --   ALT 16*  --   --   --   ALKPHOS 99  --   --   --   BILITOT 0.4  --   --   --   PROT 9.2*  --   --   --   ALBUMIN 2.8* 2.1* 1.7* 1.7*   No results for input(s): LIPASE, AMYLASE in the last 168 hours.  Recent Labs Lab 03/30/15 1525  AMMONIA 31   CBC:  Recent Labs Lab 03/30/15 1345 03/31/15 0248 04/01/15 0259 04/02/15 0631  WBC 24.7* 17.7* 11.7* 11.5*  NEUTROABS 23.0*  --  10.0*  --   HGB 12.1* 10.1* 7.8* 7.7*  HCT 38.4* 31.3* 24.3* 25.7*  MCV 83.3 81.9 81.5 84.5  PLT 505* 451* 287  318   Cardiac Enzymes: No results for input(s): CKTOTAL, CKMB, CKMBINDEX, TROPONINI in the last 168 hours. CBG:  Recent Labs Lab 04/01/15 0122 04/01/15 0556 04/01/15 1228 04/01/15 1726 04/01/15 2336  GLUCAP 213* 185* 293* 221* 197*    Iron Studies: No results for input(s): IRON, TIBC, TRANSFERRIN, FERRITIN in the last 72 hours. Studies/Results: No results found. Medications: Infusions: . sodium chloride 50 mL/hr at 04/01/15 [id number]    Scheduled Medications: . antiseptic oral rinse  7 mL Mouth Rinse q12n4p  . chlorhexidine  15 mL Mouth Rinse BID  . heparin  5,000 Units Subcutaneous 3 times per day  . insulin aspart  0-9 Units Subcutaneous TID WC  . piperacillin-tazobactam (ZOSYN)  IV  2.25 g Intravenous 3 times per day  . sodium chloride  3 mL Intravenous Q12H    have reviewed scheduled and prn medications.  Physical Exam: General: eyes open- nods- non verbal except HI Heart: bradycardic Lungs: mostly clear Abdomen: soft, non tender Extremities: minimal  edema    04/02/2015,8:41 AM  LOS: 3 days

## 2015-04-02 NOTE — Progress Notes (Addendum)
Pharmacy Antibiotic Follow-up Note  Justin Sherman is a 74 y.o. year-old male admitted on 03/30/2015. The patient is currently on day 4 of Zosyn for UTI (POA) in setting of uretal stent placement.  Assessment/Plan: 1. Change to Zosyn 3.375g gram IV q8h infused over 30 minutes due to improvement in renal function 2. Await px micro data and deescalate antibiotics as feasible   Temp (24hrs), Avg:97.8 F (36.6 C), Min:97.5 F (36.4 C), Max:98.1 F (36.7 C)   Recent Labs Lab 03/30/15 1345 03/31/15 0248 04/01/15 0259 04/02/15 0631  WBC 24.7* 17.7* 11.7* 11.5*     Recent Labs Lab 03/30/15 1835 03/31/15 0500 03/31/15 1240 04/01/15 0259 04/02/15 0631  CREATININE 4.94* 4.35* 4.11* 3.61* 3.64*  *CKD 4 at baseline*  No Known Allergies  Antimicrobials this admission: 12/21 Zosyn >>   Levels/dose changes this admission: None to date   Microbiology results: 12/21 BCx: ngtd 12/21 UCx: "multiple species present suggest recollect "    Thank you for allowing pharmacy to be a part of this patient's care.  Sandi CarneNick Alizzon Dioguardi, PharmD Pharmacy Resident Pager: [phone]

## 2015-04-03 ENCOUNTER — Inpatient Hospital Stay (HOSPITAL_COMMUNITY): Payer: Medicare Other

## 2015-04-03 LAB — BASIC METABOLIC PANEL
ANION GAP: 11 (ref 5–15)
BUN: 71 mg/dL — ABNORMAL HIGH (ref 6–20)
CALCIUM: 8.3 mg/dL — AB (ref 8.9–10.3)
CO2: 33 mmol/L — AB (ref 22–32)
Chloride: 93 mmol/L — ABNORMAL LOW (ref 101–111)
Creatinine, Ser: 3.15 mg/dL — ABNORMAL HIGH (ref 0.61–1.24)
GFR, EST AFRICAN AMERICAN: 21 mL/min — AB (ref >60)
GFR, EST NON AFRICAN AMERICAN: 18 mL/min — AB (ref >60)
Glucose, Bld: 126 mg/dL — ABNORMAL HIGH (ref 65–99)
Potassium: 3.2 mmol/L — ABNORMAL LOW (ref 3.5–5.1)
Sodium: 137 mmol/L (ref 135–145)

## 2015-04-03 LAB — GLUCOSE, CAPILLARY
GLUCOSE-CAPILLARY: 108 mg/dL — AB (ref 65–99)
Glucose-Capillary: 88 mg/dL (ref 65–99)
Glucose-Capillary: 89 mg/dL (ref 65–99)
Glucose-Capillary: 112 mg/dL — ABNORMAL HIGH (ref 65–99)
Glucose-Capillary: 95 mg/dL (ref 65–99)

## 2015-04-03 MED ORDER — ACETAMINOPHEN 325 MG PO TABS
650.0000 mg | ORAL_TABLET | Freq: Four times a day (QID) | ORAL | Status: DC | PRN
  Administered 2015-04-03: 650 mg via ORAL
  Filled 2015-04-03: qty 2

## 2015-04-03 MED ORDER — POTASSIUM CHLORIDE CRYS ER 20 MEQ PO TBCR
40.0000 meq | EXTENDED_RELEASE_TABLET | Freq: Once | ORAL | Status: AC
  Administered 2015-04-03: 40 meq via ORAL
  Filled 2015-04-03: qty 2

## 2015-04-03 MED ORDER — PIPERACILLIN-TAZOBACTAM IN DEX 2-0.25 GM/50ML IV SOLN
2.2500 g | Freq: Three times a day (TID) | INTRAVENOUS | Status: DC
  Administered 2015-04-03 – 2015-04-05 (×6): 2.25 g via INTRAVENOUS
  Filled 2015-04-03 (×10): qty 50

## 2015-04-03 NOTE — Progress Notes (Signed)
PROGRESS NOTE  Justin Sherman ZOX:[id number] DOB: 06-Jan-1941 DOA: 03/30/2015 PCP: Margit Hanks, MD  Admit HPI / Brief Narrative: 74 y.o. male who presented to the ED from his SNF in an altered mental state. Per report patient had acute change in mental status the day of his admit, not responding to verbal commands which he usually does. Patient became completely nonverbal and tachypneic.  No further admit hx was available.    HPI/Subjective: The pt is alert and interactive, confused, and provide reliable history or complaints. Assessment/Plan:  Toxic metabolic encephalopathy  secondary to acute urinary tract infection, uremia, and hyponatremia in the setting of dementia - Chest x-ray on admission was negative  -Continues to improve, but remains quite confused , will obtain CT head without contrast.  acute on chronic kidney disease stage IV secondary to prerenal azotemia plus or minus ATN - baseline crt appears to be 2.0-2.4 - Renal ultrasound with mild bilateral hydronephrosis, bilateral ureteral stents evident, bilateral nephrolithiasis - Nephrology has seen - renal fxn continues to improve w/ gentle hydration   history of bilateral hydronephrosis status post ureteral stents Nephrology suggests Urology eval to consider removing stents - if pt improves from mental status standpoint, and renal fxn, will consider this - otherwise, w/ primary focus being on comfort there is little to be gained from putting the pt through this at present   urinary tract infection Culture data not helpful - cont empiric abx coverage for now   hyperkalemia Due to renal failure - resolved  With hypokalemia today will replete.  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 Patient  typically can follow basic commands - SLP suggests D2 diet w/ thin liquids   hyponatremia secondary to hypovolemic hyponatremia and acute renal failure - resolved   Normocytic anemia No evidence of blood loss - likely chronic due to CKD w/ acute "drop" reflective of volume expansion - Hgb appears to have stabilized at ~7.7   DM A1c 7.5 - CBG variable - follow w/o change in tx plan today   Code Status: DNR Family Communication: None at bedside Disposition Plan: Depends on clinical course - plan is to continue conservative med tx to include IVF and abx for now and watch for improvement - if he declines further, will transition to full comfort care at that time - no further aggressive w/u or interventions to be considered   Consultants: Nephrology Palliative Care PCCM  Procedures: none  Antibiotics: Zosyn 12/21 >  DVT prophylaxis: SQ heparin  Objective: Blood pressure 132/62, pulse 72, temperature 98.1 F (36.7 C), temperature source Oral, resp. rate 20, height 6' (1.829 m), weight 64.6 kg (142 lb 6.7 oz), SpO2 100 %.  Intake/Output Summary (Last 24 hours) at 04/03/15 1127 Last data filed at 04/03/15 0805  Gross per 24 hour  Intake    360 ml  Output    700 ml  Net   -  340 ml   Exam: General: No acute respiratory distress - alert but confused  Lungs: Clear to auscultation bilaterally without wheezes or crackles Cardiovascular: One is 2+, no rubs or murmurs Abdomen: Nontender, nondistended, soft, bowel sounds positive, no rebound, no ascites, no appreciable mass Extremities: No significant cyanosis, clubbing, edema bilateral lower extremities  Data Reviewed: Basic Metabolic Panel:  Recent Labs Lab 03/30/15 1345 03/30/15 1504  03/31/15 0500 03/31/15 1240 04/01/15 0259 04/02/15 0631 04/03/15 0219  NA 124* 128*  < > 130* 137 134* 135 137  K >7.5* QUESTIONABLE RESULTS, RECOMMEND RECOLLECT TO VERIFY  < > 6.9* 5.4* 3.6 3.3* 3.2*  CL 93* 100*  < > 96* 94* 86* 87* 93*    CO2 13* 10*  < > 22 30 36* 35* 33*  GLUCOSE 303* 207*  < > 113* 156* 205* 218* 126*  BUN 153* 147*  < > 129* 117* 101* 88* 71*  CREATININE 6.00* 5.46*  < > 4.35* 4.11* 3.61* 3.64* 3.15*  CALCIUM 10.6* 11.5*  < > 10.0 9.8 8.7* 8.3* 8.3*  MG 3.2*  --   --   --   --   --   --   --   PHOS  --  9.2*  --  6.5*  --  5.3* 3.9  --   < > = values in this interval not displayed. CBC:  Recent Labs Lab 03/30/15 1345 03/31/15 0248 04/01/15 0259 04/02/15 0631  WBC 24.7* 17.7* 11.7* 11.5*  NEUTROABS 23.0*  --  10.0*  --   HGB 12.1* 10.1* 7.8* 7.7*  HCT 38.4* 31.3* 24.3* 25.7*  MCV 83.3 81.9 81.5 84.5  PLT 505* 451* 287 318    Liver Function Tests:  Recent Labs Lab 03/30/15 1345 03/31/15 0500 04/01/15 0259 04/02/15 0631  AST 26  --   --   --   ALT 16*  --   --   --   ALKPHOS 99  --   --   --   BILITOT 0.4  --   --   --   PROT 9.2*  --   --   --   ALBUMIN 2.8* 2.1* 1.7* 1.7*    Recent Labs Lab 03/30/15 1525  AMMONIA 31    Coags:  Recent Labs Lab 03/30/15 1345  INR 1.26    Recent Labs Lab 03/30/15 1345  APTT 35   CBG:  Recent Labs Lab 04/02/15 0746 04/02/15 1141 04/02/15 1646 04/02/15 2129 04/03/15 0643  GLUCAP 207* 222* 158* 173* 88    Recent Results (from the past 240 hour(s))  Culture, blood (Routine X 2) w Reflex to ID Panel     Status: None (Preliminary result)   Collection Time: 03/30/15  2:00 PM  Result Value Ref Range Status   Specimen Description BLOOD LEFT ANTECUBITAL  Final   Special Requests BOTTLES DRAWN AEROBIC AND ANAEROBIC 5CC  Final   Culture NO GROWTH 3 DAYS  Final   Report Status PENDING  Incomplete  Culture, Urine     Status: None   Collection Time: 03/30/15  2:00 PM  Result Value Ref Range Status   Specimen Description URINE, RANDOM  Final   Special Requests NONE  Final   Culture MULTIPLE SPECIES PRESENT, SUGGEST RECOLLECTION  Final   Report Status 04/01/2015 FINAL  Final  Culture, blood (Routine X 2) w Reflex to ID Panel      Status: None (Preliminary result)   Collection Time: 03/30/15  3:10 PM  Result Value Ref Range  Status   Specimen Description BLOOD RIGHT HAND  Final   Special Requests IN PEDIATRIC BOTTLE 4CC  Final   Culture NO GROWTH 3 DAYS  Final   Report Status PENDING  Incomplete  MRSA PCR Screening     Status: None   Collection Time: 03/30/15 10:06 PM  Result Value Ref Range Status   MRSA by PCR NEGATIVE NEGATIVE Final    Comment:        The GeneXpert MRSA Assay (FDA approved for NASAL specimens only), is one component of a comprehensive MRSA colonization surveillance program. It is not intended to diagnose MRSA infection nor to guide or monitor treatment for MRSA infections.      Studies:   Recent x-ray studies have been reviewed in detail by the Attending Physician  Scheduled Meds:  Scheduled Meds: . antiseptic oral rinse  7 mL Mouth Rinse q12n4p  . chlorhexidine  15 mL Mouth Rinse BID  . heparin  5,000 Units Subcutaneous 3 times per day  . insulin aspart  0-9 Units Subcutaneous TID WC  . piperacillin-tazobactam (ZOSYN)  IV  2.25 g Intravenous 3 times per day  . sodium chloride  3 mL Intravenous Q12H    Time spent on care of this patient: 30 mins   Rosealie Reach , MD  PAGER [phone]  [facility] Office  [phone] Pager - Text Page per Amion as per below:  On-Call/Text Page:      [email or web address]      password [email or web address]  If 7PM-7AM, please contact night-coverage [email or web address] Password [email or web address] 04/03/2015, 11:27 AM   LOS: 4 days

## 2015-04-04 LAB — BASIC METABOLIC PANEL
ANION GAP: 6 (ref 5–15)
BUN: 45 mg/dL — ABNORMAL HIGH (ref 6–20)
CALCIUM: 8.5 mg/dL — AB (ref 8.9–10.3)
CO2: 32 mmol/L (ref 22–32)
Chloride: 100 mmol/L — ABNORMAL LOW (ref 101–111)
Creatinine, Ser: 2.72 mg/dL — ABNORMAL HIGH (ref 0.61–1.24)
GFR, EST AFRICAN AMERICAN: 25 mL/min — AB (ref >60)
GFR, EST NON AFRICAN AMERICAN: 21 mL/min — AB (ref >60)
GLUCOSE: 99 mg/dL (ref 65–99)
POTASSIUM: 3.4 mmol/L — AB (ref 3.5–5.1)
Sodium: 138 mmol/L (ref 135–145)

## 2015-04-04 LAB — CULTURE, BLOOD (ROUTINE X 2)
CULTURE: NO GROWTH
Culture: NO GROWTH

## 2015-04-04 LAB — CBC
HEMATOCRIT: 29.7 % — AB (ref 39.0–52.0)
HEMOGLOBIN: 8.7 g/dL — AB (ref 13.0–17.0)
MCH: 25.5 pg — ABNORMAL LOW (ref 26.0–34.0)
MCHC: 29.3 g/dL — AB (ref 30.0–36.0)
MCV: 87.1 fL (ref 78.0–100.0)
Platelets: 262 10*3/uL (ref 150–400)
RBC: 3.41 MIL/uL — AB (ref 4.22–5.81)
RDW: 14.7 % (ref 11.5–15.5)
WBC: 14 10*3/uL — ABNORMAL HIGH (ref 4.0–10.5)

## 2015-04-04 LAB — GLUCOSE, CAPILLARY
Glucose-Capillary: 85 mg/dL (ref 65–99)
Glucose-Capillary: 123 mg/dL — ABNORMAL HIGH (ref 65–99)
Glucose-Capillary: 105 mg/dL — ABNORMAL HIGH (ref 65–99)
Glucose-Capillary: 111 mg/dL — ABNORMAL HIGH (ref 65–99)

## 2015-04-04 MED ORDER — POTASSIUM CHLORIDE CRYS ER 20 MEQ PO TBCR
30.0000 meq | EXTENDED_RELEASE_TABLET | ORAL | Status: AC
  Administered 2015-04-04 (×2): 30 meq via ORAL
  Filled 2015-04-04 (×2): qty 1

## 2015-04-04 NOTE — Progress Notes (Signed)
PROGRESS NOTE  Justin Sherman JYN:[id number] DOB: December 23, 1940 DOA: 03/30/2015 PCP: Margit Hanks, MD  Admit HPI / Brief Narrative: 74 y.o. male who presented to the ED from his SNF in an altered mental state. Per report patient had acute change in mental status the day of his admit, not responding to verbal commands which he usually does. Patient became completely nonverbal and tachypneic, patient acute encephalopathy felt most likely due to worsening renal function and uremia, infection secondary to UTI, seen by nephrology, renal function continues to improve on gentle hydration, sepsis has resolved as well, as is been treated on Zosyn for UTI, family do not want aggressive measures, he went to continue with medical management only.  HPI/Subjective: The pt is alert and interactive, confused, denies any chest pain, shortness of breath, headache. Assessment/Plan:  Toxic metabolic encephalopathy  - secondary to acute urinary tract infection, uremia, and hyponatremia in the setting of dementia, dehydration and infection - Continues to improve, but remains quite confused . - CT head with no acute finding  acute on chronic kidney disease stage IV - secondary to prerenal azotemia plus or minus ATN - baseline crt appears to be 2.0-2.4 - Renal ultrasound with mild bilateral hydronephrosis, bilateral ureteral stents evident, bilateral nephrolithiasis - Nephrology has seen - renal fxn continues to improve w/ gentle hydration   history of bilateral hydronephrosis status post ureteral stents - Nephrology suggests Urology eval to consider removing stents, she continues to improve, so for his pyuria to clear his mental status changed, plan  was discussed with Dr. Vernie Ammons, they will arrange for stent change as an outpatient, and to continue covering with antibiotics, unfortunately urine culture growing multiple species, so we'll  change Zosyn to Augmentin on discharge to continue total of 14 days, whereby then he'll has his stents exchanged.  urinary tract infection Culture data not helpful - cont with IV Zosyn coverage for now especially in the presence of stents.  Hyperkalemia Due to renal failure - resolved   Hypokalemia - Repleted, recheck in a.m.  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  DM A1c 7.5 - CBG variable - follow w/o change in tx plan today   Code Status: DNR Family Communication: D/W with legal guardian Disposition Plan: Depends on clinical course, SNF placement in 24 hours if continues to improve.  Consultants: Nephrology Palliative Care PCCM Urology via phone  Procedures: none  Antibiotics: Zosyn 12/21 >  DVT prophylaxis: SQ heparin  Objective: Blood pressure 155/50, pulse 89, temperature 98.4 F (36.9 C), temperature source Oral, resp. rate 16, height 6' (  1.829 m), weight 64.6 kg (142 lb 6.7 oz), SpO2 97 %.  Intake/Output Summary (Last 24 hours) at 04/04/15 1020 Last data filed at 04/04/15 0701  Gross per 24 hour  Intake    120 ml  Output      0 ml  Net    120 ml   Exam: General: No acute respiratory distress - alert, negative but confused  Lungs: Clear to auscultation bilaterally without wheezes or crackles Cardiovascular: One is 2+, no rubs or murmurs Abdomen: Nontender, nondistended, soft, bowel sounds positive, no rebound, no ascites, no appreciable mass Extremities: No significant  cyanosis, clubbing, edema bilateral lower extremities  Data Reviewed: Basic Metabolic Panel:  Recent Labs Lab 03/30/15 1345 03/30/15 1504  03/31/15 0500 03/31/15 1240 04/01/15 0259 04/02/15 0631 04/03/15 0219 04/04/15 0605  NA 124* 128*  < > 130* 137 134* 135 137 138  K >7.5* QUESTIONABLE RESULTS, RECOMMEND RECOLLECT TO VERIFY  < > 6.9* 5.4* 3.6 3.3* 3.2* 3.4*  CL 93* 100*  < > 96* 94* 86* 87* 93* 100*  CO2 13* 10*  < > 22 30 36* 35* 33* 32  GLUCOSE 303* 207*  < > 113* 156* 205* 218* 126* 99  BUN 153* 147*  < > 129* 117* 101* 88* 71* 45*  CREATININE 6.00* 5.46*  < > 4.35* 4.11* 3.61* 3.64* 3.15* 2.72*  CALCIUM 10.6* 11.5*  < > 10.0 9.8 8.7* 8.3* 8.3* 8.5*  MG 3.2*  --   --   --   --   --   --   --   --   PHOS  --  9.2*  --  6.5*  --  5.3* 3.9  --   --   < > = values in this interval not displayed. CBC:  Recent Labs Lab 03/30/15 1345 03/31/15 0248 04/01/15 0259 04/02/15 0631 04/04/15 0605  WBC 24.7* 17.7* 11.7* 11.5* 14.0*  NEUTROABS 23.0*  --  10.0*  --   --   HGB 12.1* 10.1* 7.8* 7.7* 8.7*  HCT 38.4* 31.3* 24.3* 25.7* 29.7*  MCV 83.3 81.9 81.5 84.5 87.1  PLT 505* 451* 287 318 262    Liver Function Tests:  Recent Labs Lab 03/30/15 1345 03/31/15 0500 04/01/15 0259 04/02/15 0631  AST 26  --   --   --   ALT 16*  --   --   --   ALKPHOS 99  --   --   --   BILITOT 0.4  --   --   --   PROT 9.2*  --   --   --   ALBUMIN 2.8* 2.1* 1.7* 1.7*    Recent Labs Lab 03/30/15 1525  AMMONIA 31    Coags:  Recent Labs Lab 03/30/15 1345  INR 1.26    Recent Labs Lab 03/30/15 1345  APTT 35   CBG:  Recent Labs Lab 04/03/15 1129 04/03/15 1645 04/03/15 1706 04/03/15 2134 04/04/15 0630  GLUCAP 89 95 112* 108* 85    Recent Results (from the past 240 hour(s))  Culture, blood (Routine X 2) w Reflex to ID Panel     Status: None   Collection Time: 03/30/15  2:00 PM  Result Value Ref Range Status   Specimen Description BLOOD LEFT ANTECUBITAL  Final    Special Requests BOTTLES DRAWN AEROBIC AND ANAEROBIC 5CC  Final   Culture NO GROWTH 5 DAYS  Final   Report Status 04/04/2015 FINAL  Final  Culture, Urine     Status: None  Collection Time: 03/30/15  2:00 PM  Result Value Ref Range Status   Specimen Description URINE, RANDOM  Final   Special Requests NONE  Final   Culture MULTIPLE SPECIES PRESENT, SUGGEST RECOLLECTION  Final   Report Status 04/01/2015 FINAL  Final  Culture, blood (Routine X 2) w Reflex to ID Panel     Status: None   Collection Time: 03/30/15  3:10 PM  Result Value Ref Range Status   Specimen Description BLOOD RIGHT HAND  Final   Special Requests IN PEDIATRIC BOTTLE 4CC  Final   Culture NO GROWTH 5 DAYS  Final   Report Status 04/04/2015 FINAL  Final  MRSA PCR Screening     Status: None   Collection Time: 03/30/15 10:06 PM  Result Value Ref Range Status   MRSA by PCR NEGATIVE NEGATIVE Final    Comment:        The GeneXpert MRSA Assay (FDA approved for NASAL specimens only), is one component of a comprehensive MRSA colonization surveillance program. It is not intended to diagnose MRSA infection nor to guide or monitor treatment for MRSA infections.        Scheduled Meds:  Scheduled Meds: . antiseptic oral rinse  7 mL Mouth Rinse q12n4p  . chlorhexidine  15 mL Mouth Rinse BID  . heparin  5,000 Units Subcutaneous 3 times per day  . insulin aspart  0-9 Units Subcutaneous TID WC  . piperacillin-tazobactam (ZOSYN)  IV  2.25 g Intravenous 3 times per day  . sodium chloride  3 mL Intravenous Q12H    Time spent on care of this patient: 25 minutes   Tyannah Sane , MD  PAGER [phone] If 7PM-7AM, please contact night-coverage [email or web address] Password [email or web address] 04/04/2015, 10:20 AM   LOS: 5 days

## 2015-04-05 DIAGNOSIS — Z7189 Other specified counseling: Secondary | ICD-10-CM

## 2015-04-05 LAB — GLUCOSE, CAPILLARY
GLUCOSE-CAPILLARY: 108 mg/dL — AB (ref 65–99)
Glucose-Capillary: 80 mg/dL (ref 65–99)
Glucose-Capillary: 116 mg/dL — ABNORMAL HIGH (ref 65–99)

## 2015-04-05 LAB — BASIC METABOLIC PANEL
ANION GAP: 7 (ref 5–15)
BUN: 31 mg/dL — ABNORMAL HIGH (ref 6–20)
CALCIUM: 8.8 mg/dL — AB (ref 8.9–10.3)
CHLORIDE: 104 mmol/L (ref 101–111)
CO2: 26 mmol/L (ref 22–32)
Creatinine, Ser: 2.36 mg/dL — ABNORMAL HIGH (ref 0.61–1.24)
GFR calc non Af Amer: 25 mL/min — ABNORMAL LOW (ref >60)
GFR, EST AFRICAN AMERICAN: 29 mL/min — AB (ref >60)
GLUCOSE: 103 mg/dL — AB (ref 65–99)
POTASSIUM: 3.8 mmol/L (ref 3.5–5.1)
Sodium: 137 mmol/L (ref 135–145)

## 2015-04-05 MED ORDER — POLYETHYLENE GLYCOL 3350 17 G PO PACK: 17.0000 g | PACK | Freq: Every day | ORAL | Status: AC | PRN

## 2015-04-05 MED ORDER — INSULIN ASPART 100 UNIT/ML ~~LOC~~ SOLN: 0.0000 [IU] | Freq: Three times a day (TID) | SUBCUTANEOUS | Status: DC

## 2015-04-05 MED ORDER — POLYSACCHARIDE IRON COMPLEX 150 MG PO CAPS: 150.0000 mg | ORAL_CAPSULE | Freq: Every day | ORAL | Status: DC

## 2015-04-05 MED ORDER — THERA M PLUS PO TABS: 1.0000 | ORAL_TABLET | Freq: Every morning | ORAL | Status: DC

## 2015-04-05 MED ORDER — AMOXICILLIN-POT CLAVULANATE 500-125 MG PO TABS: 1.0000 | ORAL_TABLET | Freq: Two times a day (BID) | ORAL | Status: AC

## 2015-04-05 MED ORDER — PIPERACILLIN-TAZOBACTAM 3.375 G IVPB
3.3750 g | Freq: Three times a day (TID) | INTRAVENOUS | Status: DC
  Filled 2015-04-05 (×3): qty 50

## 2015-04-05 MED ORDER — AMOXICILLIN-POT CLAVULANATE 500-125 MG PO TABS: 1.0000 | ORAL_TABLET | Freq: Three times a day (TID) | ORAL | Status: DC

## 2015-04-05 NOTE — Progress Notes (Signed)
CSW spoke w/ patient's sister Harriett Sineancy. Harriett Sineancy agrees w/ plan to send pt back to [facility].  Patient will DC to: [facility] Anticipated DC date: 04/05/15 Family notified: Sister Transport by: [facility]  CSW signing off.  Cristobal GoldmannNadia Tyke Outman, [place]WA Clinical Social Worker [phone]

## 2015-04-05 NOTE — Care Management Important Message (Signed)
Important Message  Patient Details  Name: Justin Sherman MRN: [id number] Date of Birth: 09/05/1940   Medicare Important Message Given:  Yes    [name], [name] 04/05/2015, 12:54 PM

## 2015-04-05 NOTE — Progress Notes (Signed)
Pharmacy Antibiotic Follow-up Note  Justin Sherman is a 74 y.o. year-old male admitted on 03/30/2015. The patient is currently on day 6 of Zosyn for UTI (POA) in setting of uretal stent placement. WBC 14, afebrile. CrCl improved to 24 mL/min   Assessment/Plan: 1. Change to Zosyn 3.375g gram IV q8h infused over 30 minutes due to improvement in renal function 2. Consider treating for 7-14 days given uretal stent based on symptoms    Temp (24hrs), Avg:99.2 F (37.3 C), Min:98.4 F (36.9 C), Max:102.4 F (39.1 C)   Recent Labs Lab 03/30/15 1345 03/31/15 0248 04/01/15 0259 04/02/15 0631 04/04/15 0605  WBC 24.7* 17.7* 11.7* 11.5* 14.0*     Recent Labs Lab 04/01/15 0259 04/02/15 0631 04/03/15 0219 04/04/15 0605 04/05/15 0720  CREATININE 3.61* 3.64* 3.15* 2.72* 2.36*  *CKD 4 at baseline*  No Known Allergies  Antimicrobials this admission: 12/21 Zosyn >>   Levels/dose changes this admission: None to date   Microbiology results: 12/21 BCx: ngtd 12/21 UCx: "multiple species present suggest recollect "    Justin Sherman, PharmD., BCPS Clinical Pharmacist Pager [phone]

## 2015-04-05 NOTE — Progress Notes (Signed)
Pt left unit via stretcher with PTAR. Lawson RadarHeather M Rosa Wyly

## 2015-04-05 NOTE — Progress Notes (Signed)
Daily Progress Note   Patient Name: Justin Sherman       Date: 04/05/2015 DOB: March 17, 1941  Age: 74 y.o. MRN#: [id number] Attending Physician: Starleen Arms, MD Primary Care Physician: Margit Hanks, MD Admit Date: 03/30/2015  Reason for Consultation/Follow-up: Establishing goals of care  Subjective: Justin Sherman is a 74 year old gentleman with underlying dementia who is admitted with infection. I called to speak again with his POA, Harriett Sine, regarding long-term care plans moving forward. He continues to improve this admission and plans for discharge back to skilled facility later today.  Justin Sherman remains confused and is a poor historian. See goals of care below for discussion with his POA.  Length of Stay: 6 days  Current Medications: Scheduled Meds:  . antiseptic oral rinse  7 mL Mouth Rinse q12n4p  . chlorhexidine  15 mL Mouth Rinse BID  . heparin  5,000 Units Subcutaneous 3 times per day  . insulin aspart  0-9 Units Subcutaneous TID WC  . piperacillin-tazobactam (ZOSYN)  IV  2.25 g Intravenous 3 times per day  . sodium chloride  3 mL Intravenous Q12H    Continuous Infusions: . sodium chloride 50 mL/hr at 04/03/15 2038    PRN Meds: acetaminophen, ondansetron **OR** ondansetron (ZOFRAN) IV  Physical Exam: Physical Exam             General: Alert, awake, in no acute distress. Confused  HEENT: No bruits, no goiter, no JVD Heart: Regular rate and rhythm. No murmur appreciated. Lungs: Fair air movement Abdomen: Soft, nontender, nondistended, positive bowel sounds.  Ext: Some edema bilaterally Skin: Warm and dry Neuro: Grossly intact, nonfocal. Patient confused   Vital Signs: BP 130/74 mmHg  Pulse 91  Temp(Src) 98.6 F (37 C) (Oral)  Resp 18  Ht 6' (1.829 m)   Wt 64.6 kg (142 lb 6.7 oz)  BMI 19.31 kg/m2  SpO2 98% SpO2: SpO2: 98 % O2 Device: O2 Device: Not Delivered O2 Flow Rate: O2 Flow Rate (L/min): 2 L/min  Intake/output summary: No intake or output data in the 24 hours ending 04/05/15 1026 LBM: Last BM Date: 04/04/15 Baseline Weight: Weight: 64.6 kg (142 lb 6.7 oz) Most recent weight: Weight: 64.6 kg (142 lb 6.7 oz)       Palliative Assessment/Data: Flowsheet Rows  Most Recent Value   Intake Tab    Referral Department  Hospitalist   Unit at Time of Referral  Intermediate Care Unit   Palliative Care Primary Diagnosis  Neurology   Date Notified  03/30/15   Palliative Care Type  New Palliative care   Reason for referral  Clarify Goals of Care, Counsel Regarding Hospice   Date of Admission  03/30/15   # of days IP prior to Palliative referral  0   Clinical Assessment    Psychosocial & Spiritual Assessment    Palliative Care Outcomes       Additional Data Reviewed: CBC    Component Value Date/Time   WBC 14.0* 04/04/2015 0605   WBC 10.8 07/02/2014   RBC 3.41* 04/04/2015 0605   HGB 8.7* 04/04/2015 0605   HCT 29.7* 04/04/2015 0605   PLT 262 04/04/2015 0605   MCV 87.1 04/04/2015 0605   MCH 25.5* 04/04/2015 0605   MCHC 29.3* 04/04/2015 0605   RDW 14.7 04/04/2015 0605   LYMPHSABS 0.9 04/01/2015 0259   MONOABS 0.9 04/01/2015 0259   EOSABS 0.0 04/01/2015 0259   BASOSABS 0.0 04/01/2015 0259    CMP     Component Value Date/Time   NA 137 04/05/2015 0720   NA 145 07/02/2014   K 3.8 04/05/2015 0720   CL 104 04/05/2015 0720   CO2 26 04/05/2015 0720   GLUCOSE 103* 04/05/2015 0720   BUN 31* 04/05/2015 0720   BUN 42* 07/02/2014   CREATININE 2.36* 04/05/2015 0720   CREATININE 3.3* 07/02/2014   CALCIUM 8.8* 04/05/2015 0720   PROT 9.2* 03/30/2015 1345   ALBUMIN 1.7* 04/02/2015 0631   AST 26 03/30/2015 1345   ALT 16* 03/30/2015 1345   ALKPHOS 99 03/30/2015 1345   BILITOT 0.4 03/30/2015 1345   GFRNONAA 25* 04/05/2015  0720   GFRAA 29* 04/05/2015 0720       Problem List:  Patient Active Problem List   Diagnosis Date Noted  . Pressure ulcer 03/31/2015  . Uremia 03/31/2015  . Acute UTI 03/31/2015  . Acute encephalopathy 03/30/2015  . Hyperkalemia 03/30/2015  . Hyponatremia 03/30/2015  . End of life care 03/30/2015  . Dehydration   . 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 Acute renal failure superimposed on stage 3 chronic kidney disease (HCC) 08/08/2014  . Chronic constipation 07/03/2014  . Anemia in CKD (chronic kidney disease) 03/09/2013  . 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 Hyperlipidemia associated with type 2 diabetes mellitus (HCC) 08/18/2012  . COPD, moderate (HCC) 08/18/2012  . Depression 08/18/2012  . CKD (chronic kidney disease) stage 3, GFR 30-59 ml/min 08/17/2012  . Ureteral calculi 07/06/2012  . Urinary retention 08/04/2011  . Type 2 diabetes mellitus with neurological manifestations, controlled [facility](HCC)      Palliative Care Assessment & Plan    1.Code Status:  DNR    Code Status Orders        Start     Ordered   03/30/15 1643  Do not attempt resuscitation (DNR)   Continuous    Question Answer Comment  In the event of cardiac or respiratory ARREST Do not call a "code blue"   In the event of cardiac or respiratory ARREST Do not perform Intubation, CPR, defibrillation or ACLS   In the event of cardiac or respiratory ARREST Use medication by any route, position, wound care, and other measures to relive pain and suffering. May use oxygen, suction and manual treatment of  airway obstruction as needed for comfort.      03/30/15 1700       2. Goals of Care/Additional Recommendations: I called spoke with his POA, Harriett Sine. Unfortunately, she has pneumonia will not be able to come to the hospital to complete a MOST form prior to discharge.  Discussed long-term care planning for Justin Sherman moving  forward. She reports understanding that he has chronic diseases that are going to continue to worsen. She feels as though he benefited from hospitalization this encounter, but she is not sure that he will continue to do so in the future. Plan is to complete antibiotics for current infection as well as possible stent replacement by urology and a couple of weeks. Following this, she reports she would be agreeable to enrollment in hospice as this would likely be the best way to serve Justin Sherman moving forward. I advised her that this could be accomplished through referral by the provider at his long-term care facility.  4. Palliative Prophylaxis:   Bowel Regimen and Delirium Protocol  5. Prognosis: Less than 6 months  6. Discharge Planning:  Skilled facility. He is going to plan course of antibiotics followed by possible stent change. His POA is agreement that he may benefit from enrollment in hospice following these acute interventions.  Care plan was discussed with Dr. Randol Kern and Harriett Sine [facility])  Thank you for allowing the Palliative Medicine Team to assist in the care of this patient.   Time In: 1020 Time Out: 1035 Total Time 15 Prolonged Time Billed no        Romie Minus, MD  04/05/2015, 10:26 AM  Please contact Palliative Medicine Team phone at [phone] for questions and concerns.

## 2015-04-05 NOTE — Discharge Instructions (Signed)
Follow with Primary MD [name], Justin D, MD in 7 days     Activity: As tolerated with Full fall precautions use walker/cane & assistance as needed   Disposition SNF   Diet: Dysphagia 2 with thin liquid. , with feeding assistance and aspiration precautions.  For Heart failure patients - Check your Weight same time everyday, if you gain over 2 pounds, or you develop in leg swelling, experience more shortness of breath or chest pain, call your Primary MD immediately. Follow Cardiac Low Salt Diet and 1.5 lit/day fluid restriction.   On your next visit with your primary care physician please Get Medicines reviewed and adjusted.   Please request your Prim.MD to go over all Hospital Tests and Procedure/Radiological results at the follow up, please get all Hospital records sent to your Prim MD by signing hospital release before you go home.   If you experience worsening of your admission symptoms, develop shortness of breath, life threatening emergency, suicidal or homicidal thoughts you must seek medical attention immediately by calling 911 or calling your MD immediately  if symptoms less severe.  You Must read complete instructions/literature along with all the possible adverse reactions/side effects for all the Medicines you take and that have been prescribed to you. Take any new Medicines after you have completely understood and accpet all the possible adverse reactions/side effects.   Do not drive, operating heavy machinery, perform activities at heights, swimming or participation in water activities or provide baby sitting services if your were admitted for syncope or siezures until you have seen by Primary MD or a Neurologist and advised to do so again.  Do not drive when taking Pain medications.    Do not take more than prescribed Pain, Sleep and Anxiety Medications  Special Instructions: If you have smoked or chewed Tobacco  in the last 2 yrs please stop smoking, stop any regular  Alcohol  and or any Recreational drug use.  Wear Seat belts while driving.   Please note  You were cared for by a hospitalist during your hospital stay. If you have any questions about your discharge medications or the care you received while you were in the hospital after you are discharged, you can call the unit and asked to speak with the hospitalist on call if the hospitalist that took care of you is not available. Once you are discharged, your primary care physician will handle any further medical issues. Please note that NO REFILLS for any discharge medications will be authorized once you are discharged, as it is imperative that you return to your primary care physician (or establish a relationship with a primary care physician if you do not have one) for your aftercare needs so that they can reassess your need for medications and monitor your lab values.

## 2015-04-05 NOTE — Progress Notes (Signed)
Called report to Brittney at skilled nursing facility who is familiar with this patient. All information given, nurse states, is patient's baseline. Gave phone number for hospital if further questions arise. Lawson RadarHeather M Gia Lusher

## 2015-04-05 NOTE — Discharge Summary (Addendum)
Justin Sherman, is a 74 y.o. male  DOB May 20, 1940  MRN [id number].  Admission date:  03/30/2015  Admitting Physician  Ozella Rocks, MD  Discharge Date:  04/05/2015   Primary MD  Margit Hanks, MD  Recommendations for primary care physician for things to follow:  - Patient will need to follow with urology in 1 week from discharge regarding stent replacement. - Patient to be seen by palliative care   CODE STATUS: DO NOT RESUSCITATE  Admission Diagnosis  Dehydration [E86.0] Hyperkalemia [E87.5] Hyponatremia [E87.1] Renal failure [N19] ARF (acute renal failure) (HCC) [N17.9] Sepsis due to urinary tract infection (HCC) [A41.9, N39.0]   Discharge Diagnosis  Dehydration [E86.0] Hyperkalemia [E87.5] Hyponatremia [E87.1] Renal failure [N19] ARF (acute renal failure) (HCC) [N17.9] Sepsis due to urinary tract infection (HCC) [A41.9, N39.0]    Principal Problem:   Acute encephalopathy Active Problems:   Type 2 diabetes mellitus with neurological manifestations, controlled (HCC)   CKD (chronic kidney disease) stage 3, GFR 30-59 ml/min   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 Alterations of sensations, late effect of cerebrovascular disease   . Acute conjunctivitis, unspecified   . Aortic aneurysm of unspecified site without mention of rupture   . Flaccid hemiplegia affecting dominant side (HCC)   . Type II or unspecified type diabetes mellitus without mention of complication, not stated as uncontrolled   . Depressive disorder, not elsewhere  classified   . Unspecified essential hypertension   . Obstructive chronic bronchitis with exacerbation (HCC)   . Anxiety state, unspecified   . Esophageal reflux   . Other and unspecified hyperlipidemia   . Pressure ulcer, other site(707.09)   . Dementia   . Chronic kidney disease 08/04/2011    Acute renal failure per note 08/04/2011-Dr. Art Chilton Si  . Stroke (HCC)   . Type 2 diabetes mellitus with neurological manifestations, controlled [name]     Past Surgical History  Procedure Laterality Date  . Cystoscopy w/ ureteral stent placement  08/07/2011    Procedure: CYSTOSCOPY WITH RETROGRADE PYELOGRAM/URETERAL STENT PLACEMENT;  Surgeon: Kathi Ludwig, MD;  Location: [facility];  Service: Urology;  Laterality: Bilateral;  bilateral stent placement ureterosopy  . Cystoscopy/retrograde/ureteroscopy  12/07/2011    Procedure: CYSTOSCOPY/RETROGRADE/URETEROSCOPY;  Surgeon: Kathi Ludwig, MD;  Location: [facility];  Service: Urology;  Laterality: Bilateral;  . Cystoscopy w/ ureteral stent placement  12/07/2011    Procedure: CYSTOSCOPY WITH STENT REPLACEMENT;  Surgeon: Kathi Ludwig, MD;  Location: [facility];  Service: Urology;  Laterality: Bilateral;  (BIL) JJ STENT EXCHANGE  . Cystoscopy with urethral dilatation  12/07/2011    Procedure:  CYSTOSCOPY WITH URETHRAL DILATATION;  Surgeon: Kathi Ludwig, MD;  Location: [facility];  Service: Urology;;       History of present illness and  Hospital Course:     Kindly see H&P for history of present illness and admission details, please review complete Labs, Consult reports and Test reports for all details in brief  HPI  from the history and physical done on the day of admission 03/30/2015  HPI: Justin Sherman is a 74 y.o. male  Level V caveat: Patient presenting in altered mental state and presenting from nursing home. History provided by patient's brother, EMS, nursing home report, and EDP. Per report patient had acute change in mental  status today. Patient not responding to verbal commands which he usually does. Patient became completely nonverbal and tachypnea At the nursing home. No further history available.  Hospital Course  74 y.o. male who presented to the ED from his SNF in an altered mental state. Per report patient had acute change in mental status the day of his admit, not responding to verbal commands which he usually does. Patient became completely nonverbal and tachypneic, patient acute encephalopathy felt most likely due to worsening renal function and uremia, infection secondary to UTI, seen by nephrology, renal function continues to improve on gentle hydration, sepsis has resolved as well, as is been treated on Zosyn for UTI, family do not want aggressive measures, the want to continue with conservative medical management only.  Toxic metabolic encephalopathy  - secondary to acute urinary tract infection, uremia, and hyponatremia in the setting of dementia, dehydration and infection - Continues to improve, but remains quite confused , agent with baseline advanced dementia. - CT head with no acute finding  acute on chronic kidney disease stage IV - secondary to prerenal azotemia plus or minus ATN - baseline crt appears to be 2.0-2.4 - Renal ultrasound with mild bilateral hydronephrosis, bilateral ureteral stents evident, bilateral nephrolithiasis - Nephrology has seen - renal fxn continues to improve w/ gentle hydration , creatinine is 2.3 on discharge, back to baseline.  history of bilateral hydronephrosis status post ureteral stents - Nephrology suggests Urology eval to consider removing stents, she continues to improve, so for his pyuria to clear his mental status changed, plan was discussed with Dr. Vernie Ammons, they will arrange for stent change as an outpatient, and to continue covering with antibiotics, unfortunately urine culture growing multiple species, so we'll change Zosyn to Augmentin on discharge to  continue total of 14 days, by then he'll has his stents exchanged.  urinary tract infection Culture data not helpful - treated with IV Zosyn during hospital stay, discharged on total of 2 weeks of oral Augmentin as an outpatient.  Hyperkalemia Due to renal failure - resolved   Hypokalemia   dehydration Clinically resolved , average fluid intake  new left bundle branch block / sinus tachycardia No further w/u to be pursued   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 Patient typically can follow basic commands - SLP suggests D2 diet w/ thin liquids   hyponatremia secondary to hypovolemic hyponatremia and acute renal failure - resolved   Normocytic anemia No evidence of blood loss - likely chronic due to CKD w/ acute "drop" reflective of volume expansion - Hgb appears to have stabilized.  DM A1c 7.5 - discharged on insulin sliding scale   Discharge Condition:  Stable, But frail   Follow UP  Follow-up Information    Call Kathi Ludwig, MD.   Specialty:  Urology   Why:  For an appointment in 1-2 weeks when you get home.   Contact information:   [address] [place] [address] [phone]         Discharge Instructions  and  Discharge Medications     Discharge Instructions    Discharge instructions    Complete by:  As directed   Follow with Primary MD Margit Hanks, MD in 7 days     Activity: As tolerated with Full fall precautions use walker/cane & assistance as needed   Disposition SNF   Diet: Dysphagia 2 with thin liquid. , with feeding assistance and aspiration precautions.  For Heart failure patients - Check your Weight same time everyday, if you gain over 2 pounds, or you develop in leg swelling, experience more shortness of breath or chest pain, call your Primary MD immediately. Follow Cardiac Low Salt Diet and 1.5 lit/day fluid restriction.   On your next visit with your primary care physician please Get Medicines  reviewed and adjusted.   Please request your Prim.MD to go over all Hospital Tests and Procedure/Radiological results at the follow up, please get all Hospital records sent to your Prim MD by signing hospital release before you go home.   If you experience worsening of your admission symptoms, develop shortness of breath, life threatening emergency, suicidal or homicidal thoughts you must seek medical attention immediately by calling 911 or calling your MD immediately  if symptoms less severe.  You Must read complete instructions/literature along with all the possible adverse reactions/side effects for all the Medicines you take and that have been prescribed to you. Take any new Medicines after you have completely understood and accpet all the possible adverse reactions/side effects.   Do not drive, operating heavy machinery, perform activities at heights, swimming or participation in water activities or provide baby sitting services if your were admitted for syncope or siezures until you have seen by Primary MD or a Neurologist and advised to do so again.  Do not drive when taking Pain medications.    Do not take more than prescribed Pain, Sleep and Anxiety Medications  Special Instructions: If you have smoked or chewed Tobacco  in the last 2 yrs please stop smoking, stop any regular Alcohol  and or any Recreational drug use.  Wear Seat belts while driving.   Please note  You were cared for by a hospitalist during your hospital stay. If you have any questions about your discharge medications or the care you received while you were in the hospital after you are discharged, you can call the unit and asked to speak with the hospitalist on call if the hospitalist that took care of you is not available. Once you are discharged, your primary care physician will handle any further medical issues. Please note that NO REFILLS for any discharge medications will be authorized once you are discharged,  as it is imperative that you return to your primary care physician (or establish a relationship with a primary care physician if you do not have one) for your aftercare needs so that they can reassess your need for medications and monitor your lab values.            Medication List    STOP taking these medications        insulin glargine 100 UNIT/ML injection  Commonly known as:  LANTUS      TAKE these medications        acetaminophen 500 MG  tablet  Commonly known as:  TYLENOL  Take 500 mg by mouth every 8 (eight) hours as needed (for pain).     albuterol (2.5 MG/3ML) 0.083% nebulizer solution  Commonly known as:  PROVENTIL  Take 2.5 mg by nebulization every 2 (two) hours as needed for wheezing.     amoxicillin-clavulanate 500-125 MG tablet  Commonly known as:  AUGMENTIN  Take 1 tablet (500 mg total) by mouth 2 (two) times daily. Finish total of 14 days, stop date January /9 / 2017     atorvastatin 20 MG tablet  Commonly known as:  LIPITOR  Take 20 mg by mouth at bedtime. For hyperlipidemia     budesonide-formoterol 160-4.5 MCG/ACT inhaler  Commonly known as:  SYMBICORT  Inhale 2 puffs into the lungs 2 (two) times daily.     citalopram 20 MG tablet  Commonly known as:  CELEXA  Take 20 mg by mouth at bedtime. For depression     Cranberry 450 MG Caps  Take 450 mg by mouth 2 (two) times daily.     guaiFENesin 600 MG 12 hr tablet  Commonly known as:  MUCINEX  Take 600 mg by mouth 2 (two) times daily. For chronic bronchitis     insulin aspart 100 UNIT/ML injection  Commonly known as:  novoLOG  Inject 0-9 Units into the skin 3 (three) times daily with meals.     ipratropium-albuterol 0.5-2.5 (3) MG/3ML Soln  Commonly known as:  DUONEB  Take 3 mLs by nebulization every 6 (six) hours as needed. For shortness of breath     iron polysaccharides 150 MG capsule  Commonly known as:  NIFEREX  Take 1 capsule (150 mg total) by mouth daily.     multivitamins ther. w/minerals  Tabs tablet  Take 1 tablet by mouth every morning.     polyethylene glycol packet  Commonly known as:  MIRALAX / GLYCOLAX  Take 17 g by mouth daily as needed for moderate constipation. For constipation     thiamine 100 MG tablet  Take 100 mg by mouth every morning.          Diet and Activity recommendation: See Discharge Instructions above   Consults obtained -  Nephrology Palliative Care PCCM Urology via phone   Major procedures and Radiology Reports - PLEASE review detailed and final reports for all details, in brief -      Ct Head Wo Contrast  04/03/2015  CLINICAL DATA:  74 year old with altered mental status and increased confusion. History of CVA and urinary tract infection. EXAM: CT HEAD WITHOUT CONTRAST TECHNIQUE: Contiguous axial images were obtained from the base of the skull through the vertex without intravenous contrast. COMPARISON:  Head CT 02/15/2014 FINDINGS: Again demonstrated is extensive encephalomalacia throughout the left hemisphere consistent with an old MCA stroke. There is moderate generalized atrophy. No evidence of acute intracranial hemorrhage, mass lesion, brain edema or extra-axial fluid collection. Intracranial vascular calcifications are present. There is minimal mucosal thickening in the maxillary sinuses. The visualized paranasal sinuses, mastoid air cells and middle ears are otherwise clear. The calvarium is intact. IMPRESSION: No acute intracranial findings demonstrated. Stable atrophy and extensive encephalomalacia from previous left MCA infarct. Electronically Signed   By: Carey BullocksWilliam  Veazey M.D.   On: 04/03/2015 15:50   [place] Renal Port  03/30/2015  CLINICAL DATA:  Acute renal failure, history of ureteral stents and bilateral nephrolithiasis EXAM: RENAL / URINARY TRACT ULTRASOUND COMPLETE COMPARISON:  06/18/2012, 07/09/2014 FINDINGS: Right Kidney: Length: 10.7 cm. Mild hydronephrosis.  Echogenic shadowing mid and lower pole renal calculi. Proximal  ureter is mildly dilated. Ureteral stent noted. Left Kidney: Length: 12.8 cm. Mild hydronephrosis. Ureteral stent in place. Echogenic shadowing intrarenal calculi measuring 2 cm in the upper pole and 1.8 cm in the lower pole. Upper pole renal cyst measures 4.3 cm. Bladder: Ureteral stents noted in the bladder.  Bladder is underdistended. IMPRESSION: Mild bilateral hydronephrosis. Bilateral nephrolithiasis Bilateral ureteral stents evident. Electronically Signed   By: Judie Petit.  Shick M.D.   On: 03/30/2015 17:25   Dg Chest Port 1 View  03/30/2015  CLINICAL DATA:  Nausea, vomiting.  Altered mental status. EXAM: PORTABLE CHEST 1 VIEW COMPARISON:  08/17/2012 FINDINGS: The heart size and mediastinal contours are within normal limits. Both lungs are clear. The visualized skeletal structures are unremarkable. IMPRESSION: No active disease. Electronically Signed   By: Charlett Nose M.D.   On: 03/30/2015 14:27    Micro Results    Recent Results (from the past 240 hour(s))  Culture, blood (Routine X 2) w Reflex to ID Panel     Status: None   Collection Time: 03/30/15  2:00 PM  Result Value Ref Range Status   Specimen Description BLOOD LEFT ANTECUBITAL  Final   Special Requests BOTTLES DRAWN AEROBIC AND ANAEROBIC 5CC  Final   Culture NO GROWTH 5 DAYS  Final   Report Status 04/04/2015 FINAL  Final  Culture, Urine     Status: None   Collection Time: 03/30/15  2:00 PM  Result Value Ref Range Status   Specimen Description URINE, RANDOM  Final   Special Requests NONE  Final   Culture MULTIPLE SPECIES PRESENT, SUGGEST RECOLLECTION  Final   Report Status 04/01/2015 FINAL  Final  Culture, blood (Routine X 2) w Reflex to ID Panel     Status: None   Collection Time: 03/30/15  3:10 PM  Result Value Ref Range Status   Specimen Description BLOOD RIGHT HAND  Final   Special Requests IN PEDIATRIC BOTTLE 4CC  Final   Culture NO GROWTH 5 DAYS  Final   Report Status 04/04/2015 FINAL  Final  MRSA PCR Screening     Status:  None   Collection Time: 03/30/15 10:06 PM  Result Value Ref Range Status   MRSA by PCR NEGATIVE NEGATIVE Final    Comment:        The GeneXpert MRSA Assay (FDA approved for NASAL specimens only), is one component of a comprehensive MRSA colonization surveillance program. It is not intended to diagnose MRSA infection nor to guide or monitor treatment for MRSA infections.        Today   Subjective:   Justin Sherman today alert and interactive, confused, denies any chest pain, shortness of breath, headache  Objective:   Blood pressure 130/74, pulse 91, temperature 98.6 F (37 C), temperature source Oral, resp. rate 18, height 6' (1.829 m), weight 64.6 kg (142 lb 6.7 oz), SpO2 98 %.  No intake or output data in the 24 hours ending 04/05/15 1246  Exam General: No acute respiratory distress - alert, negative but confused  Lungs: Clear to auscultation bilaterally without wheezes or crackles Cardiovascular: One is 2+, no rubs or murmurs Abdomen: Nontender, nondistended, soft, bowel sounds positive, no rebound, no ascites, no appreciable mass Extremities: No significant cyanosis, clubbing, edema bilateral lower extremities Data Review   CBC w Diff:  Lab Results  Component Value Date   WBC 14.0* 04/04/2015   WBC 10.8 07/02/2014   HGB 8.7* 04/04/2015  HCT 29.7* 04/04/2015   PLT 262 04/04/2015   LYMPHOPCT 7 04/01/2015   MONOPCT 7 04/01/2015   EOSPCT 0 04/01/2015   BASOPCT 0 04/01/2015    CMP:  Lab Results  Component Value Date   NA 137 04/05/2015   NA 145 07/02/2014   K 3.8 04/05/2015   CL 104 04/05/2015   CO2 26 04/05/2015   BUN 31* 04/05/2015   BUN 42* 07/02/2014   CREATININE 2.36* 04/05/2015   CREATININE 3.3* 07/02/2014   GLU 190 07/02/2014   PROT 9.2* 03/30/2015   ALBUMIN 1.7* 04/02/2015   BILITOT 0.4 03/30/2015   ALKPHOS 99 03/30/2015   AST 26 03/30/2015   ALT 16* 03/30/2015  .   Total Time in preparing paper work, data evaluation and todays  exam - 35 minutes  ELGERGAWY, DAWOOD M.D on 04/05/2015 at 12:46 PM  [facility]   Office  [phone]

## 2015-04-07 ENCOUNTER — Non-Acute Institutional Stay (SKILLED_NURSING_FACILITY): Payer: Medicare Other | Admitting: Internal Medicine

## 2015-04-07 ENCOUNTER — Encounter: Payer: Self-pay | Admitting: Internal Medicine

## 2015-04-07 DIAGNOSIS — G9340 Encephalopathy, unspecified: Secondary | ICD-10-CM

## 2015-04-07 DIAGNOSIS — N179 Acute kidney failure, unspecified: Secondary | ICD-10-CM

## 2015-04-07 DIAGNOSIS — N390 Urinary tract infection, site not specified: Secondary | ICD-10-CM

## 2015-04-07 DIAGNOSIS — F329 Major depressive disorder, single episode, unspecified: Secondary | ICD-10-CM

## 2015-04-07 DIAGNOSIS — N184 Chronic kidney disease, stage 4 (severe): Secondary | ICD-10-CM

## 2015-04-07 DIAGNOSIS — N139 Obstructive and reflux uropathy, unspecified: Secondary | ICD-10-CM | POA: Diagnosis not present

## 2015-04-07 DIAGNOSIS — I69359 Hemiplegia and hemiparesis following cerebral infarction affecting unspecified side: Secondary | ICD-10-CM

## 2015-04-07 DIAGNOSIS — D638 Anemia in other chronic diseases classified elsewhere: Secondary | ICD-10-CM

## 2015-04-07 DIAGNOSIS — I452 Bifascicular block: Secondary | ICD-10-CM | POA: Diagnosis not present

## 2015-04-07 DIAGNOSIS — I69320 Aphasia following cerebral infarction: Secondary | ICD-10-CM | POA: Diagnosis not present

## 2015-04-07 DIAGNOSIS — F32A Depression, unspecified: Secondary | ICD-10-CM

## 2015-04-07 DIAGNOSIS — E1149 Type 2 diabetes mellitus with other diabetic neurological complication: Secondary | ICD-10-CM

## 2015-04-07 NOTE — Assessment & Plan Note (Signed)
Patient typically can follow basic commands - SLP suggests D2 diet w/ thin liquids ; L hemiparesis

## 2015-04-07 NOTE — Assessment & Plan Note (Signed)
SNF - reported no further w/u needed

## 2015-04-07 NOTE — Assessment & Plan Note (Signed)
SNF - A1c was 7.5; cont SSI of 5 u for CBG > 150

## 2015-04-07 NOTE — Progress Notes (Signed)
MRN: [id number] Name: Justin Sherman  Sex: male Age: 74 y.o. DOB: 22-Jul-1940  PSC #: Ronni Rumble Facility/Room:217 Level Of Care: SNF Provider: Merrilee Seashore D Emergency Contacts: Extended Emergency Contact Information Primary Emergency Contact: Camdin, [place], [place] [address] [place] of [place] Home Phone: [phone] Relation: Son Guardian: Ashon, [place] Home Phone: [phone] Mobile Phone: [phone] Relation: Legal Guardian  Code Status:   Allergies: Review of patient's allergies indicates no known allergies.  Chief Complaint  Patient presents with  . Readmit To SNF    HPI: Patient is 74 y.o. male who at SNF was not responding to verbal commands which he usually did and  became completely nonverbal and tachypnea. Pt was admitted to [facility] from 12/21-27 for ARF and encephalopathy 2/2 UTI. Hospital course was complicated by hyponatremia. Pt is admitted back to SNF with generalized weakness and for residential care. While at SNF pt will be followed for DM, tx with SSI, anemia, tx with iron, and depression tx with celexa.  Past Medical History  Diagnosis Date  . Alterations of sensations, late effect of cerebrovascular disease   . Acute conjunctivitis, unspecified   . Aortic aneurysm of unspecified site without mention of rupture   . Flaccid hemiplegia affecting dominant side (HCC)   . Type II or unspecified type diabetes mellitus without mention of complication, not stated as uncontrolled   . Depressive disorder, not elsewhere classified   . Unspecified essential hypertension   . Obstructive chronic bronchitis with exacerbation (HCC)   . Anxiety state, unspecified   . Esophageal reflux   . Other and unspecified hyperlipidemia   . Pressure ulcer, other site(707.09)   . Dementia   . Chronic kidney disease 08/04/2011    Acute renal failure per note 08/04/2011-Dr. Art Chilton Si  . Stroke (HCC)   . Type 2 diabetes mellitus with  neurological manifestations, controlled [facility])     Past Surgical History  Procedure Laterality Date  . Cystoscopy w/ ureteral stent placement  08/07/2011    Procedure: CYSTOSCOPY WITH RETROGRADE PYELOGRAM/URETERAL STENT PLACEMENT;  Surgeon: Kathi Ludwig, MD;  Location: [facility];  Service: Urology;  Laterality: Bilateral;  bilateral stent placement ureterosopy  . Cystoscopy/retrograde/ureteroscopy  12/07/2011    Procedure: CYSTOSCOPY/RETROGRADE/URETEROSCOPY;  Surgeon: Kathi Ludwig, MD;  Location: [facility];  Service: Urology;  Laterality: Bilateral;  . Cystoscopy w/ ureteral stent placement  12/07/2011    Procedure: CYSTOSCOPY WITH STENT REPLACEMENT;  Surgeon: Kathi Ludwig, MD;  Location: [facility];  Service: Urology;  Laterality: Bilateral;  (BIL) JJ STENT EXCHANGE  . Cystoscopy with urethral dilatation  12/07/2011    Procedure: CYSTOSCOPY WITH URETHRAL DILATATION;  Surgeon: Kathi Ludwig, MD;  Location: [facility];  Service: Urology;;      Medication List       This list is accurate as of: 04/07/15 11:59 PM.  Always use your most recent med list.               acetaminophen 500 MG tablet  Commonly known as:  TYLENOL  Take 500 mg by mouth every 8 (eight) hours as needed (for pain).     albuterol (2.5 MG/3ML) 0.083% nebulizer solution  Commonly known as:  PROVENTIL  Take 2.5 mg by nebulization every 2 (two) hours as needed for wheezing.     amoxicillin-clavulanate 500-125 MG tablet  Commonly known as:  AUGMENTIN  Take 1 tablet (500 mg total) by mouth 2 (two) times daily.  Finish total of 14 days, stop date January /9 / 2017     atorvastatin 20 MG tablet  Commonly known as:  LIPITOR  Take 20 mg by mouth at bedtime. For hyperlipidemia     budesonide-formoterol 160-4.5 MCG/ACT inhaler  Commonly known as:  SYMBICORT  Inhale 2 puffs into the lungs 2 (two) times daily.     citalopram 20 MG tablet  Commonly known as:  CELEXA  Take 20 mg by mouth at bedtime. For  depression     Cranberry 450 MG Caps  Take 450 mg by mouth 2 (two) times daily.     guaiFENesin 600 MG 12 hr tablet  Commonly known as:  MUCINEX  Take 600 mg by mouth 2 (two) times daily. For chronic bronchitis     insulin aspart 100 UNIT/ML injection  Commonly known as:  novoLOG  Inject 0-9 Units into the skin 3 (three) times daily with meals.     ipratropium-albuterol 0.5-2.5 (3) MG/3ML Soln  Commonly known as:  DUONEB  Take 3 mLs by nebulization every 6 (six) hours as needed. For shortness of breath     iron polysaccharides 150 MG capsule  Commonly known as:  NIFEREX  Take 1 capsule (150 mg total) by mouth daily.     multivitamins ther. w/minerals Tabs tablet  Take 1 tablet by mouth every morning.     polyethylene glycol packet  Commonly known as:  MIRALAX / GLYCOLAX  Take 17 g by mouth daily as needed for moderate constipation. For constipation     thiamine 100 MG tablet  Take 100 mg by mouth every morning.        No orders of the defined types were placed in this encounter.    Immunization History  Administered Date(s) Administered  . Influenza Whole 01/28/2013  . Influenza-Unspecified 01/18/2015  . Pneumococcal-Unspecified 04/30/2011    Social History  Substance Use Topics  . Smoking status: Never Smoker   . Smokeless tobacco: Not on file  . Alcohol Use: No    Family history is UTO 2/2 dementia  Review of Systems UTO from pt 2/2 dementia; nursing without concerns    Filed Vitals:   04/11/15 1727  BP: 89/54  Pulse: 65  Temp: 96.9 F (36.1 C)  Resp: 24    SpO2 Readings from Last 1 Encounters:  04/11/15 96%        Physical Exam  GENERAL APPEARANCE: Alert, min conversant,  No acute distress.  SKIN: No diaphoresis rash HEAD: Normocephalic, atraumatic  EYES: Conjunctiva/lids clear. Pupils round, reactive. EOMs intact.  EARS: External exam WNL, canals clear. Hearing grossly normal.  NOSE: No deformity or discharge.  MOUTH/THROAT: Lips w/o  lesions  RESPIRATORY: Breathing is even, unlabored. Lung sounds are clear   CARDIOVASCULAR: Heart RRR no murmurs, rubs or gallops. No peripheral edema.   GASTROINTESTINAL: Abdomen is soft, non-tender, not distended w/ normal bowel sounds. GENITOURINARY: Bladder non tender, not distended  MUSCULOSKELETAL: No abnormal joints or musculature NEUROLOGIC:  Cranial nerves 2-12 grossly intact; R side hemiparesis  PSYCHIATRIC: dementia, no behavioral issues  Patient Active Problem List   Diagnosis Date Noted  . Acute urinary obstruction 04/07/2015  . 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 Anemia, chronic disease 04/07/2015  . Pressure ulcer 03/31/2015  . Uremia 03/31/2015  . Acute UTI 03/31/2015  . Acute encephalopathy 03/30/2015  . Hyperkalemia 03/30/2015  . Hyponatremia 03/30/2015  . End of life care 03/30/2015  . Dehydration   .  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 Acute renal failure superimposed on stage 4 chronic kidney disease (HCC) 08/08/2014  . Chronic constipation 07/03/2014  . Anemia in CKD (chronic kidney disease) 03/09/2013  . 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 Hyperlipidemia associated with type 2 diabetes mellitus (HCC) 08/18/2012  . COPD, moderate (HCC) 08/18/2012  . Depression 08/18/2012  . CKD (chronic kidney disease) stage 3, GFR 30-59 ml/min 08/17/2012  . Ureteral calculi 07/06/2012  . Urinary retention 08/04/2011  . Type 2 diabetes mellitus with neurological manifestations, controlled (HCC)     CBC    Component Value Date/Time   WBC 14.0* 04/04/2015 0605   WBC 10.8 07/02/2014   RBC 3.41* 04/04/2015 0605   HGB 8.7* 04/04/2015 0605   HCT 29.7* 04/04/2015 0605   PLT 262 04/04/2015 0605   MCV 87.1 04/04/2015 0605   LYMPHSABS 0.9 04/01/2015 0259   MONOABS 0.9 04/01/2015 0259   EOSABS 0.0 04/01/2015 0259   BASOSABS 0.0 04/01/2015  0259    CMP     Component Value Date/Time   NA 137 04/05/2015 0720   NA 145 07/02/2014   K 3.8 04/05/2015 0720   CL 104 04/05/2015 0720   CO2 26 04/05/2015 0720   GLUCOSE 103* 04/05/2015 0720   BUN 31* 04/05/2015 0720   BUN 42* 07/02/2014   CREATININE 2.36* 04/05/2015 0720   CREATININE 3.3* 07/02/2014   CALCIUM 8.8* 04/05/2015 0720   PROT 9.2* 03/30/2015 1345   ALBUMIN 1.7* 04/02/2015 0631   AST 26 03/30/2015 1345   ALT 16* 03/30/2015 1345   ALKPHOS 99 03/30/2015 1345   BILITOT 0.4 03/30/2015 1345   GFRNONAA 25* 04/05/2015 0720   GFRAA 29* 04/05/2015 0720    Lab Results  Component Value Date   HGBA1C 7.5* 03/30/2015     US Renal Port  03/30/2015  CLINICAL DATA:  Acute renal failure, history of ureteral stents and bilateral nephrolithiasis EXAM: RENAL / URINARY TRACT ULTRASOUND COMPLETE COMPARISON:  06/18/2012, 07/09/2014 FINDINGS: Right Kidney: Length: 10.7 cm. Mild hydronephrosis. Echogenic shadowing mid and lower pole renal calculi. Proximal ureter is mildly dilated. Ureteral stent noted. Left Kidney: Length: 12.8 cm. Mild hydronephrosis. Ureteral stent in place. Echogenic shadowing intrarenal calculi measuring 2 cm in the upper pole and 1.8 cm in the lower pole. Upper pole renal cyst measures 4.3 cm. Bladder: Ureteral stents noted in the bladder.  Bladder is underdistended. IMPRESSION: Mild bilateral hydronephrosis. Bilateral nephrolithiasis Bilateral ureteral stents evident. Electronically Signed   By: Judie Petit.  Shick M.D.   On: 03/30/2015 17:25   Dg Chest Port 1 View  03/30/2015  CLINICAL DATA:  Nausea, vomiting.  Altered mental status. EXAM: PORTABLE CHEST 1 VIEW COMPARISON:  08/17/2012 FINDINGS: The heart size and mediastinal contours are within normal limits. Both lungs are clear. The visualized skeletal structures are unremarkable. IMPRESSION: No active disease. Electronically Signed   By: Charlett Nose M.D.   On: 03/30/2015 14:27    Not all labs, radiology exams or other  studies done during hospitalization come through on my EPIC note; however they are reviewed by me.    Assessment and Plan  Acute encephalopathy secondary to acute urinary tract infection, uremia, and hyponatremia in the setting of dementia, dehydration and infection - Continues to improve, but remains quite confused , agent with baseline advanced dementia. - CT head with no acute finding  Acute renal failure superimposed on stage 4 chronic kidney disease (HCC) secondary to prerenal azotemia plus  or minus ATN - baseline crt appears to be 2.0-2.4 - Renal ultrasound with mild bilateral hydronephrosis, bilateral ureteral stents evident, bilateral nephrolithiasis - Nephrology has seen - renal fxn continues to improve w/ gentle hydration , creatinine is 2.3 on discharge, back to baseline. SNF - will follow with BMP  Acute urinary obstruction Nephrology suggests Urology eval to consider removing stents, she continues to improve, so for his pyuria to clear his mental status changed, plan was discussed with Dr. Ottelin, they will arrange for stent change as anVernie Ammons outpatient, and to continue covering with antibiotics, unfortunately urine culture growing multiple species, so we'll change Zosyn to Augmentin on discharge to continue total of 14 days, by then he'll has his stents exchanged.   Acute UTI Nephrology suggests Urology eval to consider removing stents, she continues to improve, so for his pyuria to clear his mental status changed, plan was discussed with Dr. Vernie Ammonsttelin, they will arrange for stent change as an outpatient, and to continue covering with antibiotics, unfortunately urine culture growing multiple species, so we'll change Zosyn to Augmentin on discharge to continue total of 14 days, by then he'll has his stents exchanged. 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

## 2015-04-07 NOTE — Assessment & Plan Note (Signed)
secondary to prerenal azotemia plus or minus ATN - baseline crt appears to be 2.0-2.4 - Renal ultrasound with mild bilateral hydronephrosis, bilateral ureteral stents evident, bilateral nephrolithiasis - Nephrology has seen - renal fxn continues to improve w/ gentle hydration , creatinine is 2.3 on discharge, back to baseline. SNF - will follow with BMP

## 2015-04-07 NOTE — Assessment & Plan Note (Signed)
secondary to acute urinary tract infection, uremia, and hyponatremia in the setting of dementia, dehydration and infection - Continues to improve, but remains quite confused , agent with baseline advanced dementia. - CT head with no acute finding

## 2015-04-07 NOTE — Assessment & Plan Note (Signed)
SNF - not reported as unstable;cont celexa 20 mg daily

## 2015-04-07 NOTE — Assessment & Plan Note (Signed)
SNF - d/c Hb 8.7; continuing iron reasonable;will recheck CBC

## 2015-04-07 NOTE — Assessment & Plan Note (Signed)
Nephrology suggests Urology eval to consider removing stents, she continues to improve, so for his pyuria to clear his mental status changed, plan was discussed with Dr. Vernie Ammonsttelin, they will arrange for stent change as an outpatient, and to continue covering with antibiotics, unfortunately urine culture growing multiple species, so we'll change Zosyn to Augmentin on discharge to continue total of 14 days, by then he'll has his stents exchanged. SNF- continue augmentin for total 14 days and set up f/u with urology

## 2015-04-07 NOTE — Assessment & Plan Note (Signed)
Nephrology suggests Urology eval to consider removing stents, she continues to improve, so for his pyuria to clear his mental status changed, plan was discussed with Dr. Vernie Ammonsttelin, they will arrange for stent change as an outpatient, and to continue covering with antibiotics, unfortunately urine culture growing multiple species, so we'll change Zosyn to Augmentin on discharge to continue total of 14 days, by then he'll has his stents exchanged.

## 2015-04-10 NOTE — Progress Notes (Signed)
Patient ID: Justin Sherman, male   DOB: 04/04/1941, 75 y.o.   MRN: [id number]    Facility:  [facility]      No Known Allergies  Chief Complaint  Patient presents with  . Medical Management of Chronic Issues    HPI:  He is a long term resident of this facility being seen for the management of his chronic illnesses. His current weight is 159 pounds. He is unable to participate in the hpi or ros. There are no nursing concerns at this time.    Past Medical History  Diagnosis Date  . Alterations of sensations, late effect of cerebrovascular disease   . Acute conjunctivitis, unspecified   . Aortic aneurysm of unspecified site without mention of rupture   . Flaccid hemiplegia affecting dominant side (HCC)   . Type II or unspecified type diabetes mellitus without mention of complication, not stated as uncontrolled   . Depressive disorder, not elsewhere classified   . Unspecified essential hypertension   . Obstructive chronic bronchitis with exacerbation (HCC)   . Anxiety state, unspecified   . Esophageal reflux   . Other and unspecified hyperlipidemia   . Pressure ulcer, other site(707.09)   . Dementia   . Chronic kidney disease 08/04/2011    Acute renal failure per note 08/04/2011-Dr. Art Chilton SiGreen  . Stroke (HCC)   . Type 2 diabetes mellitus with neurological manifestations, controlled [facility](HCC)     Past Surgical History  Procedure Laterality Date  . Cystoscopy w/ ureteral stent placement  08/07/2011    Procedure: CYSTOSCOPY WITH RETROGRADE PYELOGRAM/URETERAL STENT PLACEMENT;  Surgeon: Kathi LudwigSigmund I Tannenbaum, MD;  Location: [facility];  Service: Urology;  Laterality: Bilateral;  bilateral stent placement ureterosopy  . Cystoscopy/retrograde/ureteroscopy  12/07/2011    Procedure: CYSTOSCOPY/RETROGRADE/URETEROSCOPY;  Surgeon: Kathi LudwigSigmund I Tannenbaum, MD;  Location: [facility];  Service: Urology;  Laterality: Bilateral;  . Cystoscopy w/ ureteral stent placement  12/07/2011    Procedure: CYSTOSCOPY WITH  STENT REPLACEMENT;  Surgeon: Kathi LudwigSigmund I Tannenbaum, MD;  Location: [facility];  Service: Urology;  Laterality: Bilateral;  (BIL) JJ STENT EXCHANGE  . Cystoscopy with urethral dilatation  12/07/2011    Procedure: CYSTOSCOPY WITH URETHRAL DILATATION;  Surgeon: Kathi LudwigSigmund I Tannenbaum, MD;  Location: [facility];  Service: Urology;;    VITAL SIGNS BP 108/55 mmHg  Pulse 64  Ht 5\' 9"  (1.753 m)  Wt 159 lb (72.122 kg)  BMI 23.47 kg/m2  SpO2 95%  Patient's Medications  ACETAMINOPHEN (TYLENOL) 500 MG TABLET    Take 500 mg by mouth every 8 (eight) hours as needed (for pain).  ALBUTEROL (PROVENTIL) (2.5 MG/3ML) 0.083% NEBULIZER SOLUTION    Take 2.5 mg by nebulization every 2 (two) hours as needed for wheezing.  ATORVASTATIN (LIPITOR) 20 MG TABLET    Take 20 mg by mouth at bedtime. For hyperlipidemia  BUDESONIDE-FORMOTEROL (SYMBICORT) 160-4.5 MCG/ACT INHALER    Inhale 2 puffs into the lungs 2 (two) times daily.  CITALOPRAM (CELEXA) 20 MG TABLET    Take 20 mg by mouth at bedtime. For depression  CRANBERRY 450 MG CAPS    Take 450 mg by mouth 2 (two) times daily.  GUAIFENESIN (MUCINEX) 600 MG 12 HR TABLET    Take 600 mg by mouth 2 (two) times daily. For chronic bronchitis  INSULIN ASPART (NOVOLOG) 100 UNIT/ML INJECTION    Inject 5 Units into the skin 4 (four) times daily -  before meals and at bedtime. Give for CBG > 150. Before meals and at bedtime  INSULIN GLARGINE (LANTUS)  100 UNIT/ML INJECTION    Inject 12 Units into the skin at bedtime. For DM  IPRATROPIUM-ALBUTEROL (DUONEB) 0.5-2.5 (3) MG/3ML SOLN    Take 3 mLs by nebulization every 6 (six) hours as needed. For shortness of breath  IRON POLYSACCHARIDES (NIFEREX) 150 MG CAPSULE    Take 150 mg by mouth daily.  MULTIPLE VITAMINS-MINERALS (MULTIVITAMINS THER. W/MINERALS) TABS    Take 1 tablet by mouth every morning.   POLYETHYLENE GLYCOL (MIRALAX / GLYCOLAX) PACKET    Take 17 g by mouth every morning. For constipation  THIAMINE 100 MG TABLET    Take 100 mg by mouth  every morning.     New Prescriptions  Previous Medications  Modified Medications   Modified Medication Previous Medication  Discontinued Medications     SIGNIFICANT DIAGNOSTIC EXAMS   07-02-14: chest x-ray: no acute disease process    LABS REVIEWED:   06-17-14: wbc 12.4; hgb 10.1; hct 35.4; mcv 88.2; plt 212; glucose 140; bun 33.4; creat 2.13; k+4.5; na++137; liver normal albumin 3.4; hgb a1c 7.6 07-02-14: wbc 21.8; hgb 10.8; hct 35.2; mcv 87.4; plt 200; glucose 190; bun 41.8; creat 3.34; k+4.5; na++145 07-07-14: wbc 12.2; hgb 10.3; hct 33.6; mcv 85.9; plt 254; glucose 136; bun 46; creat 2.45; k+4.6; na++135 11-03-14: hgb a1c 7.4; chol 144; ldl 73; trig 193; hdl 32  04-14-08: wbc 10.8; hgb 9.3; hct 32.1; mcv 87.8 ;plt 227; glucose 194; bun 29.6; creat 2.12; k+ 4.6; na++137; liver normal albumin 3.4 hgb a1c 7.9     Review of Systems Unable to perform ROS: Dementia    Physical Exam Constitutional: No distress.  Eyes: Conjunctivae are normal.  Neck: Neck supple. No JVD present. No thyromegaly present.  Cardiovascular: Normal rate, regular rhythm and intact distal pulses.   Respiratory: Effort normal and breath sounds normal. No respiratory distress. He has no wheezes.  GI: Soft. Bowel sounds are normal. He exhibits no distension. There is no tenderness.  Musculoskeletal: He exhibits no edema.  Has right hemiparesis   Lymphadenopathy:    He has no cervical adenopathy.  Neurological: He is alert.  Skin: Skin is warm and dry. He is not diaphoretic.  Psychiatric: He has a normal mood and affect.       ASSESSMENT/ PLAN:  1. Copd: he is presently stable ; he does use 02; will continue symbicort 160/4.5 mcg 2 puffs twice daily; mucinex twice daily  has  duoneb every 6 hours as needed will monitor will stop the albuterol   2. Dyslipidemia: will continue lipitor 20 mg daily ldl is 73   3. Anemia: will continue nu-iron  daily his hgb is 9.3  4. Diabetes: is stable will continue  lantus 12 units daily; and novolog 5 units prior to meals for cbg >=150 his hgb a1c is 7.9  5. Depression: is stable will continue celexa 20 mg daily   6. Constipation; will continue miralax daily   7. ckd stage III: no change in status most recent creat is 2.12     8. 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 Renal stone: no recent symptoms present; he has been seen by urology; will monitor his status.   10. Vascular dementia: the stage is advanced;  He is not presently on medications; will not make changes will monitor his status; his most recent weight is 159 pounds.   11. Protein calorie malnutrition: his albumin is 2.9. Will continue supplements per facility protocol. His most  recent weight is 159 pounds.             Synthia Innocent NP [facility]  Contact [phone] Monday through Friday 8am- 5pm  After hours call [phone]

## 2015-04-11 ENCOUNTER — Encounter: Payer: Self-pay | Admitting: Internal Medicine

## 2015-04-12 LAB — CBC AND DIFFERENTIAL
HEMATOCRIT: 25 % — AB (ref 41–53)
HEMOGLOBIN: 7.7 g/dL — AB (ref 13.5–17.5)
Platelets: 115 10*3/uL — AB (ref 150–399)
WBC: 12.1 10*3/mL

## 2015-04-12 LAB — BASIC METABOLIC PANEL
BUN: 22 mg/dL — AB (ref 4–21)
Creatinine: 2 mg/dL — AB (ref 0.6–1.3)
GLUCOSE: 136 mg/dL
Potassium: 4.2 mmol/L (ref 3.4–5.3)
SODIUM: 137 mmol/L (ref 137–147)

## 2015-05-09 ENCOUNTER — Non-Acute Institutional Stay (SKILLED_NURSING_FACILITY): Payer: Medicare Other | Admitting: Internal Medicine

## 2015-05-09 ENCOUNTER — Encounter: Payer: Self-pay | Admitting: Internal Medicine

## 2015-05-09 DIAGNOSIS — I69320 Aphasia following cerebral infarction: Secondary | ICD-10-CM | POA: Diagnosis not present

## 2015-05-09 DIAGNOSIS — E785 Hyperlipidemia, unspecified: Secondary | ICD-10-CM | POA: Diagnosis not present

## 2015-05-09 DIAGNOSIS — E1169 Type 2 diabetes mellitus with other specified complication: Secondary | ICD-10-CM

## 2015-05-09 DIAGNOSIS — N184 Chronic kidney disease, stage 4 (severe): Secondary | ICD-10-CM | POA: Diagnosis not present

## 2015-05-09 DIAGNOSIS — F329 Major depressive disorder, single episode, unspecified: Secondary | ICD-10-CM

## 2015-05-09 DIAGNOSIS — I69359 Hemiplegia and hemiparesis following cerebral infarction affecting unspecified side: Secondary | ICD-10-CM | POA: Diagnosis not present

## 2015-05-09 DIAGNOSIS — N179 Acute kidney failure, unspecified: Secondary | ICD-10-CM

## 2015-05-09 DIAGNOSIS — F32A Depression, unspecified: Secondary | ICD-10-CM

## 2015-05-09 NOTE — Progress Notes (Signed)
MRN: [id number] Name: Justin Sherman  Sex: male Age: 75 y.o. DOB: 1940/04/17  PSC #: Ronni Rumble Facility/Room:217B Level Of Care: SNF Provider: Merrilee Seashore D Emergency Contacts: Extended Emergency Contact Information Primary Emergency Contact: [place], [place], [place] [address] [place] of [place] Home Phone: [phone] Relation: Son Guardian: Vyron, [place] of [place] Home Phone: [phone] Mobile Phone: [phone] Relation: Legal Guardian  Code Status:   Allergies: Review of patient's allergies indicates no known allergies.  Chief Complaint  Patient presents with  . Medical Management of Chronic Issues    HPI: Patient is 75 y.o. male who is beoing seen for routine issues og CKD 4, HLD and depression.   Past Medical History  Diagnosis Date  . Alterations of sensations, late effect of cerebrovascular disease   . Acute conjunctivitis, unspecified   . Aortic aneurysm of unspecified site without mention of rupture   . Flaccid hemiplegia affecting dominant side (HCC)   . Type II or unspecified type diabetes mellitus without mention of complication, not stated as uncontrolled   . Depressive disorder, not elsewhere classified   . Unspecified essential hypertension   . Obstructive chronic bronchitis with exacerbation (HCC)   . Anxiety state, unspecified   . Esophageal reflux   . Other and unspecified hyperlipidemia   . Pressure ulcer, other site(707.09)   . Dementia   . Chronic kidney disease 08/04/2011    Acute renal failure per note 08/04/2011-Dr. Art Chilton Si  . Stroke (HCC)   . Type 2 diabetes mellitus with neurological manifestations, controlled [facility])     Past Surgical History  Procedure Laterality Date  . Cystoscopy w/ ureteral stent placement  08/07/2011    Procedure: CYSTOSCOPY WITH RETROGRADE PYELOGRAM/URETERAL STENT PLACEMENT;  Surgeon: Kathi Ludwig, MD;  [facility];  Service: Urology;  Laterality: Bilateral;   bilateral stent placement ureterosopy  . Cystoscopy/retrograde/ureteroscopy  12/07/2011    Procedure: CYSTOSCOPY/RETROGRADE/URETEROSCOPY;  Surgeon: Kathi Ludwig, MD;  [facility];  Service: Urology;  Laterality: Bilateral;  . Cystoscopy w/ ureteral stent placement  12/07/2011    Procedure: CYSTOSCOPY WITH STENT REPLACEMENT;  Surgeon: Kathi Ludwig, MD;  [facility];  Service: Urology;  Laterality: Bilateral;  (BIL) JJ STENT EXCHANGE  . Cystoscopy with urethral dilatation  12/07/2011    Procedure: CYSTOSCOPY WITH URETHRAL DILATATION;  Surgeon: Kathi Ludwig, MD;  [facility];  Service: Urology;;      Medication List       This list is accurate as of: 05/09/15 11:59 PM.  Always use your most recent med list.               acetaminophen 500 MG tablet  Commonly known as:  TYLENOL  Take 500 mg by mouth every 8 (eight) hours as needed (for pain).     albuterol (2.5 MG/3ML) 0.083% nebulizer solution  Commonly known as:  PROVENTIL  Take 2.5 mg by nebulization every 2 (two) hours as needed for wheezing.     atorvastatin 20 MG tablet  Commonly known as:  LIPITOR  Take 20 mg by mouth at bedtime. For hyperlipidemia     budesonide-formoterol 160-4.5 MCG/ACT inhaler  Commonly known as:  SYMBICORT  Inhale 2 puffs into the lungs 2 (two) times daily.     citalopram 20 MG tablet  Commonly known as:  CELEXA  Take 20 mg by mouth at bedtime. For depression     Cranberry 450 MG Caps  Take 450  mg by mouth 2 (two) times daily.     guaiFENesin 600 MG 12 hr tablet  Commonly known as:  MUCINEX  Take 600 mg by mouth 2 (two) times daily. For chronic bronchitis     insulin aspart 100 UNIT/ML injection  Commonly known as:  novoLOG  Inject 0-9 Units into the skin 3 (three) times daily with meals.     ipratropium-albuterol 0.5-2.5 (3) MG/3ML Soln  Commonly known as:  DUONEB  Take 3 mLs by nebulization every 6 (six) hours as needed. For shortness of breath      iron polysaccharides 150 MG capsule  Commonly known as:  NIFEREX  Take 1 capsule (150 mg total) by mouth daily.     multivitamins ther. w/minerals Tabs tablet  Take 1 tablet by mouth every morning.     polyethylene glycol packet  Commonly known as:  MIRALAX / GLYCOLAX  Take 17 g by mouth daily as needed for moderate constipation. For constipation     thiamine 100 MG tablet  Take 100 mg by mouth every morning.        No orders of the defined types were placed in this encounter.    Immunization History  Administered Date(s) Administered  . Influenza Whole 01/28/2013  . Influenza-Unspecified 01/18/2015  . Pneumococcal-Unspecified 04/30/2011    Social History  Substance Use Topics  . Smoking status: Never Smoker   . Smokeless tobacco: Not on file  . Alcohol Use: No    Review of Systems UTO 2/2 dementia; nursing without concerns     Filed Vitals:   05/09/15 1259  BP: 90/56  Pulse: 67  Temp: 97 F (36.1 C)  Resp: 17    Physical Exam  GENERAL APPEARANCE: Alert,min  conversant, No acute distress  SKIN: No diaphoresis rash HEENT: Unremarkable RESPIRATORY: Breathing is even, unlabored. Lung sounds are clear   CARDIOVASCULAR: Heart RRR no murmurs, rubs or gallops. No peripheral edema  GASTROINTESTINAL: Abdomen is soft, non-tender, not distended w/ normal bowel sounds.  GENITOURINARY: Bladder non tender, not distended  MUSCULOSKELETAL: No abnormal joints or musculature NEUROLOGIC: Cranial nerves 2-12 grossly intact; R hemiparesis PSYCHIATRIC: Mood and affect appropriate to situation wit dementia, no behavioral issues  Patient Active Problem List   Diagnosis Date Noted  . Acute urinary obstruction 04/07/2015  . 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 Anemia, chronic disease 04/07/2015  . Pressure ulcer 03/31/2015  . Uremia 03/31/2015  . Acute UTI 03/31/2015  . Acute encephalopathy 03/30/2015  . Hyperkalemia  03/30/2015  . Hyponatremia 03/30/2015  . End of life care 03/30/2015  . Dehydration   . 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 Acute renal failure superimposed on stage 4 chronic kidney disease (HCC) 08/08/2014  . Chronic constipation 07/03/2014  . Anemia in CKD (chronic kidney disease) 03/09/2013  . 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 Hyperlipidemia associated with type 2 diabetes mellitus (HCC) 08/18/2012  . COPD, moderate (HCC) 08/18/2012  . Depression 08/18/2012  . CKD (chronic kidney disease) stage 3, GFR 30-59 ml/min 08/17/2012  . Ureteral calculi 07/06/2012  . Urinary retention 08/04/2011  . Type 2 diabetes mellitus with neurological manifestations, controlled (HCC)     CBC    Component Value Date/Time   WBC 14.0* 04/04/2015 0605   WBC 10.8 07/02/2014   RBC 3.41* 04/04/2015 0605   HGB 8.7* 04/04/2015 0605   HCT 29.7* 04/04/2015 0605   PLT 262 04/04/2015  0605   MCV 87.1 04/04/2015 0605   LYMPHSABS 0.9 04/01/2015 0259   MONOABS 0.9 04/01/2015 0259   EOSABS 0.0 04/01/2015 0259   BASOSABS 0.0 04/01/2015 0259    CMP     Component Value Date/Time   NA 137 04/05/2015 0720   NA 145 07/02/2014   K 3.8 04/05/2015 0720   CL 104 04/05/2015 0720   CO2 26 04/05/2015 0720   GLUCOSE 103* 04/05/2015 0720   BUN 31* 04/05/2015 0720   BUN 42* 07/02/2014   CREATININE 2.36* 04/05/2015 0720   CREATININE 3.3* 07/02/2014   CALCIUM 8.8* 04/05/2015 0720   PROT 9.2* 03/30/2015 1345   ALBUMIN 1.7* 04/02/2015 0631   AST 26 03/30/2015 1345   ALT 16* 03/30/2015 1345   ALKPHOS 99 03/30/2015 1345   BILITOT 0.4 03/30/2015 1345   GFRNONAA 25* 04/05/2015 0720   GFRAA 29* 04/05/2015 0720    Assessment and Plan  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

## 2015-05-15 NOTE — Assessment & Plan Note (Signed)
Stable; cont lipitor 20 mg daily,last LDL was 73

## 2015-05-15 NOTE — Assessment & Plan Note (Signed)
Most recent GFR 21, Cr 2.7; will monitor

## 2015-05-15 NOTE — Assessment & Plan Note (Signed)
Stable, cnt celexa 20 mgo

## 2015-05-15 NOTE — Assessment & Plan Note (Signed)
Pt is stale;on no ASA or other prophylaxis

## 2015-05-16 ENCOUNTER — Non-Acute Institutional Stay (SKILLED_NURSING_FACILITY): Payer: Medicare Other | Admitting: Internal Medicine

## 2015-05-16 DIAGNOSIS — R627 Adult failure to thrive: Secondary | ICD-10-CM | POA: Diagnosis not present

## 2015-05-16 DIAGNOSIS — Z7189 Other specified counseling: Secondary | ICD-10-CM | POA: Diagnosis not present

## 2015-05-21 ENCOUNTER — Encounter: Payer: Self-pay | Admitting: Internal Medicine

## 2015-05-21 DIAGNOSIS — Z7189 Other specified counseling: Secondary | ICD-10-CM | POA: Insufficient documentation

## 2015-05-21 DIAGNOSIS — R627 Adult failure to thrive: Secondary | ICD-10-CM | POA: Insufficient documentation

## 2015-05-21 NOTE — Progress Notes (Signed)
MRN: [id number] Name: Justin Sherman  Sex: male Age: 75 y.o. DOB: [birth year]  PSC #: Ronni Rumble Facility/Room:217B Level Of Care: SNF Provider: Merrilee Seashore D Emergency Contacts: Extended Emergency Contact Information Primary Emergency Contact: Jahlil, [place], [place] [address] [place] of [place] Home Phone: [phone] Relation: Son Guardian: Jakylan, Ron [place] of [place] Home Phone: [phone] Mobile Phone: [phone] Relation: Legal Guardian  Code Status:   Allergies: Review of patient's allergies indicates no known allergies.  Chief Complaint  Patient presents with  . Acute Visit  . encounter for family conference    HPI: Patient is 75 y.o. male who nursing asked me to see for looking pale,clammy and SOB despite stable vital signs. Onset was today, just recently, no cold or cough or fever. Pt is chronically ill, endstage dementia, with DNR. Pt's POA was in the room when I went to see the pt.  Past Medical History  Diagnosis Date  . Alterations of sensations, late effect of cerebrovascular disease   . Acute conjunctivitis, unspecified   . Aortic aneurysm of unspecified site without mention of rupture   . Flaccid hemiplegia affecting dominant side (HCC)   . Type II or unspecified type diabetes mellitus without mention of complication, not stated as uncontrolled   . Depressive disorder, not elsewhere classified   . Unspecified essential hypertension   . Obstructive chronic bronchitis with exacerbation (HCC)   . Anxiety state, unspecified   . Esophageal reflux   . Other and unspecified hyperlipidemia   . Pressure ulcer, other site(707.09)   . Dementia   . Chronic kidney disease 08/04/2011    Acute renal failure per note 08/04/2011-Dr. Art Chilton Si  . Stroke (HCC)   . Type 2 diabetes mellitus with neurological manifestations, controlled [facility])     Past Surgical History  Procedure Laterality Date  . Cystoscopy w/ ureteral stent  placement  08/07/2011    Procedure: CYSTOSCOPY WITH RETROGRADE PYELOGRAM/URETERAL STENT PLACEMENT;  Surgeon: Kathi Ludwig, MD;  [facility];  Service: Urology;  Laterality: Bilateral;  bilateral stent placement ureterosopy  . Cystoscopy/retrograde/ureteroscopy  12/07/2011    Procedure: CYSTOSCOPY/RETROGRADE/URETEROSCOPY;  Surgeon: Kathi Ludwig, MD;  [facility];  Service: Urology;  Laterality: Bilateral;  . Cystoscopy w/ ureteral stent placement  12/07/2011    Procedure: CYSTOSCOPY WITH STENT REPLACEMENT;  Surgeon: Kathi Ludwig, MD;  [facility];  Service: Urology;  Laterality: Bilateral;  (BIL) JJ STENT EXCHANGE  . Cystoscopy with urethral dilatation  12/07/2011    Procedure: CYSTOSCOPY WITH URETHRAL DILATATION;  Surgeon: Kathi Ludwig, MD;  [facility];  Service: Urology;;      Medication List       This list is accurate as of: 05/16/15 11:59 PM.  Always use your most recent med list.               acetaminophen 500 MG tablet  Commonly known as:  TYLENOL  Take 500 mg by mouth every 8 (eight) hours as needed (for pain).     albuterol (2.5 MG/3ML) 0.083% nebulizer solution  Commonly known as:  PROVENTIL  Take 2.5 mg by nebulization every 2 (two) hours as needed for wheezing.     atorvastatin 20 MG tablet  Commonly known as:  LIPITOR  Take 20 mg by mouth at bedtime. For hyperlipidemia     budesonide-formoterol 160-4.5 MCG/ACT inhaler  Commonly known as:  SYMBICORT  Inhale 2 puffs into the lungs 2 (two) times  daily.     citalopram 20 MG tablet  Commonly known as:  CELEXA  Take 20 mg by mouth at bedtime. For depression     Cranberry 450 MG Caps  Take 450 mg by mouth 2 (two) times daily.     guaiFENesin 600 MG 12 hr tablet  Commonly known as:  MUCINEX  Take 600 mg by mouth 2 (two) times daily. For chronic bronchitis     insulin aspart 100 UNIT/ML injection  Commonly known as:  novoLOG  Inject 0-9 Units into the skin 3 (three)  times daily with meals.     ipratropium-albuterol 0.5-2.5 (3) MG/3ML Soln  Commonly known as:  DUONEB  Take 3 mLs by nebulization every 6 (six) hours as needed. For shortness of breath     iron polysaccharides 150 MG capsule  Commonly known as:  NIFEREX  Take 1 capsule (150 mg total) by mouth daily.     multivitamins ther. w/minerals Tabs tablet  Take 1 tablet by mouth every morning.     polyethylene glycol packet  Commonly known as:  MIRALAX / GLYCOLAX  Take 17 g by mouth daily as needed for moderate constipation. For constipation     thiamine 100 MG tablet  Take 100 mg by mouth every morning.        No orders of the defined types were placed in this encounter.    Immunization History  Administered Date(s) Administered  . Influenza Whole 01/28/2013  . Influenza-Unspecified 01/18/2015  . Pneumococcal-Unspecified 04/30/2011    Social History  Substance Use Topics  . Smoking status: Never Smoker   . Smokeless tobacco: Not on file  . Alcohol Use: No    Review of Systems  DATA OBTAINED: from patient, nurse, POA-sister-in-law GENERAL:  no fevers, fatigue, chronically poor appetite, very little poi intake SKIN: No itching, rash HEENT: No complaint RESPIRATORY: No cough, wheezing, SOB CARDIAC: No chest pain, palpitations, lower extremity edema  GI: No abdominal pain, No N/V/D or constipation, No heartburn or reflux  GU: No dysuria, frequency or urgency, or incontinence  MUSCULOSKELETAL: No unrelieved bone/joint pain NEUROLOGIC: No headache, dizziness  PSYCHIATRIC: No overt anxiety or sadness  Filed Vitals:   05/21/15 2012  BP: 141/78  Pulse: 83  Temp: 97.2 F (36.2 C)  Resp: 18    Physical Exam  GENERAL APPEARANCE: Alert, min conversant, No acute distress  SKIN: No diaphoresis rash HEENT: Unremarkable RESPIRATORY: Breathing is even, unlabored. Lung sounds are without rales ot wheezes CARDIOVASCULAR: Heart RRR no murmurs, rubs or gallops. No peripheral  edema  GASTROINTESTINAL: Abdomen is soft, non-tender, not distended w/ normal bowel sounds.  GENITOURINARY: Bladder non tender, not distended  MUSCULOSKELETAL: No abnormal joints, wasting NEUROLOGIC: Cranial nerves 2-12 grossly intact. Moves all extremities PSYCHIATRIC: flat, no behavioral issues  Patient Active Problem List   Diagnosis Date Noted  . Encounter for family conference with patient present 05/21/2015  . FTT (failure to thrive) in adult 05/21/2015  . Acute urinary obstruction 04/07/2015  . 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 Anemia, chronic disease 04/07/2015  . Pressure ulcer 03/31/2015  . Uremia 03/31/2015  . Acute UTI 03/31/2015  . Acute encephalopathy 03/30/2015  . Hyperkalemia 03/30/2015  . Hyponatremia 03/30/2015  . End of life care 03/30/2015  . Dehydration   . 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 Acute renal failure superimposed on stage 4 chronic kidney disease (HCC) 08/08/2014  . Chronic constipation 07/03/2014  . Anemia  in CKD (chronic kidney disease) 03/09/2013  . 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 Hyperlipidemia associated with type 2 diabetes mellitus (HCC) 08/18/2012  . COPD, moderate (HCC) 08/18/2012  . Depression 08/18/2012  . CKD (chronic kidney disease) stage 3, GFR 30-59 ml/min 08/17/2012  . Ureteral calculi 07/06/2012  . Urinary retention 08/04/2011  . Type 2 diabetes mellitus with neurological manifestations, controlled (HCC)     CBC    Component Value Date/Time   WBC 14.0* 04/04/2015 0605   WBC 10.8 07/02/2014   RBC 3.41* 04/04/2015 0605   HGB 8.7* 04/04/2015 0605   HCT 29.7* 04/04/2015 0605   PLT 262 04/04/2015 0605   MCV 87.1 04/04/2015 0605   LYMPHSABS 0.9 04/01/2015 0259   MONOABS 0.9 04/01/2015 0259   EOSABS 0.0 04/01/2015 0259   BASOSABS 0.0 04/01/2015 0259    CMP     Component Value  Date/Time   NA 137 04/05/2015 0720   NA 145 07/02/2014   K 3.8 04/05/2015 0720   CL 104 04/05/2015 0720   CO2 26 04/05/2015 0720   GLUCOSE 103* 04/05/2015 0720   BUN 31* 04/05/2015 0720   BUN 42* 07/02/2014   CREATININE 2.36* 04/05/2015 0720   CREATININE 3.3* 07/02/2014   CALCIUM 8.8* 04/05/2015 0720   PROT 9.2* 03/30/2015 1345   ALBUMIN 1.7* 04/02/2015 0631   AST 26 03/30/2015 1345   ALT 16* 03/30/2015 1345   ALKPHOS 99 03/30/2015 1345   BILITOT 0.4 03/30/2015 1345   GFRNONAA 25* 04/05/2015 0720   GFRAA 29* 04/05/2015 0720    Assessment and Plan  Encounter for family conference with patient present The pt's POA is his sister-in-law who has known him since they were children, second grade in fact. She knows he would not want to live like he is living or have his life prolonged. She would like comfort care only, no hospitalizations, as pt declines as she knows he will. She did this with her mother so she is well versed.  FTT (failure to thrive) in adult 2/2 vascular dementia;comfort careonly   Time spent > 35 min;> 50% of time with patient was spent reviewing records, labs, tests and studies, counseling and developing plan of care  Margit Hanks, MD

## 2015-05-21 NOTE — Assessment & Plan Note (Addendum)
The pt's POA is his sister-in-law who has known him since they were children, second grade in fact. She knows he would not want to live like he is living or have his life prolonged. She would like comfort care only, no hospitalizations, as pt declines as she knows he will. She did this with her mother so she is well versed.His body will need to go to [facility].

## 2015-05-21 NOTE — Assessment & Plan Note (Signed)
2/2 vascular dementia;comfort careonly

## 2015-06-07 ENCOUNTER — Encounter: Payer: Self-pay | Admitting: Adult Health

## 2015-06-08 ENCOUNTER — Non-Acute Institutional Stay (SKILLED_NURSING_FACILITY): Payer: Medicare Other | Admitting: Adult Health

## 2015-06-08 DIAGNOSIS — N201 Calculus of ureter: Secondary | ICD-10-CM | POA: Diagnosis not present

## 2015-06-08 DIAGNOSIS — N183 Chronic kidney disease, stage 3 unspecified: Secondary | ICD-10-CM

## 2015-06-08 DIAGNOSIS — E1149 Type 2 diabetes mellitus with other diabetic neurological complication: Secondary | ICD-10-CM

## 2015-06-08 DIAGNOSIS — I69320 Aphasia following cerebral infarction: Secondary | ICD-10-CM | POA: Diagnosis not present

## 2015-06-08 DIAGNOSIS — J449 Chronic obstructive pulmonary disease, unspecified: Secondary | ICD-10-CM | POA: Diagnosis not present

## 2015-06-08 DIAGNOSIS — D638 Anemia in other chronic diseases classified elsewhere: Secondary | ICD-10-CM

## 2015-06-08 DIAGNOSIS — F0150 Vascular dementia without behavioral disturbance: Secondary | ICD-10-CM

## 2015-06-08 DIAGNOSIS — R627 Adult failure to thrive: Secondary | ICD-10-CM

## 2015-06-08 DIAGNOSIS — I69359 Hemiplegia and hemiparesis following cerebral infarction affecting unspecified side: Secondary | ICD-10-CM

## 2015-06-08 NOTE — Progress Notes (Signed)
Patient ID: Justin Sherman, male   DOB: 1940/05/21, 75 y.o.   MRN: [id number]   Facility:  [facility]       No Known Allergies  Chief Complaint  Patient presents with  . Medical Management of Chronic Issues    Follow up    HPI:  He is a long term resident of this facility being seen for the management of his chronic illnesses. He continues to slowly decline; he is losing weight with his current weight at 134 pounds.  He is unable to participate in the hpi or ros; but did tell me that he is ok. There are no nursing concerns at this time  Past Medical History  Diagnosis Date  . Alterations of sensations, late effect of cerebrovascular disease   . Acute conjunctivitis, unspecified   . Aortic aneurysm of unspecified site without mention of rupture   . Flaccid hemiplegia affecting dominant side (HCC)   . Type II or unspecified type diabetes mellitus without mention of complication, not stated as uncontrolled   . Depressive disorder, not elsewhere classified   . Unspecified essential hypertension   . Obstructive chronic bronchitis with exacerbation (HCC)   . Anxiety state, unspecified   . Esophageal reflux   . Other and unspecified hyperlipidemia   . Pressure ulcer, other site(707.09)   . Dementia   . Chronic kidney disease 08/04/2011    Acute renal failure per note 08/04/2011-Dr. Art Chilton Si  . Stroke (HCC)   . Type 2 diabetes mellitus with neurological manifestations, controlled [facility])     Past Surgical History  Procedure Laterality Date  . Cystoscopy w/ ureteral stent placement  08/07/2011    Procedure: CYSTOSCOPY WITH RETROGRADE PYELOGRAM/URETERAL STENT PLACEMENT;  Surgeon: Kathi Ludwig, MD;  [facility];  Service: Urology;  Laterality: Bilateral;  bilateral stent placement ureterosopy  . Cystoscopy/retrograde/ureteroscopy  12/07/2011    Procedure: CYSTOSCOPY/RETROGRADE/URETEROSCOPY;  Surgeon: Kathi Ludwig, MD;  [facility];  Service: Urology;   Laterality: Bilateral;  . Cystoscopy w/ ureteral stent placement  12/07/2011    Procedure: CYSTOSCOPY WITH STENT REPLACEMENT;  Surgeon: Kathi Ludwig, MD;  [facility];  Service: Urology;  Laterality: Bilateral;  (BIL) JJ STENT EXCHANGE  . Cystoscopy with urethral dilatation  12/07/2011    Procedure: CYSTOSCOPY WITH URETHRAL DILATATION;  Surgeon: Kathi Ludwig, MD;  [facility];  Service: Urology;;    VITAL SIGNS BP 98/56 mmHg  Pulse 78  Temp(Src) 97 F (36.1 C) (Oral)  Resp 18  Ht 6' (1.829 m)  Wt 134 lb (60.782 kg)  BMI 18.17 kg/m2  SpO2 98%  Patient's Medications  New Prescriptions   No medications on file  Previous Medications   ACETAMINOPHEN (TYLENOL) 500 MG TABLET    Take 500 mg by mouth every 8 (eight) hours as needed (for pain). Reported on 06/08/2015   ALBUTEROL (PROVENTIL) (2.5 MG/3ML) 0.083% NEBULIZER SOLUTION    Take 2.5 mg by nebulization every 2 (two) hours as needed for wheezing. Reported on 06/08/2015   BUDESONIDE-FORMOTEROL (SYMBICORT) 160-4.5 MCG/ACT INHALER    Inhale 2 puffs into the lungs 2 (two) times daily.   CITALOPRAM (CELEXA) 20 MG TABLET    Take 20 mg by mouth at bedtime. For depression   CRANBERRY 450 MG CAPS    Take 450 mg by mouth 2 (two) times daily.   IPRATROPIUM-ALBUTEROL (DUONEB) 0.5-2.5 (3) MG/3ML SOLN    Take 3 mLs by nebulization every 6 (six) hours as needed. For shortness of breath  MORPHINE (ROXANOL) 20 MG/ML CONCENTRATED SOLUTION    5 mg every 4 (four) hours as needed for severe pain.   POLYETHYLENE GLYCOL (MIRALAX / GLYCOLAX) PACKET    Take 17 g by mouth daily as needed for moderate constipation. For constipation  Modified Medications   No medications on file  Discontinued Medications     SIGNIFICANT DIAGNOSTIC EXAMS  06-17-14: wbc 12.4; hgb 10.1; hct 35.4; mcv 88.2; plt 212; glucose 140; bun 33.4; creat 2.13; k+4.5; na++137; liver normal albumin 3.4; hgb a1c 7.6 07-02-14: wbc 21.8; hgb 10.8; hct 35.2; mcv 87.4; plt 200;  glucose 190; bun 41.8; creat 3.34; k+4.5; na++145 07-07-14: wbc 12.2; hgb 10.3; hct 33.6; mcv 85.9; plt 254; glucose 136; bun 46; creat 2.45; k+4.6; na++135 11-03-14: hgb a1c 7.4; chol 144; ldl 73; trig 193; hdl 32  04-14-08: wbc 10.8; hgb 9.3; hct 32.1; mcv 87.8 ;plt 227; glucose 194; bun 29.6; creat 2.12; k+ 4.6; na++137; liver normal albumin 3.4 hgb a1c 7.9  04-12-15: wbc 12.1; hgb 7.7; hct 25.4; mcv 83.8; plt 115; glucose 136; bun 22.3; creat 2.01; k+ 4.2; na++137     Review of Systems Unable to perform ROS: Dementia    Physical Exam Constitutional: No distress.  Frail  Eyes: Conjunctivae are normal.  Neck: Neck supple. No JVD present. No thyromegaly present.  Cardiovascular: Normal rate, regular rhythm and intact distal pulses.   Respiratory: Effort normal and breath sounds normal. No respiratory distress. He has no wheezes.  GI: Soft. Bowel sounds are normal. He exhibits no distension. There is no tenderness.  Musculoskeletal: He exhibits no edema.  Has right hemiparesis   Lymphadenopathy:    He has no cervical adenopathy.  Neurological: He is alert.  Skin: Skin is warm and dry. He is not diaphoretic.  Psychiatric: He has a normal mood and affect.       ASSESSMENT/ PLAN:  1. Copd: he is presently stable ; he does use 02; will continue symbicort 160/4.5 mcg 2 puffs twice daily;  has  duoneb every 6 hours as needed albuterol neb treatment every 2 hours as needed he does have roxanol 5 mg every 4 hours as needed  2. Dyslipidemia: ldl is 73 is not on medications   3. Anemia: hgb is 7.7; is presently not on medications will monitor   4. Diabetes: hgb a1c is 7.9 is presently not on medications; will monitor  5. Depression: is stable will continue celexa 20 mg daily   6. Constipation; will continue miralax daily   7. ckd stage III: no change in status most recent creat is 2.01     8. CVA; with right hemiparesis: is neurologically stable; will not make changes will monitor    9. Renal stone: no recent symptoms present; he has been seen by urology; will monitor his status.   10. Vascular dementia: the stage is advanced;  He is not presently on medications; will not make changes will monitor his status; his most recent weight is 134 pounds.   11. Protein calorie malnutrition: his albumin is 2.9. Will continue supplements per facility protocol. His most recent weight is 134 pounds.      Synthia Innocent NP [facility]  Contact [phone] Monday through Friday 8am- 5pm  After hours call [phone]

## 2015-07-01 NOTE — Progress Notes (Signed)
Patient ID: Justin Sherman, male   DOB: 08/28/1940, 75 y.o.   MRN: [id number]    DATE: 01/10/15  [facility]    Place of Service: SNF [phone](31)   Extended Emergency Contact Information Primary Emergency Contact: Justin Sherman,Justin          [place], [place] [address] [place] Home Phone: [phone] Relation: Son Guardian: Justin Sherman,Justin  [place] Home Phone: [phone] Mobile Phone: [phone] Relation: Legal Guardian  Advanced Directive information  FULL CODE; MOST FORM ON CHART  Chief Complaint  Patient presents with  . Medical Management of Chronic Issues    HPI:  75 yo male long term resident seen today for f/u. He has no c/o. He is a poor historian due to dementia. Hx obtained from chart. No nursing issues. No falls  Copd - stable ; he does use 02; uses advair 500/50 twice daily; mucinex twice daily  has albuterol neb every 2 hours as needed and duoneb every 6 hours as needed  Dyslipidemia stable on lipitor 20 mg daily. LDL 73   Anemia - stable Hgb 9.3. Takes iron daily  DM - uncontrolled. A1c 7.9%. Takes lantus 8 units daily; and novolog 5 units prior to meals for cbg >=150   Depression/anxiety/Dementia -  mood stable on celexa 20 mg daily. Albumin 2.9   Constipation - stable on miralax  ckd stage III stable. Cr 2.1  Hx CVA with right hemiparesis  - is neurologically stable   Past Medical History  Diagnosis Date  . Alterations of sensations, late effect of cerebrovascular disease   . Acute conjunctivitis, unspecified   . Aortic aneurysm of unspecified site without mention of rupture   . Flaccid hemiplegia affecting dominant side (HCC)   . Type II or unspecified type diabetes mellitus without mention of complication, not stated as uncontrolled   . Depressive disorder, not elsewhere classified   . Unspecified essential hypertension   . Obstructive chronic bronchitis with exacerbation (HCC)   . Anxiety  state, unspecified   . Esophageal reflux   . Other and unspecified hyperlipidemia   . Pressure ulcer, other site(707.09)   . Dementia   . Chronic kidney disease 08/04/2011    Acute renal failure per note 08/04/2011-Dr. Art Chilton SiGreen  . Stroke (HCC)   . Type 2 diabetes mellitus with neurological manifestations, controlled [facility](HCC)     Past Surgical History  Procedure Laterality Date  . Cystoscopy w/ ureteral stent placement  08/07/2011    Procedure: CYSTOSCOPY WITH RETROGRADE PYELOGRAM/URETERAL STENT PLACEMENT;  Surgeon: Kathi LudwigSigmund I Tannenbaum, MD;  Location: [facility];  Service: Urology;  Laterality: Bilateral;  bilateral stent placement ureterosopy  . Cystoscopy/retrograde/ureteroscopy  12/07/2011    Procedure: CYSTOSCOPY/RETROGRADE/URETEROSCOPY;  Surgeon: Kathi LudwigSigmund I Tannenbaum, MD;  Location: [facility];  Service: Urology;  Laterality: Bilateral;  . Cystoscopy w/ ureteral stent placement  12/07/2011    Procedure: CYSTOSCOPY WITH STENT REPLACEMENT;  Surgeon: Kathi LudwigSigmund I Tannenbaum, MD;  Location: [facility];  Service: Urology;  Laterality: Bilateral;  (BIL) JJ STENT EXCHANGE  . Cystoscopy with urethral dilatation  12/07/2011    Procedure: CYSTOSCOPY WITH URETHRAL DILATATION;  Surgeon: Kathi LudwigSigmund I Tannenbaum, MD;  Location: [facility];  Service: Urology;;    Patient Care Team: Margit HanksAnne D Alexander, MD as PCP - General (Internal Medicine) Sharee Holstereborah S Green, NP as Nurse Practitioner (Nurse Practitioner) [facility] (Skilled Nursing Facility)  Social History   Social History  . Marital Status: Married    Spouse  Name: N/A  . Number of Children: N/A  . Years of Education: N/A   Occupational History  . Not on file.   Social History Main Topics  . Smoking status: Never Smoker   . Smokeless tobacco: Not on file  . Alcohol Use: No  . Drug Use: No  . Sexual Activity: Not on file   Other Topics Concern  . Not on file   Social History Narrative     reports that he has never smoked. He does not  have any smokeless tobacco history on file. He reports that he does not drink alcohol or use illicit drugs.  Immunization History  Administered Date(s) Administered  . Influenza Whole 01/28/2013  . Influenza-Unspecified 01/18/2015  . Pneumococcal-Unspecified 04/30/2011, 03/09/2014    No Known Allergies  Medications: Patient's Medications  New Prescriptions   No medications on file  Previous Medications   ACETAMINOPHEN (TYLENOL) 500 MG TABLET    Take 500 mg by mouth every 8 (eight) hours as needed (for pain). Reported on 06/08/2015   ALBUTEROL (PROVENTIL) (2.5 MG/3ML) 0.083% NEBULIZER SOLUTION    Take 2.5 mg by nebulization every 2 (two) hours as needed for wheezing. Reported on 06/08/2015   BUDESONIDE-FORMOTEROL (SYMBICORT) 160-4.5 MCG/ACT INHALER    Inhale 2 puffs into the lungs 2 (two) times daily.   CITALOPRAM (CELEXA) 20 MG TABLET    Take 20 mg by mouth at bedtime. For depression   CRANBERRY 450 MG CAPS    Take 450 mg by mouth 2 (two) times daily.   IPRATROPIUM-ALBUTEROL (DUONEB) 0.5-2.5 (3) MG/3ML SOLN    Take 3 mLs by nebulization every 6 (six) hours as needed. For shortness of breath   MORPHINE (ROXANOL) 20 MG/ML CONCENTRATED SOLUTION    5 mg every 4 (four) hours as needed for severe pain.  Modified Medications   Modified Medication Previous Medication   POLYETHYLENE GLYCOL (MIRALAX / GLYCOLAX) PACKET polyethylene glycol (MIRALAX / GLYCOLAX) packet      Take 17 g by mouth daily as needed for moderate constipation. For constipation    Take 17 g by mouth every morning. For constipation  Discontinued Medications   ATORVASTATIN (LIPITOR) 20 MG TABLET    Take 20 mg by mouth at bedtime. Reported on 06/07/2015   FERROUS SULFATE 325 (65 FE) MG TABLET    Take 325 mg by mouth daily with breakfast. For anemia   FLUTICASONE-SALMETEROL (ADVAIR) 500-50 MCG/DOSE AEPB    Inhale 1 puff into the lungs 2 (two) times daily.   GUAIFENESIN (MUCINEX) 600 MG 12 HR TABLET    Take 600 mg by mouth 2 (two)  times daily. Reported on 06/08/2015   INSULIN ASPART (NOVOLOG) 100 UNIT/ML INJECTION    Inject 5 Units into the skin 4 (four) times daily -  before meals and at bedtime. Give for CBG > 150. Before meals and at bedtime   INSULIN GLARGINE (LANTUS) 100 UNIT/ML INJECTION    Inject 12 Units into the skin at bedtime. For DM   MULTIPLE VITAMINS-MINERALS (MULTIVITAMINS THER. W/MINERALS) TABS    Take 1 tablet by mouth every morning.    THIAMINE 100 MG TABLET    Take 100 mg by mouth every morning. Reported on 06/07/2015    Review of Systems  Unable to perform ROS: Dementia    Filed Vitals:   01/10/15 1248  BP: 145/72  Pulse: 76  Temp: 97 F (36.1 C)  SpO2: 97%   There is no weight on file to calculate BMI.  Physical Exam  Constitutional: He appears well-developed.  Sitting in w/c in NAD, frail appearing  HENT:  Mouth/Throat: Oropharynx is clear and moist.  Eyes: Pupils are equal, round, and reactive to light. No scleral icterus.  Neck: Neck supple. Carotid bruit is not present. No thyromegaly present.  Cardiovascular: Normal rate, regular rhythm and intact distal pulses.  Exam reveals no friction rub. Gallop: 1/6 SEM.   Murmur heard. no distal LE swelling. No calf TTP  Pulmonary/Chest: Effort normal and breath sounds normal. He has no wheezes. He has no rales. He exhibits no tenderness.  Abdominal: Soft. Bowel sounds are normal. He exhibits no distension, no abdominal bruit, no pulsatile midline mass and no mass. There is no tenderness. There is no rebound and no guarding.  Musculoskeletal: He exhibits edema.  Lymphadenopathy:    He has no cervical adenopathy.  Neurological: He is alert.  Right hemiparesis with flaccid paralysis  Skin: Skin is warm and dry. No rash noted.  Psychiatric: He has a normal mood and affect. His behavior is normal.     Labs reviewed: No visits with results within 3 Month(s) from this visit. Latest known visit with results is:  Nursing Home on 08/23/2014    Component Date Value Ref Range Status  . Hemoglobin 07/02/2014 10.8* 13.5 - 17.5 g/dL Final  . HCT 41/32/4401 35* 41 - 53 % Final  . Neutrophils Absolute 07/02/2014 19   Final  . Platelets 07/02/2014 200  150 - 399 K/L Final  . WBC 07/02/2014 10.8   Final  . Glucose 07/02/2014 190   Final  . BUN 07/02/2014 42* 4 - 21 mg/dL Final  . Creatinine 02/72/5366 3.3* 0.6 - 1.3 mg/dL Final  . Potassium 44/06/4740 4.5  3.4 - 5.3 mmol/L Final  . Sodium 07/02/2014 145  137 - 147 mmol/L Final  . Hgb A1c MFr Bld 06/17/2014 7.6* 4.0 - 6.0 % Final  . PSA 07/14/2014 1.78   Final    No results found.   Assessment/Plan   ICD-9-CM ICD-10-CM   1. Dementia, without behavioral disturbance 294.20 F03.90   2. Depression with anxiety 300.4 F41.8   3. DM (diabetes mellitus), type 2 with neurological complications (HCC)n- uncontrolled 250.60 E11.49   4. COPD, moderate (HCC) 496 J44.9   5. History of stroke with residual deficit 438.9 I69.30    right hemiparesis  6. 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 Hyperlipidemia LDL goal <100 272.4 E78.5   8. Gastroesophageal reflux disease without esophagitis 530.81 K21.9   9. Anemia in CKD (chronic kidney disease) 285.21 N18.9    585.9 D63.1     Increase lantus 12 units qHS  Cont other meds as ordered  Cont [place] O2 at 2L/min  Psych services following  CBC w diff in Nov 2016  May benefit from nutritional supplements  PT/OT/ST as indicated  Will follow  Shalana Jardin S. Ancil Linsey  [facility] [address] [place], [place] [address] [phone] Cell (Monday-Friday 8 AM - 5 PM) [phone] After 5 PM and follow prompts

## 2015-07-26 ENCOUNTER — Non-Acute Institutional Stay (SKILLED_NURSING_FACILITY): Payer: Medicare Other | Admitting: Adult Health

## 2015-07-26 ENCOUNTER — Encounter: Payer: Self-pay | Admitting: Adult Health

## 2015-07-26 DIAGNOSIS — N201 Calculus of ureter: Secondary | ICD-10-CM

## 2015-07-26 DIAGNOSIS — E1149 Type 2 diabetes mellitus with other diabetic neurological complication: Secondary | ICD-10-CM

## 2015-07-26 DIAGNOSIS — I69359 Hemiplegia and hemiparesis following cerebral infarction affecting unspecified side: Secondary | ICD-10-CM | POA: Diagnosis not present

## 2015-07-26 DIAGNOSIS — J449 Chronic obstructive pulmonary disease, unspecified: Secondary | ICD-10-CM

## 2015-07-26 DIAGNOSIS — N184 Chronic kidney disease, stage 4 (severe): Secondary | ICD-10-CM

## 2015-07-26 DIAGNOSIS — F0150 Vascular dementia without behavioral disturbance: Secondary | ICD-10-CM

## 2015-07-26 DIAGNOSIS — N189 Chronic kidney disease, unspecified: Secondary | ICD-10-CM

## 2015-07-26 DIAGNOSIS — N183 Chronic kidney disease, stage 3 unspecified: Secondary | ICD-10-CM

## 2015-07-26 DIAGNOSIS — N179 Acute kidney failure, unspecified: Secondary | ICD-10-CM

## 2015-07-26 DIAGNOSIS — D631 Anemia in chronic kidney disease: Secondary | ICD-10-CM

## 2015-07-26 DIAGNOSIS — I69320 Aphasia following cerebral infarction: Secondary | ICD-10-CM | POA: Diagnosis not present

## 2015-07-26 LAB — HM DIABETES EYE EXAM

## 2015-07-26 NOTE — Progress Notes (Signed)
Patient ID: Justin Sherman, male   DOB: [birth year], 75 y.o.   MRN: [id number]   Facility:  [facility]       No Known Allergies   Chief Complaint  Patient presents with  . Annual Exam     HPI:  He is a long term resident of this facility being seen for his annual exam. He has declined over the past year. He has not been hospitalized in the past several months. He is unable to fully participate in the hpi or ros. There are no nursing concerns at this time.   Past Medical History  Diagnosis Date  . Alterations of sensations, late effect of cerebrovascular disease   . Acute conjunctivitis, unspecified   . Aortic aneurysm of unspecified site without mention of rupture   . Flaccid hemiplegia affecting dominant side (HCC)   . Type II or unspecified type diabetes mellitus without mention of complication, not stated as uncontrolled   . Depressive disorder, not elsewhere classified   . Unspecified essential hypertension   . Obstructive chronic bronchitis with exacerbation (HCC)   . Anxiety state, unspecified   . Esophageal reflux   . Other and unspecified hyperlipidemia   . Pressure ulcer, other site(707.09)   . Dementia   . Chronic kidney disease 08/04/2011    Acute renal failure per note 08/04/2011-Dr. Art Chilton Si  . Stroke (HCC)   . Type 2 diabetes mellitus with neurological manifestations, controlled [facility])     Past Surgical History  Procedure Laterality Date  . Cystoscopy w/ ureteral stent placement  08/07/2011    Procedure: CYSTOSCOPY WITH RETROGRADE PYELOGRAM/URETERAL STENT PLACEMENT;  Surgeon: Kathi Ludwig, MD;  [facility]: [facility];  Service: Urology;  Laterality: Bilateral;  bilateral stent placement ureterosopy  . Cystoscopy/retrograde/ureteroscopy  12/07/2011    Procedure: CYSTOSCOPY/RETROGRADE/URETEROSCOPY;  Surgeon: Kathi Ludwig, MD;  [facility];  Service: Urology;  Laterality: Bilateral;  . Cystoscopy w/ ureteral stent placement  12/07/2011   Procedure: CYSTOSCOPY WITH STENT REPLACEMENT;  Surgeon: Kathi Ludwig, MD;  Location: [facility];  Service: Urology;  Laterality: Bilateral;  (BIL) JJ STENT EXCHANGE  . Cystoscopy with urethral dilatation  12/07/2011    Procedure: CYSTOSCOPY WITH URETHRAL DILATATION;  Surgeon: Kathi Ludwig, MD;  Location: [facility];  Service: Urology;;   Family History  Problem Relation Age of Onset  . Family history unknown: Yes    Social History   Social History  . Marital Status: Married    Spouse Name: N/A  . Number of Children: N/A  . Years of Education: N/A   Occupational History  . Not on file.   Social History Main Topics  . Smoking status: Never Smoker   . Smokeless tobacco: Not on file  . Alcohol Use: No  . Drug Use: No  . Sexual Activity: Not on file   Other Topics Concern  . Not on file   Social History Narrative    Immunization History  Administered Date(s) Administered  . Influenza Whole 01/28/2013  . Influenza-Unspecified 01/18/2015  . Pneumococcal-Unspecified 04/30/2011, 03/09/2014      VITAL SIGNS BP 130/74 mmHg  Pulse 70  Temp(Src) 97.8 F (36.6 C) (Oral)  Resp 18  Ht  (1.702 m)  Wt 138 lb 8 oz (62.823 kg)  BMI 21.69 kg/m2  SpO2 98%  Patient's Medications  New Prescriptions   No medications on file  Previous Medications   ALBUTEROL (PROVENTIL) (2.5 MG/3ML) 0.083% NEBULIZER SOLUTION    Take 2.5 mg by nebulization every 2 (  two) hours as needed for wheezing. Reported on 06/08/2015   BUDESONIDE-FORMOTEROL (SYMBICORT) 160-4.5 MCG/ACT INHALER    Inhale 2 puffs into the lungs 2 (two) times daily.   CITALOPRAM (CELEXA) 20 MG TABLET    Take 20 mg by mouth at bedtime. For depression   CRANBERRY 450 MG CAPS    Take 450 mg by mouth 2 (two) times daily.   GUAIFENESIN (MUCINEX) 600 MG 12 HR TABLET    Take 600 mg by mouth 2 (two) times daily.   IPRATROPIUM-ALBUTEROL (DUONEB) 0.5-2.5 (3) MG/3ML SOLN    Take 3 mLs by nebulization every 6 (six) hours as needed.  For shortness of breath   MORPHINE (ROXANOL) 20 MG/ML CONCENTRATED SOLUTION    5 mg every 4 (four) hours as needed for severe pain.   POLYETHYLENE GLYCOL (MIRALAX / GLYCOLAX) PACKET    Take 17 g by mouth daily as needed for moderate constipation. For constipation  Modified Medications   No medications on file  Discontinued Medications   ACETAMINOPHEN (TYLENOL) 500 MG TABLET    Take 500 mg by mouth every 8 (eight) hours as needed (for pain). Reported on 07/26/2015     SIGNIFICANT DIAGNOSTIC EXAMS  11-03-14: hgb a1c 7.4; chol 144; ldl 73; trig 193; hdl 32  2-9-569-2-16: wbc 10.8; hgb 9.3; hct 32.1; mcv 87.8 ;plt 227; glucose 194; bun 29.6; creat 2.12; k+ 4.6; na++137; liver normal albumin 3.4 hgb a1c 7.9  04-12-15: wbc 12.1; hgb 7.7; hct 25.4; mcv 83.8; plt 115; glucose 136; bun 22.3; creat 2.01; k+ 4.2; na++137     Review of Systems Unable to perform ROS: Dementia    Physical Exam Constitutional: No distress.  Frail  Eyes: Conjunctivae are normal.  Neck: Neck supple. No JVD present. No thyromegaly present.  Cardiovascular: Normal rate, regular rhythm and intact distal pulses.   Respiratory: Effort normal and breath sounds normal. No respiratory distress. He has no wheezes.  GI: Soft. Bowel sounds are normal. He exhibits no distension. There is no tenderness.  Musculoskeletal: He exhibits no edema.  Has right hemiparesis   Lymphadenopathy:    He has no cervical adenopathy.  Neurological: He is alert.  Skin: Skin is warm and dry. He is not diaphoretic.  Psychiatric: He has a normal mood and affect.       ASSESSMENT/ PLAN:  1. Copd: he is presently stable ; he does use 02; will continue symbicort 160/4.5 mcg 2 puffs twice daily;  has  duoneb every 6 hours as needed albuterol neb treatment every 2 hours as needed he does have roxanol 5 mg every 4 hours as needed  2. Dyslipidemia: ldl is 73 is not on medications   3. Anemia: hgb is 7.7; is presently not on medications will monitor    4. Diabetes: hgb a1c is 7.9 is presently not on medications; will monitor  5. Depression: is stable will continue celexa 20 mg daily   6. Constipation; will continue miralax daily   7. ckd stage III: no change in status most recent creat is 2.01     8. 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 Renal stone: no recent symptoms present; will monitor his status.   10. 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   11. Protein calorie malnutrition: his albumin is 2.9. Will continue supplements per facility protocol.  His most recent weight is 134 pounds.      His health maintenance is up to date.     Time spent with patient 45    minutes >50% time spent counseling; reviewing medical record; tests; labs; and developing future plan of care      Synthia Innocent NP [facility]  Contact [phone] Monday through Friday 8am- 5pm  After hours call [phone]

## 2015-07-28 LAB — CBC AND DIFFERENTIAL
HEMATOCRIT: 30 % — AB (ref 41–53)
HEMOGLOBIN: 9.6 g/dL — AB (ref 13.5–17.5)
Platelets: 179 10*3/uL (ref 150–399)
WBC: 5.9 10*3/mL

## 2015-07-28 LAB — BASIC METABOLIC PANEL
BUN: 33 mg/dL — AB (ref 4–21)
Creatinine: 2.5 mg/dL — AB (ref 0.6–1.3)
GLUCOSE: 183 mg/dL
POTASSIUM: 3.9 mmol/L (ref 3.4–5.3)
Sodium: 137 mmol/L (ref 137–147)

## 2015-07-28 LAB — LIPID PANEL
Cholesterol: 129 mg/dL (ref 0–200)
HDL: 34 mg/dL — AB (ref 35–70)
LDL CALC: 61 mg/dL
Triglycerides: 171 mg/dL — AB (ref 40–160)

## 2015-07-28 LAB — HEMOGLOBIN A1C: HEMOGLOBIN A1C: 6.4

## 2015-07-28 LAB — HEPATIC FUNCTION PANEL
ALK PHOS: 66 U/L (ref 25–125)
AST: 10 U/L — AB (ref 14–40)

## 2015-08-09 LAB — HM DIABETES FOOT EXAM

## 2015-08-22 ENCOUNTER — Encounter: Payer: Self-pay | Admitting: Internal Medicine

## 2015-08-22 ENCOUNTER — Non-Acute Institutional Stay (SKILLED_NURSING_FACILITY): Payer: Medicare Other | Admitting: Internal Medicine

## 2015-08-22 DIAGNOSIS — D638 Anemia in other chronic diseases classified elsewhere: Secondary | ICD-10-CM

## 2015-08-22 DIAGNOSIS — E1149 Type 2 diabetes mellitus with other diabetic neurological complication: Secondary | ICD-10-CM

## 2015-08-22 DIAGNOSIS — J449 Chronic obstructive pulmonary disease, unspecified: Secondary | ICD-10-CM | POA: Diagnosis not present

## 2015-08-22 NOTE — Assessment & Plan Note (Signed)
presently stable ; he does use 02; will continue symbicort 160/4.5 mcg 2 puffs twice daily; has duoneb every 6 hours as needed albuterol neb treatment every 2 hours as needed he does have roxanol 5 mg every 4 hours as needed

## 2015-08-22 NOTE — Assessment & Plan Note (Signed)
a1c is 7.9 is presently not on medications; will monitor

## 2015-08-22 NOTE — Assessment & Plan Note (Signed)
hgb is 7.7; is presently not on medications will monitor

## 2015-08-22 NOTE — Progress Notes (Signed)
MRN: [id number] Name: Justin Sherman  Sex: male Age: 75 y.o. DOB: February 03, 1941  PSC #: Ronni Rumble Facility/Room: [address] Level Of Care: SNF Provider: Margit Hanks Emergency Contacts: Extended Emergency Contact Information Primary Emergency Contact: Franki, [place], [place] [address] [place] Home Phone: [phone] Relation: Son Guardian: Cayne, [place] Home Phone: [phone] Mobile Phone: [phone] Relation: Legal Guardian  Code Status: DNR  Allergies: Review of patient's allergies indicates no known allergies.  Chief Complaint  Patient presents with  . Medical Management of Chronic Issues    Routine Visit    HPI: Patient is 75 y.o. male who is being seen for routine issues of COPD, anemia and DM2.  Past Medical History  Diagnosis Date  . Alterations of sensations, late effect of cerebrovascular disease   . Acute conjunctivitis, unspecified   . Aortic aneurysm of unspecified site without mention of rupture   . Flaccid hemiplegia affecting dominant side (HCC)   . Type II or unspecified type diabetes mellitus without mention of complication, not stated as uncontrolled   . Depressive disorder, not elsewhere classified   . Unspecified essential hypertension   . Obstructive chronic bronchitis with exacerbation (HCC)   . Anxiety state, unspecified   . Esophageal reflux   . Other and unspecified hyperlipidemia   . Pressure ulcer, other site(707.09)   . Dementia   . Chronic kidney disease 08/04/2011    Acute renal failure per note 08/04/2011-Dr. Art Chilton Si  . Stroke (HCC)   . Type 2 diabetes mellitus with neurological manifestations, controlled [facility])     Past Surgical History  Procedure Laterality Date  . Cystoscopy w/ ureteral stent placement  08/07/2011    Procedure: CYSTOSCOPY WITH RETROGRADE PYELOGRAM/URETERAL STENT PLACEMENT;  Surgeon: Kathi Ludwig, MD;  [facility]: [facility];  Service: Urology;   Laterality: Bilateral;  bilateral stent placement ureterosopy  . Cystoscopy/retrograde/ureteroscopy  12/07/2011    Procedure: CYSTOSCOPY/RETROGRADE/URETEROSCOPY;  Surgeon: Kathi Ludwig, MD;  [facility];  Service: Urology;  Laterality: Bilateral;  . Cystoscopy w/ ureteral stent placement  12/07/2011    Procedure: CYSTOSCOPY WITH STENT REPLACEMENT;  Surgeon: Kathi Ludwig, MD;  [facility];  Service: Urology;  Laterality: Bilateral;  (BIL) JJ STENT EXCHANGE  . Cystoscopy with urethral dilatation  12/07/2011    Procedure: CYSTOSCOPY WITH URETHRAL DILATATION;  Surgeon: Kathi Ludwig, MD;  [facility];  Service: Urology;;      Medication List       This list is accurate as of: 08/22/15  9:55 PM.  Always use your most recent med list.               albuterol (2.5 MG/3ML) 0.083% nebulizer solution  Commonly known as:  PROVENTIL  Take 2.5 mg by nebulization every 2 (two) hours as needed for wheezing. Reported on 06/08/2015     budesonide-formoterol 160-4.5 MCG/ACT inhaler  Commonly known as:  SYMBICORT  Inhale 2 puffs into the lungs 2 (two) times daily.     citalopram 20 MG tablet  Commonly known as:  CELEXA  Take 20 mg by mouth at bedtime. For depression     Cranberry 450 MG Caps  Take 450 mg by mouth 2 (two) times daily.     guaiFENesin 600 MG 12 hr tablet  Commonly known as:  MUCINEX  Take 600 mg by mouth 2 (two) times daily.     ipratropium-albuterol 0.5-2.5 (3) MG/3ML Soln  Commonly known as:  DUONEB  Take 3 mLs by nebulization every 6 (six) hours as needed. For shortness of breath     morphine 20 MG/ML concentrated solution  Commonly known as:  ROXANOL  5 mg every 4 (four) hours as needed for severe pain.     polyethylene glycol packet  Commonly known as:  MIRALAX / GLYCOLAX  Take 17 g by mouth daily as needed for moderate constipation. For constipation        No orders of the defined types were placed in this encounter.     Immunization History  Administered Date(s) Administered  . Influenza Whole 01/28/2013  . Influenza-Unspecified 01/18/2015  . Pneumococcal-Unspecified 04/30/2011, 03/09/2014    Social History  Substance Use Topics  . Smoking status: Never Smoker   . Smokeless tobacco: Not on file  . Alcohol Use: No    Review of Systems  UT fully participate 2/2 dementia    Filed Vitals:   08/22/15 1616  BP: 146/74  Pulse: 66  Temp: 97.6 F (36.4 C)  Resp: 18    Physical Exam  GENERAL APPEARANCE: Alert, conversant, No acute distress, frail WM  SKIN: No diaphoresis rash HEENT: Unremarkable RESPIRATORY: Breathing is even, unlabored. Lung sounds are clear   CARDIOVASCULAR: Heart RRR no murmurs, rubs or gallops. No peripheral edema  GASTROINTESTINAL: Abdomen is soft, non-tender, not distended w/ normal bowel sounds.  GENITOURINARY: Bladder non tender, not distended  MUSCULOSKELETAL: wasting NEUROLOGIC: Cranial nerves 2-12 grossly intact; R side hemipareiss PSYCHIATRIC: Mood and affect appropriate to situation, no behavioral issues  Patient Active Problem List   Diagnosis Date Noted  . FTT (failure to thrive) in adult 05/21/2015  . Anemia, chronic disease 04/07/2015  . Acute renal failure superimposed on stage 4 chronic kidney disease (HCC) 08/08/2014  . Chronic constipation 07/03/2014  . Anemia in CKD (chronic kidney disease) 03/09/2013  . 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 Hyperlipidemia associated with type 2 diabetes mellitus (HCC) 08/18/2012  . COPD, moderate (HCC) 08/18/2012  . Depression 08/18/2012  . Ureteral calculi 07/06/2012  . Type 2 diabetes mellitus with neurological manifestations, controlled (HCC)     CBC    Component Value Date/Time   WBC 5.9 07/28/2015   WBC 14.0* 04/04/2015 0605   RBC 3.41* 04/04/2015 0605   HGB 9.6* 07/28/2015   HCT 30* 07/28/2015   PLT 179 07/28/2015   MCV 87.1 04/04/2015 0605    LYMPHSABS 0.9 04/01/2015 0259   MONOABS 0.9 04/01/2015 0259   EOSABS 0.0 04/01/2015 0259   BASOSABS 0.0 04/01/2015 0259    CMP     Component Value Date/Time   NA 137 07/28/2015   NA 137 04/05/2015 0720   K 3.9 07/28/2015   CL 104 04/05/2015 0720   CO2 26 04/05/2015 0720   GLUCOSE 103* 04/05/2015 0720   BUN 33* 07/28/2015   BUN 31* 04/05/2015 0720   CREATININE 2.5* 07/28/2015   CREATININE 2.36* 04/05/2015 0720   CALCIUM 8.8* 04/05/2015 0720   PROT 9.2* 03/30/2015 1345   ALBUMIN 1.7* 04/02/2015 0631   AST 10* 07/28/2015   ALT 16* 03/30/2015 1345   ALKPHOS 66 07/28/2015   BILITOT 0.4 03/30/2015 1345   GFRNONAA 25* 04/05/2015 0720   GFRAA 29* 04/05/2015 0720    Assessment and Plan  COPD, moderate presently stable ; he does use 02; will continue symbicort 160/4.5 mcg 2 puffs twice daily; has duoneb every 6 hours as needed albuterol neb treatment every  2 hours as needed he does have roxanol 5 mg every 4 hours as needed  Anemia, chronic disease hgb is 7.7; is presently not on medications will monitor   Type 2 diabetes mellitus with neurological manifestations, controlled (HCC) a1c is 7.9 is presently not on medications; will monitor    Margit Hanks, MD

## 2015-08-22 NOTE — Progress Notes (Signed)
This encounter was created in error - please disregard.

## 2015-08-27 NOTE — Progress Notes (Signed)
This encounter was created in error - please disregard.

## 2015-09-21 ENCOUNTER — Non-Acute Institutional Stay (SKILLED_NURSING_FACILITY): Payer: Medicare Other | Admitting: Adult Health

## 2015-09-21 ENCOUNTER — Encounter: Payer: Self-pay | Admitting: Adult Health

## 2015-09-21 DIAGNOSIS — N189 Chronic kidney disease, unspecified: Secondary | ICD-10-CM

## 2015-09-21 DIAGNOSIS — E1169 Type 2 diabetes mellitus with other specified complication: Secondary | ICD-10-CM | POA: Diagnosis not present

## 2015-09-21 DIAGNOSIS — R627 Adult failure to thrive: Secondary | ICD-10-CM | POA: Diagnosis not present

## 2015-09-21 DIAGNOSIS — N184 Chronic kidney disease, stage 4 (severe): Secondary | ICD-10-CM | POA: Diagnosis not present

## 2015-09-21 DIAGNOSIS — J449 Chronic obstructive pulmonary disease, unspecified: Secondary | ICD-10-CM

## 2015-09-21 DIAGNOSIS — K5900 Constipation, unspecified: Secondary | ICD-10-CM | POA: Diagnosis not present

## 2015-09-21 DIAGNOSIS — N179 Acute kidney failure, unspecified: Secondary | ICD-10-CM | POA: Diagnosis not present

## 2015-09-21 DIAGNOSIS — E785 Hyperlipidemia, unspecified: Secondary | ICD-10-CM | POA: Diagnosis not present

## 2015-09-21 DIAGNOSIS — E1149 Type 2 diabetes mellitus with other diabetic neurological complication: Secondary | ICD-10-CM | POA: Diagnosis not present

## 2015-09-21 DIAGNOSIS — I69320 Aphasia following cerebral infarction: Secondary | ICD-10-CM

## 2015-09-21 DIAGNOSIS — F0150 Vascular dementia without behavioral disturbance: Secondary | ICD-10-CM

## 2015-09-21 DIAGNOSIS — D631 Anemia in chronic kidney disease: Secondary | ICD-10-CM

## 2015-09-21 DIAGNOSIS — I69359 Hemiplegia and hemiparesis following cerebral infarction affecting unspecified side: Secondary | ICD-10-CM | POA: Diagnosis not present

## 2015-09-21 DIAGNOSIS — K5909 Other constipation: Secondary | ICD-10-CM

## 2015-09-21 NOTE — Progress Notes (Signed)
Patient ID: Justin Sherman, male   DOB: 01-09-1941, 75 y.o.   MRN: [id number]   Location:  [facility] Room Number: 217-B Place of Service:  SNF (31)   CODE STATUS: DNR  No Known Allergies  Chief Complaint  Patient presents with  . Medical Management of Chronic Issues    Follow up    HPI:  He is a long term resident of this facility being seen for the management of his chronic illnesses. Overall there is little change in his status. He is unable to fully participate in the hpi or ros; he did me that he felt good. There are no nursing concerns at this time.    Past Medical History  Diagnosis Date  . Alterations of sensations, late effect of cerebrovascular disease   . Acute conjunctivitis, unspecified   . Aortic aneurysm of unspecified site without mention of rupture   . Flaccid hemiplegia affecting dominant side (HCC)   . Type II or unspecified type diabetes mellitus without mention of complication, not stated as uncontrolled   . Depressive disorder, not elsewhere classified   . Unspecified essential hypertension   . Obstructive chronic bronchitis with exacerbation (HCC)   . Anxiety state, unspecified   . Esophageal reflux   . Other and unspecified hyperlipidemia   . Pressure ulcer, other site(707.09)   . Dementia   . Chronic kidney disease 08/04/2011    Acute renal failure per note 08/04/2011-Dr. Art Chilton Si  . Stroke (HCC)   . Type 2 diabetes mellitus with neurological manifestations, controlled [facility])     Past Surgical History  Procedure Laterality Date  . Cystoscopy w/ ureteral stent placement  08/07/2011    Procedure: CYSTOSCOPY WITH RETROGRADE PYELOGRAM/URETERAL STENT PLACEMENT;  Surgeon: Kathi Ludwig, MD;  Location: [facility];  Service: Urology;  Laterality: Bilateral;  bilateral stent placement ureterosopy  . Cystoscopy/retrograde/ureteroscopy  12/07/2011    Procedure: CYSTOSCOPY/RETROGRADE/URETEROSCOPY;  Surgeon: Kathi Ludwig, MD;  Location: [facility];  Service: Urology;  Laterality: Bilateral;  . Cystoscopy w/ ureteral stent placement  12/07/2011    Procedure: CYSTOSCOPY WITH STENT REPLACEMENT;  Surgeon: Kathi Ludwig, MD;  Location: [facility];  Service: Urology;  Laterality: Bilateral;  (BIL) JJ STENT EXCHANGE  . Cystoscopy with urethral dilatation  12/07/2011    Procedure: CYSTOSCOPY WITH URETHRAL DILATATION;  Surgeon: Kathi Ludwig, MD;  Location: [facility];  Service: Urology;;    Social History   Social History  . Marital Status: Married    Spouse Name: N/A  . Number of Children: N/A  . Years of Education: N/A   Occupational History  . Not on file.   Social History Main Topics  . Smoking status: Never Smoker   . Smokeless tobacco: Not on file  . Alcohol Use: No  . Drug Use: No  . Sexual Activity: Not on file   Other Topics Concern  . Not on file   Social History Narrative   Family History  Problem Relation Age of Onset  . Family history unknown: Yes      VITAL SIGNS BP 138/67 mmHg  Pulse 74  Temp(Src) 97.6 F (36.4 C) (Oral)  Resp 19  Ht 5\' 7"  (1.702 m)  Wt 132 lb (59.875 kg)  BMI 20.67 kg/m2  SpO2 96%  Patient's Medications  New Prescriptions   No medications on file  Previous Medications   ALBUTEROL (PROVENTIL) (2.5 MG/3ML) 0.083% NEBULIZER SOLUTION    Take 2.5 mg by nebulization every 2 (two)  hours as needed for wheezing. Reported on 06/08/2015   BUDESONIDE-FORMOTEROL (SYMBICORT) 160-4.5 MCG/ACT INHALER    Inhale 2 puffs into the lungs 2 (two) times daily.   CITALOPRAM (CELEXA) 20 MG TABLET    Take 20 mg by mouth at bedtime. For depression   CRANBERRY 450 MG CAPS    Take 450 mg by mouth 2 (two) times daily.   GUAIFENESIN (MUCINEX) 600 MG 12 HR TABLET    Take 600 mg by mouth 2 (two) times daily.   IPRATROPIUM-ALBUTEROL (DUONEB) 0.5-2.5 (3) MG/3ML SOLN    Take 3 mLs by nebulization every 6 (six) hours as needed. For shortness of breath   MORPHINE (ROXANOL) 20  MG/ML CONCENTRATED SOLUTION    5 mg every 4 (four) hours as needed for severe pain.   POLYETHYLENE GLYCOL (MIRALAX / GLYCOLAX) PACKET    Take 17 g by mouth daily as needed for moderate constipation. For constipation  Modified Medications   No medications on file  Discontinued Medications   No medications on file     SIGNIFICANT DIAGNOSTIC EXAMS  11-03-14: hgb a1c 7.4; chol 144; ldl 73; trig 193; hdl 32  4-0-98: wbc 10.8; hgb 9.3; hct 32.1; mcv 87.8 ;plt 227; glucose 194; bun 29.6; creat 2.12; k+ 4.6; na++137; liver normal albumin 3.4 hgb a1c 7.9  04-12-15: wbc 12.1; hgb 7.7; hct 25.4; mcv 83.8; plt 115; glucose 136; bun 22.3; creat 2.01; k+ 4.2; na++137  07-28-15; wbc 5.9; hgb 9.6; hct 29.6; mcv 82.3; plt 179; glucose 184; bun 33.2; creat 2.54; k+ 3.9; na++ 137; liver normal albumin 2.9; hgb a1c 6.4  Chol 129; ldl 61; trig 171; hdl 34     Review of Systems Unable to perform ROS: Dementia    Physical Exam Constitutional: No distress.  Frail  Eyes: Conjunctivae are normal.  Neck: Neck supple. No JVD present. No thyromegaly present.  Cardiovascular: Normal rate, regular rhythm and intact distal pulses.   Respiratory: Effort normal and breath sounds normal. No respiratory distress. He has no wheezes.  GI: Soft. Bowel sounds are normal. He exhibits no distension. There is no tenderness.  Musculoskeletal: He exhibits no edema.  Has right hemiparesis   Lymphadenopathy:    He has no cervical adenopathy.  Neurological: He is alert.  Skin: Skin is warm and dry. He is not diaphoretic.  Psychiatric: He has a normal mood and affect.       ASSESSMENT/ PLAN:  1. Copd: he is presently stable ; he does use 02; will continue symbicort 160/4.5 mcg 2 puffs twice daily;  has  duoneb every 6 hours as needed albuterol neb treatment every 2 hours as needed he does have roxanol 5 mg every 4 hours as needed  2. Dyslipidemia: ldl is 73 is not on medications   3. Anemia: hgb is 9.6; is presently not  on medications will monitor   4. Diabetes: hgb a1c is 6.4 is presently not on medications; will monitor  5. Depression: is stable will continue celexa 20 mg daily   6. Constipation; will continue miralax daily   7. ckd stage III: no change in status bun/creat 33.2/2.54    8. 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 Renal stone: no recent symptoms present; will monitor his status.   10. Vascular dementia: the stage is advanced;  He is not presently on medications; will not make changes will monitor his status; his most recent weight is 132 pounds.   11.  Protein calorie malnutrition: his albumin is 2.9. Will continue supplements per facility protocol. His most recent weight is 132 pounds.      Synthia Innocenteborah Vesper Trant NP [facility]  Contact [phone] Monday through Friday 8am- 5pm  After hours call [phone]

## 2015-10-06 DIAGNOSIS — F0150 Vascular dementia without behavioral disturbance: Secondary | ICD-10-CM | POA: Insufficient documentation

## 2015-10-19 ENCOUNTER — Encounter: Payer: Self-pay | Admitting: Adult Health

## 2015-10-19 ENCOUNTER — Non-Acute Institutional Stay (SKILLED_NURSING_FACILITY): Payer: Medicare Other | Admitting: Adult Health

## 2015-10-19 DIAGNOSIS — E1149 Type 2 diabetes mellitus with other diabetic neurological complication: Secondary | ICD-10-CM

## 2015-10-19 LAB — CBC AND DIFFERENTIAL
HCT: 25 % — AB (ref 41–53)
Hemoglobin: 8.2 g/dL — AB (ref 13.5–17.5)
PLATELETS: 263 10*3/uL (ref 150–399)
WBC: 8.3 10^3/mL

## 2015-10-19 LAB — HEPATIC FUNCTION PANEL
ALK PHOS: 60 U/L (ref 25–125)
ALT: 5 U/L — AB (ref 10–40)
AST: 5 U/L — AB (ref 14–40)
Bilirubin, Total: 0.2 mg/dL

## 2015-10-19 LAB — BASIC METABOLIC PANEL
BUN: 55 mg/dL — AB (ref 4–21)
CREATININE: 4.5 mg/dL — AB (ref 0.6–1.3)
Glucose: 89 mg/dL
Potassium: 5.2 mmol/L (ref 3.4–5.3)
SODIUM: 135 mmol/L — AB (ref 137–147)

## 2015-10-19 LAB — MICROALBUMIN/CREATININE RATIO, UR
CREATININE, URINE: 34
MICROALB UR: 19.1
Microalb/Creat Ratio: 556.6

## 2015-10-19 LAB — HEMOGLOBIN A1C: HEMOGLOBIN A1C: 6.4

## 2015-10-19 NOTE — Progress Notes (Signed)
Patient ID: Justin Sherman, male   DOB: 1940/12/07, 75 y.o.   MRN: [id number]    Location:   [facility] Room Number: 217-B Place of Service:  SNF (31)   CODE STATUS: DNR  No Known Allergies  Chief Complaint  Patient presents with  . Acute Visit    Diabetes    HPI:  I have been asked to see him regarding is diabetes. He does not take medications for his disease process; and has an hgb a1c of 6.4. He is not on a statin or ace or asa due to his advanced age and his advanced disease states. He would not benefit from any further treatment at this time. He is unable to participate in the hpi or ros.    Past Medical History  Diagnosis Date  . Alterations of sensations, late effect of cerebrovascular disease   . Acute conjunctivitis, unspecified   . Aortic aneurysm of unspecified site without mention of rupture   . Flaccid hemiplegia affecting dominant side (HCC)   . Type II or unspecified type diabetes mellitus without mention of complication, not stated as uncontrolled   . Depressive disorder, not elsewhere classified   . Unspecified essential hypertension   . Obstructive chronic bronchitis with exacerbation (HCC)   . Anxiety state, unspecified   . Esophageal reflux   . Other and unspecified hyperlipidemia   . Pressure ulcer, other site(707.09)   . Dementia   . Chronic kidney disease 08/04/2011    Acute renal failure per note 08/04/2011-Dr. Art Chilton Si  . Stroke (HCC)   . Type 2 diabetes mellitus with neurological manifestations, controlled [facility])     Past Surgical History  Procedure Laterality Date  . Cystoscopy w/ ureteral stent placement  08/07/2011    Procedure: CYSTOSCOPY WITH RETROGRADE PYELOGRAM/URETERAL STENT PLACEMENT;  Surgeon: Kathi Ludwig, MD;  Location: [facility];  Service: Urology;  Laterality: Bilateral;  bilateral stent placement ureterosopy  . Cystoscopy/retrograde/ureteroscopy  12/07/2011    Procedure: CYSTOSCOPY/RETROGRADE/URETEROSCOPY;  Surgeon:  Kathi Ludwig, MD;  [facility]: [facility];  Service: Urology;  Laterality: Bilateral;  . Cystoscopy w/ ureteral stent placement  12/07/2011    Procedure: CYSTOSCOPY WITH STENT REPLACEMENT;  Surgeon: Kathi Ludwig, MD;  Location: [facility];  Service: Urology;  Laterality: Bilateral;  (BIL) JJ STENT EXCHANGE  . Cystoscopy with urethral dilatation  12/07/2011    Procedure: CYSTOSCOPY WITH URETHRAL DILATATION;  Surgeon: Kathi Ludwig, MD;  Location: [facility];  Service: Urology;;    Social History   Social History  . Marital Status: Married    Spouse Name: N/A  . Number of Children: N/A  . Years of Education: N/A   Occupational History  . Not on file.   Social History Main Topics  . Smoking status: Never Smoker   . Smokeless tobacco: Not on file  . Alcohol Use: No  . Drug Use: No  . Sexual Activity: Not on file   Other Topics Concern  . Not on file   Social History Narrative   Family History  Problem Relation Age of Onset  . Family history unknown: Yes      VITAL SIGNS BP 121/56 mmHg  Pulse 65  Temp(Src) 97.6 F (36.4 C) (Oral)  Resp 18  Ht  (1.753 m)  Wt 155 lb 5 oz (70.449 kg)  BMI 22.93 kg/m2  SpO2 97%  Patient's Medications  New Prescriptions   No medications on file  Previous Medications   ALBUTEROL (PROVENTIL) (2.5 MG/3ML) 0.083% NEBULIZER  SOLUTION    Take 2.5 mg by nebulization every 2 (two) hours as needed for wheezing. Reported on 06/08/2015   BUDESONIDE-FORMOTEROL (SYMBICORT) 160-4.5 MCG/ACT INHALER    Inhale 2 puffs into the lungs 2 (two) times daily.   CITALOPRAM (CELEXA) 20 MG TABLET    Take 20 mg by mouth at bedtime. For depression   CRANBERRY 450 MG CAPS    Take 450 mg by mouth 2 (two) times daily.   GUAIFENESIN (MUCINEX) 600 MG 12 HR TABLET    Take 600 mg by mouth 2 (two) times daily.   IPRATROPIUM-ALBUTEROL (DUONEB) 0.5-2.5 (3) MG/3ML SOLN    Take 3 mLs by nebulization every 6 (six) hours as needed. For shortness of breath   MORPHINE  (ROXANOL) 20 MG/ML CONCENTRATED SOLUTION    5 mg every 4 (four) hours as needed for severe pain.   POLYETHYLENE GLYCOL (MIRALAX / GLYCOLAX) PACKET    Take 17 g by mouth daily as needed for moderate constipation. For constipation  Modified Medications   No medications on file  Discontinued Medications   No medications on file     SIGNIFICANT DIAGNOSTIC EXAMS  11-03-14: hgb a1c 7.4; chol 144; ldl 73; trig 193; hdl 32  1-6-109-2-16: wbc 10.8; hgb 9.3; hct 32.1; mcv 87.8 ;plt 227; glucose 194; bun 29.6; creat 2.12; k+ 4.6; na++137; liver normal albumin 3.4 hgb a1c 7.9  04-12-15: wbc 12.1; hgb 7.7; hct 25.4; mcv 83.8; plt 115; glucose 136; bun 22.3; creat 2.01; k+ 4.2; na++137  07-28-15; wbc 5.9; hgb 9.6; hct 29.6; mcv 82.3; plt 179; glucose 184; bun 33.2; creat 2.54; k+ 3.9; na++ 137; liver normal albumin 2.9; hgb a1c 6.4  Chol 129; ldl 61; trig 171; hdl 34     Review of Systems Unable to perform ROS: Dementia    Physical Exam Constitutional: No distress.  Frail  Eyes: Conjunctivae are normal.  Neck: Neck supple. No JVD present. No thyromegaly present.  Cardiovascular: Normal rate, regular rhythm and intact distal pulses.   Respiratory: Effort normal and breath sounds normal. No respiratory distress. He has no wheezes.  GI: Soft. Bowel sounds are normal. He exhibits no distension. There is no tenderness.  Musculoskeletal: He exhibits no edema.  Has right hemiparesis   Lymphadenopathy:    He has no cervical adenopathy.  Neurological: He is alert.  Skin: Skin is warm and dry. He is not diaphoretic.  Psychiatric: He has a normal mood and affect.       ASSESSMENT/ PLAN:  4. Diabetes: hgb a1c is 6.4 is presently not on medications; will monitor       Synthia Innocenteborah Jenavee Laguardia NP [facility]  Contact [phone] Monday through Friday 8am- 5pm  After hours call [phone]

## 2015-10-26 ENCOUNTER — Encounter: Payer: Self-pay | Admitting: Adult Health

## 2015-10-26 ENCOUNTER — Non-Acute Institutional Stay (SKILLED_NURSING_FACILITY): Payer: Medicare Other | Admitting: Adult Health

## 2015-10-26 DIAGNOSIS — J449 Chronic obstructive pulmonary disease, unspecified: Secondary | ICD-10-CM | POA: Diagnosis not present

## 2015-10-26 DIAGNOSIS — N201 Calculus of ureter: Secondary | ICD-10-CM

## 2015-10-26 DIAGNOSIS — R627 Adult failure to thrive: Secondary | ICD-10-CM | POA: Diagnosis not present

## 2015-10-26 DIAGNOSIS — I69320 Aphasia following cerebral infarction: Secondary | ICD-10-CM | POA: Diagnosis not present

## 2015-10-26 DIAGNOSIS — E43 Unspecified severe protein-calorie malnutrition: Secondary | ICD-10-CM

## 2015-10-26 DIAGNOSIS — N184 Chronic kidney disease, stage 4 (severe): Secondary | ICD-10-CM

## 2015-10-26 DIAGNOSIS — F0150 Vascular dementia without behavioral disturbance: Secondary | ICD-10-CM

## 2015-10-26 DIAGNOSIS — I69359 Hemiplegia and hemiparesis following cerebral infarction affecting unspecified side: Secondary | ICD-10-CM

## 2015-10-26 LAB — IRON AND TIBC
Ferritin: 515.7
Folic Acid: 7.6
Iron: 27
TIBC: 204
TRANSFERRIN SATURATION: 13.04
UIBC: 177
VITAMIN B 12: 535

## 2015-10-26 NOTE — Progress Notes (Signed)
Patient ID: Justin Sherman, male   DOB: 1940/10/10, 75 y.o.   MRN: [id number]   Location:   [facility] Room Number: 217-B Place of Service:  SNF (31)   CODE STATUS: DNR  No Known Allergies  Chief Complaint  Patient presents with  . Medical Management of Chronic Issues    Follow up    HPI:  He is a long term resident of this facility being seen for the management of his chronic illnesses. He continues to decline. His renal function is very poor. His family does not want life prolonging treatments to be performed. He is less engaging to his surroundings; is unable to participate in the hpi or ros. There are no nursing concerns at this time.   Past Medical History  Diagnosis Date  . Alterations of sensations, late effect of cerebrovascular disease   . Acute conjunctivitis, unspecified   . Aortic aneurysm of unspecified site without mention of rupture   . Flaccid hemiplegia affecting dominant side (HCC)   . Type II or unspecified type diabetes mellitus without mention of complication, not stated as uncontrolled   . Depressive disorder, not elsewhere classified   . Unspecified essential hypertension   . Obstructive chronic bronchitis with exacerbation (HCC)   . Anxiety state, unspecified   . Esophageal reflux   . Other and unspecified hyperlipidemia   . Pressure ulcer, other site(707.09)   . Dementia   . Chronic kidney disease 08/04/2011    Acute renal failure per note 08/04/2011-Dr. Art Chilton Si  . Stroke (HCC)   . Type 2 diabetes mellitus with neurological manifestations, controlled [facility])     Past Surgical History  Procedure Laterality Date  . Cystoscopy w/ ureteral stent placement  08/07/2011    Procedure: CYSTOSCOPY WITH RETROGRADE PYELOGRAM/URETERAL STENT PLACEMENT;  Surgeon: Kathi Ludwig, MD;  Location: [facility];  Service: Urology;  Laterality: Bilateral;  bilateral stent placement ureterosopy  . Cystoscopy/retrograde/ureteroscopy  12/07/2011    Procedure:  CYSTOSCOPY/RETROGRADE/URETEROSCOPY;  Surgeon: Kathi Ludwig, MD;  Location: [facility];  Service: Urology;  Laterality: Bilateral;  . Cystoscopy w/ ureteral stent placement  12/07/2011    Procedure: CYSTOSCOPY WITH STENT REPLACEMENT;  Surgeon: Kathi Ludwig, MD;  [facility]: [facility];  Service: Urology;  Laterality: Bilateral;  (BIL) JJ STENT EXCHANGE  . Cystoscopy with urethral dilatation  12/07/2011    Procedure: CYSTOSCOPY WITH URETHRAL DILATATION;  Surgeon: Kathi Ludwig, MD;  Location: [facility];  Service: Urology;;    Social History   Social History  . Marital Status: Married    Spouse Name: N/A  . Number of Children: N/A  . Years of Education: N/A   Occupational History  . Not on file.   Social History Main Topics  . Smoking status: Never Smoker   . Smokeless tobacco: Not on file  . Alcohol Use: No  . Drug Use: No  . Sexual Activity: Not on file   Other Topics Concern  . Not on file   Social History Narrative   Family History  Problem Relation Age of Onset  . Family history unknown: Yes      VITAL SIGNS BP 136/62 mmHg  Pulse 64  Temp(Src) 97.9 F (36.6 C) (Oral)  Resp 17  Ht  (1.702 m)  Wt 135 lb 2 oz (61.292 kg)  BMI 21.16 kg/m2  SpO2 98%  Patient's Medications  New Prescriptions   No medications on file  Previous Medications   ALBUTEROL (PROVENTIL) (2.5 MG/3ML) 0.083% NEBULIZER SOLUTION  Take 2.5 mg by nebulization every 2 (two) hours as needed for wheezing. Reported on 06/08/2015   BUDESONIDE-FORMOTEROL (SYMBICORT) 160-4.5 MCG/ACT INHALER    Inhale 2 puffs into the lungs 2 (two) times daily.   CITALOPRAM (CELEXA) 20 MG TABLET    Take 20 mg by mouth at bedtime. For depression   CRANBERRY 450 MG CAPS    Take 450 mg by mouth 2 (two) times daily.   FERROUS SULFATE 325 (65 FE) MG TABLET    Take 325 mg by mouth daily with breakfast.   GUAIFENESIN (MUCINEX) 600 MG 12 HR TABLET    Take 600 mg by mouth 2 (two) times daily.    IPRATROPIUM-ALBUTEROL (DUONEB) 0.5-2.5 (3) MG/3ML SOLN    Take 3 mLs by nebulization every 6 (six) hours as needed. For shortness of breath   MORPHINE (ROXANOL) 20 MG/ML CONCENTRATED SOLUTION    5 mg every 4 (four) hours as needed for severe pain.   POLYETHYLENE GLYCOL (MIRALAX / GLYCOLAX) PACKET    Take 17 g by mouth daily as needed for moderate constipation. For constipation  Modified Medications   No medications on file  Discontinued Medications   No medications on file     SIGNIFICANT DIAGNOSTIC EXAMS  11-03-14: hgb a1c 7.4; chol 144; ldl 73; trig 193; hdl 32  1-0-27: wbc 10.8; hgb 9.3; hct 32.1; mcv 87.8 ;plt 227; glucose 194; bun 29.6; creat 2.12; k+ 4.6; na++137; liver normal albumin 3.4 hgb a1c 7.9  04-12-15: wbc 12.1; hgb 7.7; hct 25.4; mcv 83.8; plt 115; glucose 136; bun 22.3; creat 2.01; k+ 4.2; na++137  07-28-15; wbc 5.9; hgb 9.6; hct 29.6; mcv 82.3; plt 179; glucose 184; bun 33.2; creat 2.54; k+ 3.9; na++ 137; liver normal albumin 2.9; hgb a1c 6.4  Chol 129; ldl 61; trig 171; hdl 34  2-53-66: wbc 8.3; hgb 8.2; hct 25.2; mcv 80.4; plt 263; glucose 89; bun 54.7; creat 4.51; k+ 5.2; na++ 135; liver normal albumin 2.8; vit B 12: 535; iron 27; tibc 204; folic 7.6; retic 1.03; hgb Y4I 6.4     Review of Systems Unable to perform ROS: Dementia    Physical Exam Constitutional: No distress.  Frail  Eyes: Conjunctivae are normal.  Neck: Neck supple. No JVD present. No thyromegaly present.  Cardiovascular: Normal rate, regular rhythm and intact distal pulses.   Respiratory: Effort normal and breath sounds normal. No respiratory distress. He has no wheezes.  GI: Soft. Bowel sounds are normal. He exhibits no distension. There is no tenderness.  Musculoskeletal: He exhibits no edema.  Has right hemiparesis   Lymphadenopathy:    He has no cervical adenopathy.  Neurological: He is alert.  Skin: Skin is warm and dry. He is not diaphoretic.  Psychiatric: He has a normal mood and affect.       ASSESSMENT/ PLAN:   1. Copd: he is presently stable ; he does use 02; will continue symbicort 160/4.5 mcg 2 puffs twice daily;  has  duoneb every 6 hours as needed albuterol neb treatment every 2 hours as needed he does have roxanol 5 mg every 4 hours as needed  2. Anemia: hgb is 9.6; is presently not on medications will monitor   3. Diabetes: hgb a1c is 6.4 is presently not on medications; will monitor  4. Depression: is stable will continue celexa 20 mg daily   5. Constipation; will continue miralax daily as needed   6. 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 CVA; with right hemiparesis:  is neurologically without change  will not make changes will monitor   8. Renal stone: his renal function is declining will monitor   9. Vascular dementia: the stage is advanced;  He is not presently on medications; will not make changes will monitor his status; his most recent weight is 135 pounds.   10. Protein calorie malnutrition: his albumin is 2.8. Will continue supplements per facility protocol. His most recent weight is 135 pounds.       Synthia Innocenteborah Green NP [facility]  Contact [phone] Monday through Friday 8am- 5pm  After hours call [phone]

## 2015-10-27 LAB — BASIC METABOLIC PANEL
BUN: 57 mg/dL — AB (ref 4–21)
CREATININE: 4.8 mg/dL — AB (ref 0.6–1.3)
GLUCOSE: 84 mg/dL
Potassium: 4.7 mmol/L (ref 3.4–5.3)
Sodium: 136 mmol/L — AB (ref 137–147)

## 2015-11-06 DIAGNOSIS — E43 Unspecified severe protein-calorie malnutrition: Secondary | ICD-10-CM | POA: Insufficient documentation

## 2015-11-06 DIAGNOSIS — N184 Chronic kidney disease, stage 4 (severe): Secondary | ICD-10-CM | POA: Insufficient documentation

## 2015-11-14 ENCOUNTER — Encounter: Payer: Self-pay | Admitting: Internal Medicine

## 2015-11-14 ENCOUNTER — Non-Acute Institutional Stay (SKILLED_NURSING_FACILITY): Payer: Medicare Other | Admitting: Internal Medicine

## 2015-11-14 DIAGNOSIS — R627 Adult failure to thrive: Secondary | ICD-10-CM | POA: Diagnosis not present

## 2015-11-14 DIAGNOSIS — R55 Syncope and collapse: Secondary | ICD-10-CM | POA: Diagnosis not present

## 2015-11-14 DIAGNOSIS — F0150 Vascular dementia without behavioral disturbance: Secondary | ICD-10-CM | POA: Diagnosis not present

## 2015-11-14 DIAGNOSIS — E43 Unspecified severe protein-calorie malnutrition: Secondary | ICD-10-CM

## 2015-11-14 DIAGNOSIS — I959 Hypotension, unspecified: Secondary | ICD-10-CM | POA: Diagnosis not present

## 2015-11-14 DIAGNOSIS — R5381 Other malaise: Secondary | ICD-10-CM

## 2015-11-14 NOTE — Progress Notes (Signed)
Patient ID: Justin Sherman, male   DOB: February 14, 1941, 75 y.o.   MRN: [id number]    DATE: 11/14/15  Location:    [facility] Room Number: 217 B Place of Service: SNF (31)   Extended Emergency Contact Information Primary Emergency Contact: [name], [name], [place] [address] [place] Home Phone: [phone] Relation: Son Guardian: [name], [name] Home Phone: [phone] Mobile Phone: [phone] Relation: Legal Guardian  Advanced Directive information Does patient have an advance directive?: Yes, Type of Advance Directive: Out of facility DNR (pink MOST or yellow form), Does patient want to make changes to advanced directive?: No - Patient declined [facility] PT Chief Complaint  Patient presents with  . Acute Visit    Change in condition    HPI:  75 yo male long term resident seen today for vasovagal episode while sitting in dining hall. One episode of hematemesis noted but no coffee ground emesis. Pt became diaphoretic and appeared to have stopped breathing very briefly. He became alert and was taken to his room and put into bed. He c/o CP. Nursing administered dose of roxanol which helped. Hospice notified. Pt denies any CP at this time. No further N/V. No increased dyspnea. No f/c. He is a poor historian due to dementia. Hx obtained from chart  COPD/Chronic respiratory failure - stable on [place] O2 ATC, symbicort 160/4.5 mcg 2 puffs twice daily;  duoneb every 6 hours as needed, albuterol neb treatment every 2 hours as needed and roxanol 5 mg every 4 hours as needed  Hx Anemia - last hgb is 9.6   DM - diet controlled. hgb a1c is 6.4%   Depression - mood stable on celexa 20 mg daily   Constipation - stable on miralax daily as needed    CKD stage IV - worsening. Last Cr 4.51  Hx CVA with right hemiparesis - no recent changes   Hx Renal stone - renal function is declining  Vascular dementia - advanced;  He is not  presently on meds. He gets nutritional supplements   Protein calorie malnutrition - Albumin is 2.8. He gets nutritional supplements per facility protocol.     Past Medical History:  Diagnosis Date  . Acute conjunctivitis, unspecified   . Alterations of sensations, late effect of cerebrovascular disease   . Anxiety state, unspecified   . Aortic aneurysm of unspecified site without mention of rupture   . Chronic kidney disease 08/04/2011   Acute renal failure per note 08/04/2011-Dr. Art Chilton Si  . Dementia   . Depressive disorder, not elsewhere classified   . Esophageal reflux   . Flaccid hemiplegia affecting dominant side (HCC)   . Obstructive chronic bronchitis with exacerbation (HCC)   . Other and unspecified hyperlipidemia   . Pressure ulcer, other site(707.09)   . Stroke (HCC)   . Type 2 diabetes mellitus with neurological manifestations, controlled (HCC)   . Type II or unspecified type diabetes mellitus without mention of complication, not stated as uncontrolled   . Unspecified essential hypertension     Past Surgical History:  Procedure Laterality Date  . CYSTOSCOPY W/ URETERAL STENT PLACEMENT  08/07/2011   Procedure: CYSTOSCOPY WITH RETROGRADE PYELOGRAM/URETERAL STENT PLACEMENT;  Surgeon: Kathi Ludwig, MD;  Location: [facility];  Service: Urology;  Laterality: Bilateral;  bilateral stent placement ureterosopy  . CYSTOSCOPY W/ URETERAL STENT PLACEMENT  12/07/2011   Procedure: CYSTOSCOPY WITH STENT REPLACEMENT;  Surgeon: Kathi Ludwig, MD;  Location: [facility];  Service: Urology;  Laterality: Bilateral;  (BIL) JJ STENT EXCHANGE  . CYSTOSCOPY WITH URETHRAL DILATATION  12/07/2011   Procedure: CYSTOSCOPY WITH URETHRAL DILATATION;  Surgeon: Kathi Ludwig, MD;  Location: [facility];  Service: Urology;;  . CYSTOSCOPY/RETROGRADE/URETEROSCOPY  12/07/2011   Procedure: CYSTOSCOPY/RETROGRADE/URETEROSCOPY;  Surgeon: Kathi Ludwig, MD;  Location: [facility];  Service: Urology;   Laterality: Bilateral;    Patient Care Team: Kirt Boys, DO as PCP - General (Internal Medicine) Sharee Holster, NP as Nurse Practitioner (Nurse Practitioner) [facility] (Sk[facility] Facility)  Social History   Social History  . Marital status: Married    Spouse name: N/A  . Number of children: N/A  . Years of education: N/A   Occupational History  . Not on file.   Social History Main Topics  . Smoking status: Never Smoker  . Smokeless tobacco: Never Used  . Alcohol use No  . Drug use: No  . Sexual activity: Not on file   Other Topics Concern  . Not on file   Social History Narrative  . No narrative on file     reports that he has never smoked. He has never used smokeless tobacco. He reports that he does not drink alcohol or use drugs.  Family History  Problem Relation Age of Onset  . Family history unknown: Yes   No family status information on file.    Immunization History  Administered Date(s) Administered  . Influenza Whole 01/28/2013  . Influenza-Unspecified 01/18/2015  . Pneumococcal-Unspecified 04/30/2011, 03/09/2014    No Known Allergies  Medications: Patient's Medications  New Prescriptions   No medications on file  Previous Medications   ALBUTEROL (PROVENTIL) (2.5 MG/3ML) 0.083% NEBULIZER SOLUTION    Take 2.5 mg by nebulization every 2 (two) hours as needed for wheezing. Reported on 06/08/2015   BUDESONIDE-FORMOTEROL (SYMBICORT) 160-4.5 MCG/ACT INHALER    Inhale 2 puffs into the lungs 2 (two) times daily.   CITALOPRAM (CELEXA) 20 MG TABLET    Take 20 mg by mouth at bedtime. For depression   CRANBERRY 450 MG CAPS    Take 450 mg by mouth 2 (two) times daily.   FERROUS SULFATE 325 (65 FE) MG TABLET    Take 325 mg by mouth daily with breakfast.   GUAIFENESIN (MUCINEX) 600 MG 12 HR TABLET    Take 600 mg by mouth 2 (two) times daily.   IPRATROPIUM-ALBUTEROL (DUONEB) 0.5-2.5 (3) MG/3ML SOLN    Take 3 mLs by nebulization every 6  (six) hours as needed. For shortness of breath   MORPHINE (ROXANOL) 20 MG/ML CONCENTRATED SOLUTION    5 mg every 4 (four) hours as needed for severe pain.   NON FORMULARY    House Supplement three times daily for weight support 120cc   POLYETHYLENE GLYCOL (MIRALAX / GLYCOLAX) PACKET    Take 17 g by mouth daily as needed for moderate constipation. For constipation  Modified Medications   No medications on file  Discontinued Medications   No medications on file    Review of Systems  Unable to perform ROS: Dementia    Vitals:   11/14/15 1447  BP: (!) 98/55  Pulse: 68  Resp: 14  Temp: 98.4 F (36.9 C)  TempSrc: Oral  SpO2: 98%  Weight: 135 lb 3.2 oz (61.3 kg)  Height: 5\' 7"  (1.702 m)   Body mass index is 21.18 kg/m.  Physical Exam  Constitutional: He appears well-developed.  Sitting in bed in NAD, frail  appearing, [place] O2 intact  HENT:  Mouth/Throat: Oropharynx is clear and moist.  MMM  Eyes: Pupils are equal, round, and reactive to light. No scleral icterus.  Neck: Neck supple. Carotid bruit is not present. No thyromegaly present.  Cardiovascular: Normal rate, regular rhythm and intact distal pulses.  Exam reveals no friction rub.   Murmur (1/6 SEM) heard. no distal LE swelling. No calf TTP  Pulmonary/Chest: Effort normal. He has wheezes (right end expiratory). He has no rales. He exhibits no tenderness.  Abdominal: Soft. Bowel sounds are normal. He exhibits no distension, no abdominal bruit, no pulsatile midline mass and no mass. There is no tenderness. There is no rebound and no guarding.  Musculoskeletal: He exhibits edema.  Lymphadenopathy:    He has no cervical adenopathy.  Neurological: He is alert.  Right hemiparesis with flaccid paralysis  Skin: Skin is warm and dry. No rash noted.  Psychiatric: He has a normal mood and affect. His behavior is normal.     Labs reviewed: Nursing Home on 11/14/2015  Component Date Value Ref Range Status  . Glucose 10/27/2015 84   mg/dL Final  . BUN 40/98/1191 57* 4 - 21 mg/dL Final  . Creatinine 47/82/9562 4.8* 0.6 - 1.3 mg/dL Final  . Potassium 13/11/6576 4.7  3.4 - 5.3 mmol/L Final  . Sodium 10/27/2015 136* 137 - 147 mmol/L Final  Nursing Home on 10/26/2015  Component Date Value Ref Range Status  . Hemoglobin 10/19/2015 8.2* 13.5 - 17.5 g/dL Final  . HCT 46/96/2952 25* 41 - 53 % Final  . Platelets 10/19/2015 263  150 - 399 K/L Final  . WBC 10/19/2015 8.3  10^3/mL Final  . Glucose 10/19/2015 89  mg/dL Final  . BUN 84/13/2440 55* 4 - 21 mg/dL Final  . Creatinine 02/03/2535 4.5* 0.6 - 1.3 mg/dL Final  . Potassium 64/40/3474 5.2  3.4 - 5.3 mmol/L Final  . Sodium 10/19/2015 135* 137 - 147 mmol/L Final  . Alkaline Phosphatase 10/19/2015 60  25 - 125 U/L Final  . ALT 10/19/2015 5* 10 - 40 U/L Final  . AST 10/19/2015 5* 14 - 40 U/L Final  . Bilirubin, Total 10/19/2015 0.2  mg/dL Final  . Hemoglobin Q5Z 10/19/2015 6.4   Final  . Iron 10/26/2015 27   Final  . UIBC 10/26/2015 177   Final  . TIBC 10/26/2015 204   Final  . TRANSFERRIN SATURATION 10/26/2015 13.04   Final  . Ferritin 10/26/2015 515.70   Final  . Folic Acid 10/26/2015 7.6   Final  . Vitamin B-12 10/26/2015 535   Final  Nursing Home on 10/19/2015  Component Date Value Ref Range Status  . Microalb/Creat Ratio 10/19/2015 556.6   Final  . Microalb, Ur 10/19/2015 19.1   Final  . Creatinine, Urine 10/19/2015 34   Final  Nursing Home on 08/22/2015  Component Date Value Ref Range Status  . HM Diabetic Foot Exam 08/09/2015 Completed by [facility]   Final  . HM Diabetic Eye Exam 07/26/2015 No Retinopathy  No Retinopathy Final  . Hemoglobin 07/28/2015 9.6* 13.5 - 17.5 g/dL Final  . HCT 56/38/7564 30* 41 - 53 % Final  . Platelets 07/28/2015 179  150 - 399 K/L Final  . WBC 07/28/2015 5.9  10^3/mL Final  . Glucose 07/28/2015 183  mg/dL Final  . BUN 33/29/5188 33* 4 - 21 mg/dL Final  . Creatinine 41/66/0630 2.5* 0.6 - 1.3 mg/dL  Final  . Potassium 16/04/930 3.9  3.4 -  5.3 mmol/L Final  . Sodium 07/28/2015 137  137 - 147 mmol/L Final  . Triglycerides 07/28/2015 171* 40 - 160 mg/dL Final  . Cholesterol 16/10/960404/20/2017 129  0 - 200 mg/dL Final  . HDL 54/09/811904/20/2017 34* 35 - 70 mg/dL Final  . LDL Cholesterol 07/28/2015 61  mg/dL Final  . Alkaline Phosphatase 07/28/2015 66  25 - 125 U/L Final  . AST 07/28/2015 10* 14 - 40 U/L Final  . Hemoglobin A1C 07/28/2015 6.4   Final    No results found.   Assessment/Plan   ICD-9-CM ICD-10-CM   1. Vasovagal episode likely due to #5  780.2 R55   2. Hypotension, unspecified hypotension type probably due to roxanol admin 458.9 I95.9   3. FTT (failure to thrive) in adult 783.7 R62.7   4. Protein-calorie malnutrition, severe (HCC) 262 E43   5. Physical deconditioning 799.3 R53.81   6. Vascular dementia without behavioral disturbance 290.40 F01.50     Check vital signs qshift x 2 days  T/c CXR if wheezing worsens  Push fluids  Change positions slowly  hospice to follow  Cont current meds as ordered  Cont nutritional supplements as ordered  Will follow  Sabine Tenenbaum S. Ancil Linseyarter, D. O., F. A. C. O. I.  [facility] [address] [place], [place] [address] [phone] Cell (Monday-Friday 8 AM - 5 PM) [phone] After 5 PM and follow prompts

## 2015-12-09 DEATH — deceased

## 2016-01-27 IMAGING — DX DG CHEST 1V PORT
1 series · 1 of 1 positions shown · non-contrast
Comparison: 08/17/2012

CLINICAL DATA: Nausea, vomiting.  Altered mental status.

EXAM:
PORTABLE CHEST 1 VIEW

[chest ap]
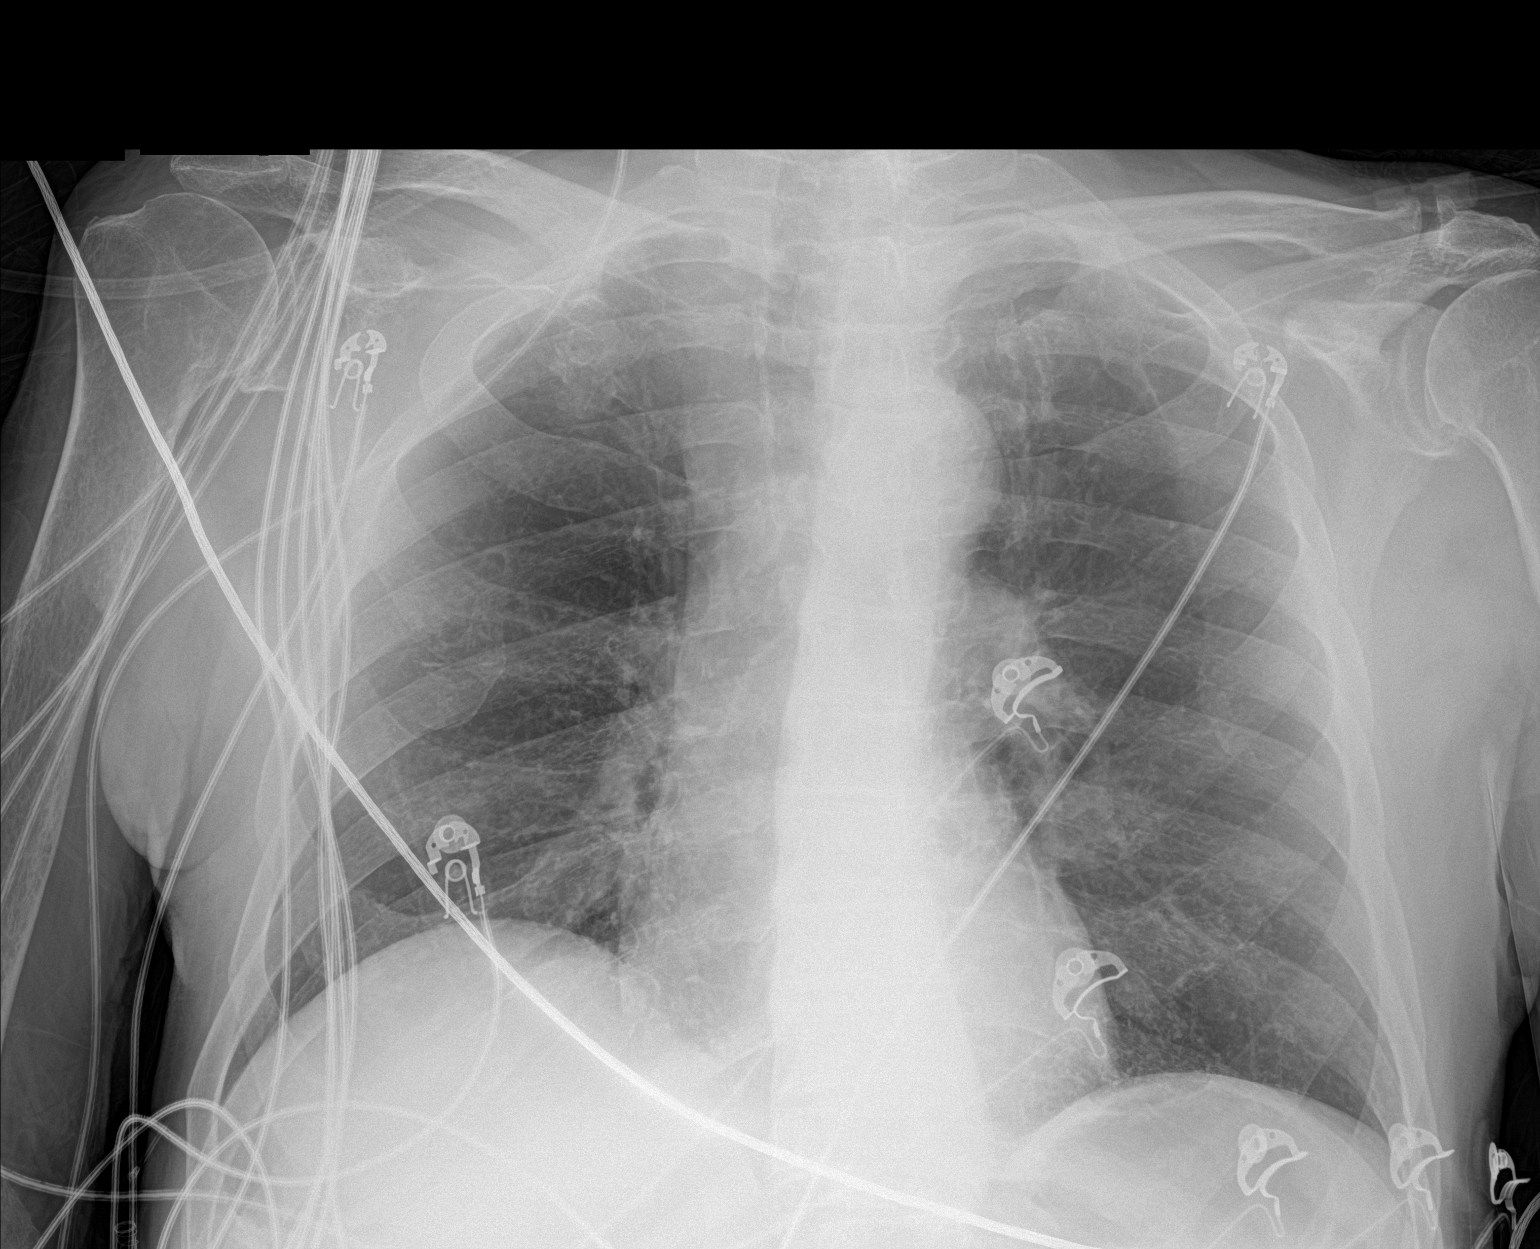

[1 of 1 positions shown; findings below may reference images not displayed]

FINDINGS: The heart size and mediastinal contours are within normal limits.
Both lungs are clear. The visualized skeletal structures are
unremarkable.
IMPRESSION: No active disease.
